# Patient Record
Sex: Female | Born: 1935 | Race: White | Hispanic: No | Smoking: Current every day smoker
Health system: Southern US, Community
[De-identification: ages and names within clinical notes are randomized; demographics above are authoritative.]

## PROBLEM LIST (undated history)

## (undated) DIAGNOSIS — F32A Depression, unspecified: Secondary | ICD-10-CM

## (undated) DIAGNOSIS — K219 Gastro-esophageal reflux disease without esophagitis: Secondary | ICD-10-CM

## (undated) DIAGNOSIS — Z91148 Patient's other noncompliance with medication regimen for other reason: Secondary | ICD-10-CM

## (undated) DIAGNOSIS — Z9114 Patient's other noncompliance with medication regimen: Secondary | ICD-10-CM

## (undated) DIAGNOSIS — I219 Acute myocardial infarction, unspecified: Secondary | ICD-10-CM

## (undated) DIAGNOSIS — Z9981 Dependence on supplemental oxygen: Secondary | ICD-10-CM

## (undated) DIAGNOSIS — R51 Headache: Secondary | ICD-10-CM

## (undated) DIAGNOSIS — J449 Chronic obstructive pulmonary disease, unspecified: Secondary | ICD-10-CM

## (undated) DIAGNOSIS — F329 Major depressive disorder, single episode, unspecified: Secondary | ICD-10-CM

## (undated) DIAGNOSIS — K922 Gastrointestinal hemorrhage, unspecified: Secondary | ICD-10-CM

## (undated) DIAGNOSIS — I639 Cerebral infarction, unspecified: Secondary | ICD-10-CM

## (undated) DIAGNOSIS — N200 Calculus of kidney: Secondary | ICD-10-CM

## (undated) DIAGNOSIS — K649 Unspecified hemorrhoids: Secondary | ICD-10-CM

## (undated) DIAGNOSIS — M159 Polyosteoarthritis, unspecified: Secondary | ICD-10-CM

## (undated) DIAGNOSIS — I5042 Chronic combined systolic (congestive) and diastolic (congestive) heart failure: Secondary | ICD-10-CM

## (undated) DIAGNOSIS — N184 Chronic kidney disease, stage 4 (severe): Secondary | ICD-10-CM

## (undated) DIAGNOSIS — Z8711 Personal history of peptic ulcer disease: Secondary | ICD-10-CM

## (undated) DIAGNOSIS — C801 Malignant (primary) neoplasm, unspecified: Secondary | ICD-10-CM

## (undated) DIAGNOSIS — D649 Anemia, unspecified: Secondary | ICD-10-CM

## (undated) DIAGNOSIS — M1990 Unspecified osteoarthritis, unspecified site: Secondary | ICD-10-CM

## (undated) DIAGNOSIS — I10 Essential (primary) hypertension: Secondary | ICD-10-CM

## (undated) DIAGNOSIS — I2510 Atherosclerotic heart disease of native coronary artery without angina pectoris: Secondary | ICD-10-CM

## (undated) HISTORY — DX: Personal history of peptic ulcer disease: Z87.11

## (undated) HISTORY — PX: CHOLECYSTECTOMY: SHX55

## (undated) HISTORY — DX: Gastrointestinal hemorrhage, unspecified: K92.2

## (undated) HISTORY — DX: Polyosteoarthritis, unspecified: M15.9

## (undated) HISTORY — PX: ABDOMINAL HYSTERECTOMY: SHX81

## (undated) HISTORY — PX: APPENDECTOMY: SHX54

## (undated) HISTORY — DX: Calculus of kidney: N20.0

## (undated) HISTORY — PX: TONSILLECTOMY: SUR1361

## (undated) HISTORY — PX: COLON SURGERY: SHX602

## (undated) HISTORY — PX: EYE SURGERY: SHX253

---

## 1987-06-18 HISTORY — PX: MELANOMA EXCISION: SHX5266

## 2006-10-10 ENCOUNTER — Emergency Department (HOSPITAL_COMMUNITY): Admission: EM | Admit: 2006-10-10 | Discharge: 2006-10-11 | Payer: Self-pay | Admitting: Emergency Medicine

## 2006-10-13 ENCOUNTER — Emergency Department (HOSPITAL_COMMUNITY): Admission: EM | Admit: 2006-10-13 | Discharge: 2006-10-13 | Payer: Self-pay | Admitting: Family Medicine

## 2006-10-20 ENCOUNTER — Ambulatory Visit: Payer: Self-pay | Admitting: Internal Medicine

## 2006-10-21 ENCOUNTER — Encounter (HOSPITAL_BASED_OUTPATIENT_CLINIC_OR_DEPARTMENT_OTHER): Admission: RE | Admit: 2006-10-21 | Discharge: 2006-11-18 | Payer: Self-pay | Admitting: Surgery

## 2006-10-23 ENCOUNTER — Ambulatory Visit (HOSPITAL_COMMUNITY): Admission: RE | Admit: 2006-10-23 | Discharge: 2006-10-23 | Payer: Self-pay | Admitting: Surgery

## 2007-01-03 ENCOUNTER — Inpatient Hospital Stay (HOSPITAL_COMMUNITY): Admission: EM | Admit: 2007-01-03 | Discharge: 2007-01-08 | Payer: Self-pay | Admitting: Emergency Medicine

## 2007-01-05 ENCOUNTER — Encounter (INDEPENDENT_AMBULATORY_CARE_PROVIDER_SITE_OTHER): Payer: Self-pay | Admitting: Internal Medicine

## 2007-01-05 ENCOUNTER — Ambulatory Visit: Payer: Self-pay | Admitting: *Deleted

## 2007-01-21 ENCOUNTER — Ambulatory Visit: Payer: Self-pay | Admitting: Cardiology

## 2008-01-07 IMAGING — CT CT HEAD W/O CM
1 of 2 series · 15 of 30 positions shown, 19 images · IV contrast (agent unspecified)
Comparison: No prior studies are available for comparison.

CLINICAL DATA: Knot on the right side of the head. Very tender.
 HEAD CT WITHOUT CONTRAST:
TECHNIQUE: Contiguous axial images were obtained from the base of the skull through the vertex according to standard protocol without contrast.

[Series 3: recon 2: brain · axial · 0.47mm/px · z∈[+177,+331]mm · 15 of 64 slices shown, 19 images]
[im 4/64  brain]
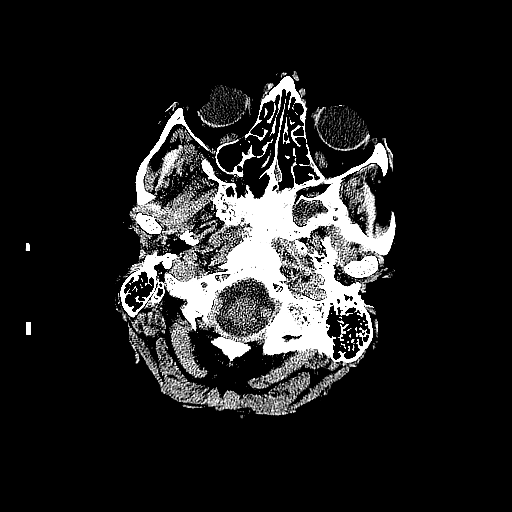
[im 4/64  bone]
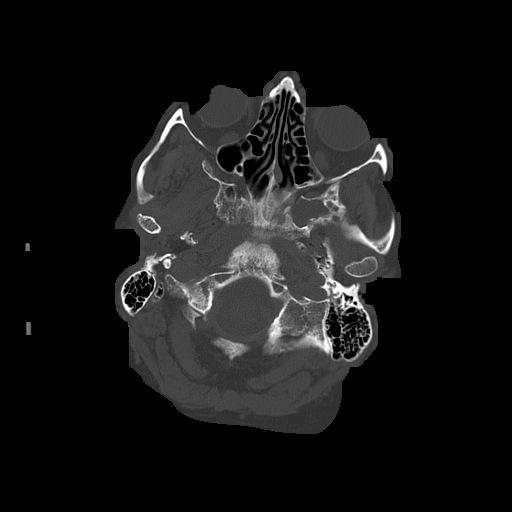
[im 7/64  brain]
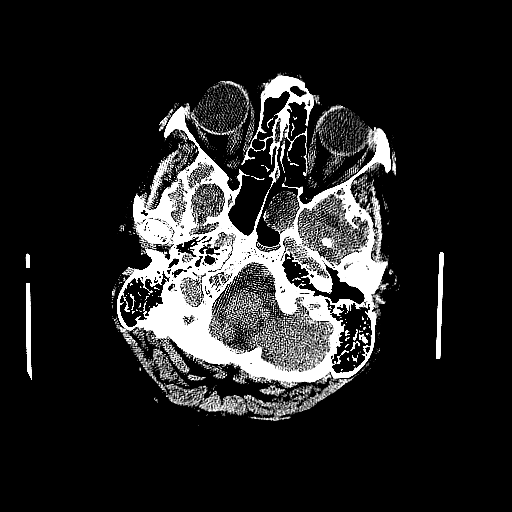
[im 14/64  brain]
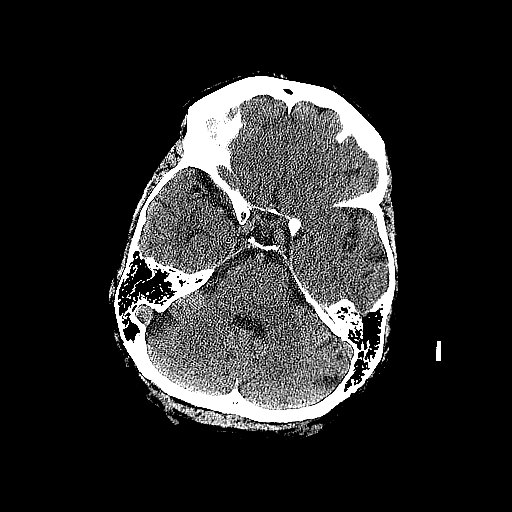
[im 17/64  brain]
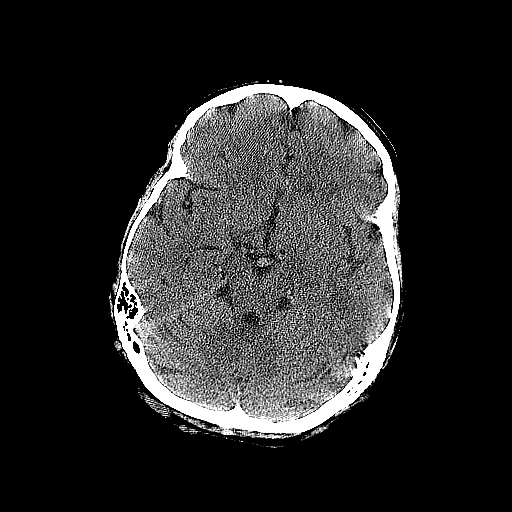
[im 20/64  brain]
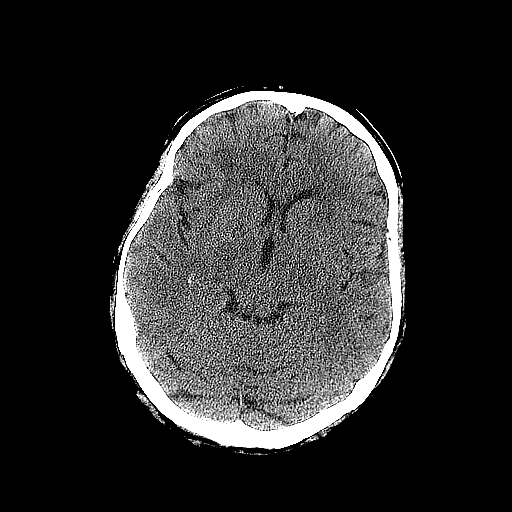
[im 20/64  bone]
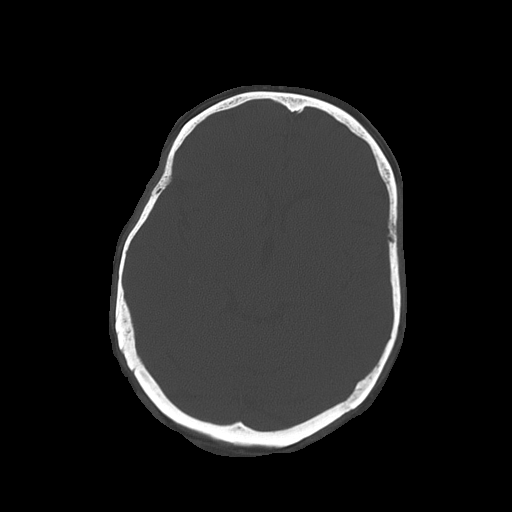
[im 24/64  brain]
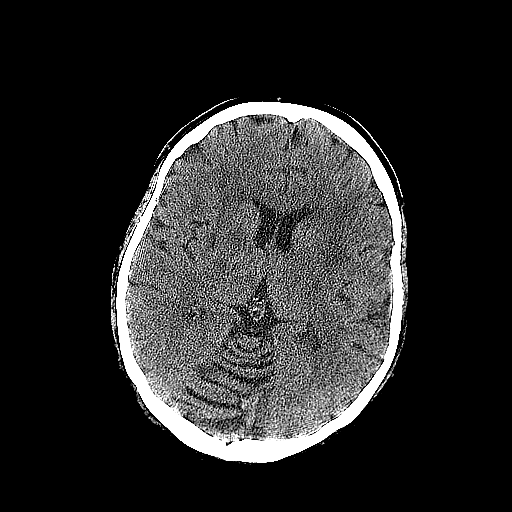
[im 27/64  brain]
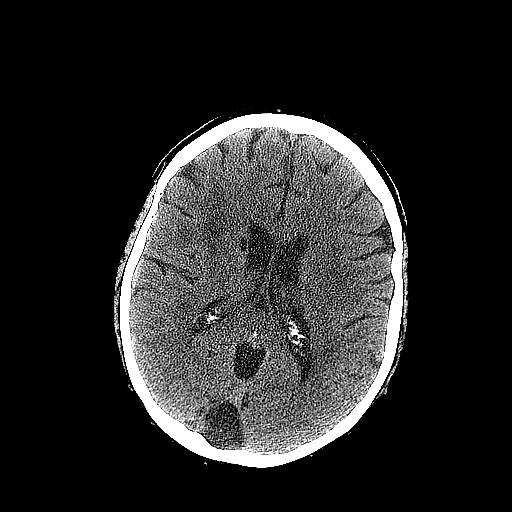
[im 34/64  brain]
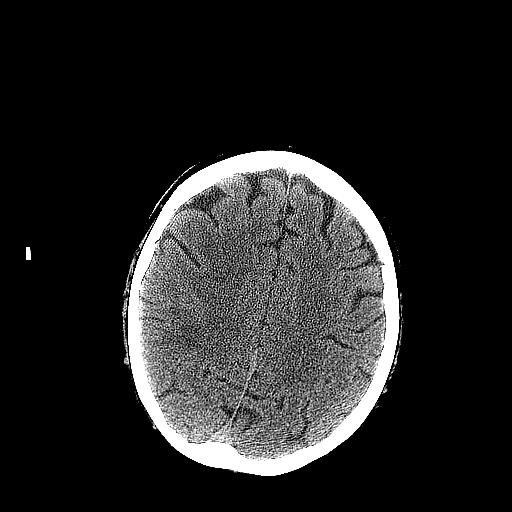
[im 37/64  brain]
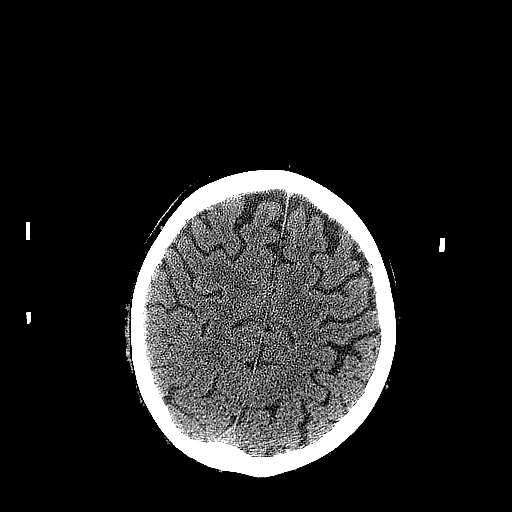
[im 37/64  bone]
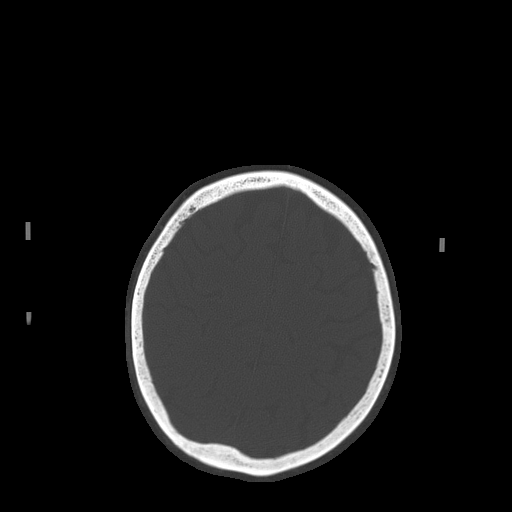
[im 40/64  brain]
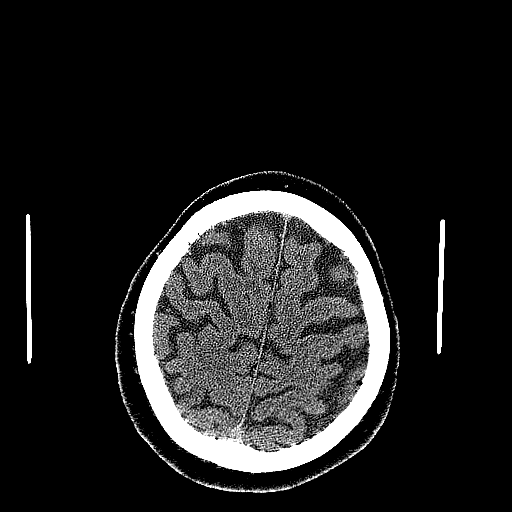
[im 44/64  brain]
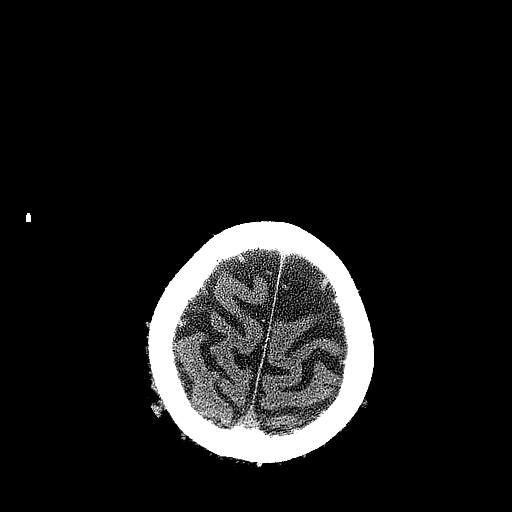
[im 47/64  brain]
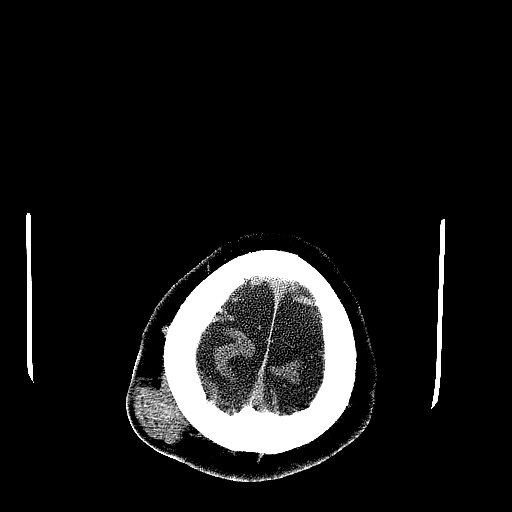
[im 54/64  brain]
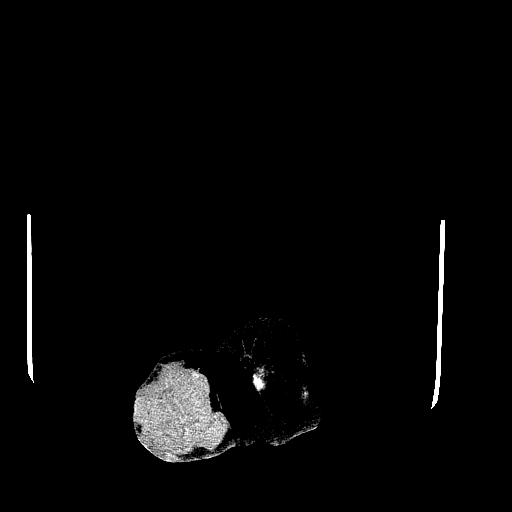
[im 54/64  bone]
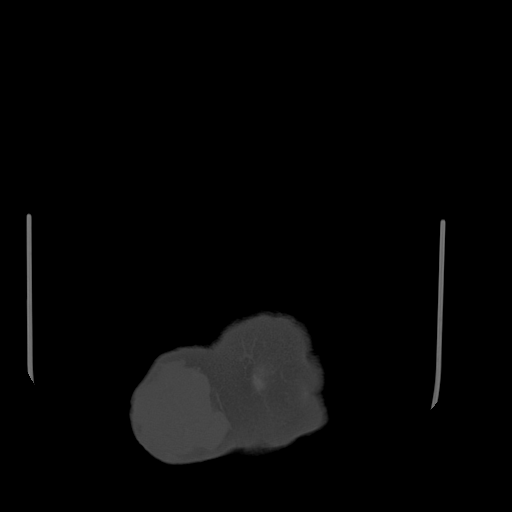
[im 57/64  brain]
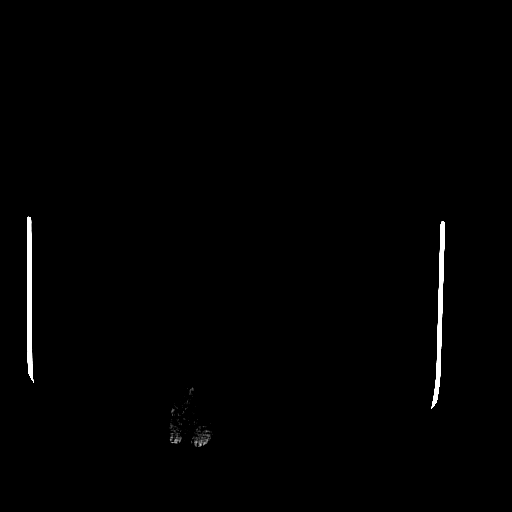
[im 60/64  brain]
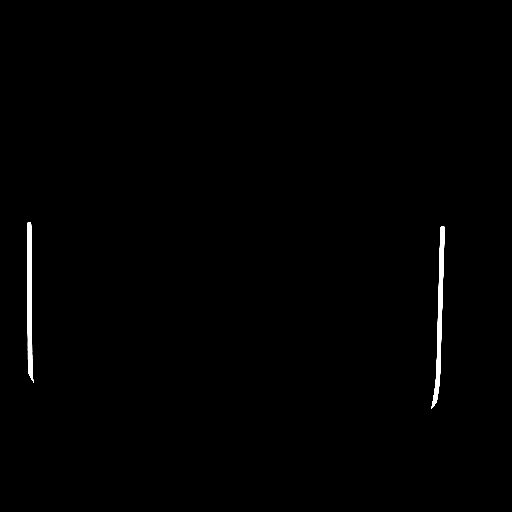

[15 of 30 positions shown; findings below may reference images not displayed]

FINDINGS: There is a 5 cm soft tissue density mass in the scalp over the right posterior parietal region. This appears to be fairly homogeneous. The density is that of soft tissue density and not fluid. There is no sign of internal necrosis. This has a broad surface abutting the skull, but I do not see any abnormality of the skull itself. There are some lymph nodes in the subcutaneous fat of the occipital regions, more on the right than the left. 
 The brain stem and cerebellum are normal. There are old strokes in the body of the caudate on the right and in the right frontal white matter.  There are some minor chronic small vessel changes elsewhere in the hemispheric white matter. There is an old lacune in the right thalamus. No sign of recent intracranial insult. The sinuses are clear.
IMPRESSION: 1.  5 cm soft tissue mass in the subcutaneous tissues of the right posterior parietal region. This is consistent with a neoplastic mass. There is a broad surface along the calvarium, but no sign of actual calvarial involvement. 
 2.  Prominent occipital nodes, more on the right than the left. 
 3.  Chronic small vessel insults affecting the brain. No identifiably acute bony lesion.

## 2010-06-28 ENCOUNTER — Ambulatory Visit: Admit: 2010-06-28 | Payer: Self-pay | Admitting: Internal Medicine

## 2010-07-15 ENCOUNTER — Inpatient Hospital Stay (HOSPITAL_COMMUNITY)
Admission: EM | Admit: 2010-07-15 | Discharge: 2010-07-19 | DRG: 065 | Disposition: A | Discharge status: Home / Self Care | Payer: MEDICARE | Attending: Family Medicine | Admitting: Family Medicine

## 2010-07-15 DIAGNOSIS — E669 Obesity, unspecified: Secondary | ICD-10-CM | POA: Diagnosis present

## 2010-07-15 DIAGNOSIS — I2510 Atherosclerotic heart disease of native coronary artery without angina pectoris: Secondary | ICD-10-CM | POA: Diagnosis present

## 2010-07-15 DIAGNOSIS — Z9861 Coronary angioplasty status: Secondary | ICD-10-CM

## 2010-07-15 DIAGNOSIS — D126 Benign neoplasm of colon, unspecified: Secondary | ICD-10-CM | POA: Diagnosis present

## 2010-07-15 DIAGNOSIS — F17200 Nicotine dependence, unspecified, uncomplicated: Secondary | ICD-10-CM | POA: Diagnosis present

## 2010-07-15 DIAGNOSIS — K219 Gastro-esophageal reflux disease without esophagitis: Secondary | ICD-10-CM | POA: Diagnosis present

## 2010-07-15 DIAGNOSIS — Z8673 Personal history of transient ischemic attack (TIA), and cerebral infarction without residual deficits: Secondary | ICD-10-CM

## 2010-07-15 DIAGNOSIS — I619 Nontraumatic intracerebral hemorrhage, unspecified: Principal | ICD-10-CM | POA: Diagnosis present

## 2010-07-15 DIAGNOSIS — Z85828 Personal history of other malignant neoplasm of skin: Secondary | ICD-10-CM

## 2010-07-15 DIAGNOSIS — M069 Rheumatoid arthritis, unspecified: Secondary | ICD-10-CM | POA: Diagnosis present

## 2010-07-15 DIAGNOSIS — N179 Acute kidney failure, unspecified: Secondary | ICD-10-CM | POA: Diagnosis present

## 2010-07-15 DIAGNOSIS — K922 Gastrointestinal hemorrhage, unspecified: Secondary | ICD-10-CM | POA: Diagnosis present

## 2010-07-15 DIAGNOSIS — D62 Acute posthemorrhagic anemia: Secondary | ICD-10-CM | POA: Diagnosis present

## 2010-07-15 DIAGNOSIS — I10 Essential (primary) hypertension: Secondary | ICD-10-CM | POA: Diagnosis present

## 2010-07-15 DIAGNOSIS — Z7982 Long term (current) use of aspirin: Secondary | ICD-10-CM

## 2010-07-15 DIAGNOSIS — I498 Other specified cardiac arrhythmias: Secondary | ICD-10-CM | POA: Diagnosis present

## 2010-07-15 DIAGNOSIS — I672 Cerebral atherosclerosis: Secondary | ICD-10-CM | POA: Diagnosis present

## 2010-07-15 DIAGNOSIS — K5730 Diverticulosis of large intestine without perforation or abscess without bleeding: Secondary | ICD-10-CM | POA: Diagnosis present

## 2010-07-15 DIAGNOSIS — Z9849 Cataract extraction status, unspecified eye: Secondary | ICD-10-CM

## 2010-07-15 LAB — CBC
HCT: 38 % (ref 36.0–46.0)
HCT: 33.9 % — ABNORMAL LOW (ref 36.0–46.0)
Hemoglobin: 11.6 g/dL — ABNORMAL LOW (ref 12.0–15.0)
MCHC: 30.5 g/dL (ref 30.0–36.0)
MCHC: 31 g/dL (ref 30.0–36.0)
MCV: 84.3 fL (ref 78.0–100.0)
Platelets: 226 10*3/uL (ref 150–400)
RBC: 4.5 MIL/uL (ref 3.87–5.11)
RDW: 15.2 % (ref 11.5–15.5)
WBC: 9.6 10*3/uL (ref 4.0–10.5)
WBC: 8 10*3/uL (ref 4.0–10.5)

## 2010-07-15 LAB — DIFFERENTIAL
Basophils Absolute: 0 10*3/uL (ref 0.0–0.1)
Basophils Absolute: 0 10*3/uL (ref 0.0–0.1)
Basophils Relative: 0 % (ref 0–1)
Eosinophils Absolute: 0.1 10*3/uL (ref 0.0–0.7)
Eosinophils Relative: 2 % (ref 0–5)
Lymphocytes Relative: 11 % — ABNORMAL LOW (ref 12–46)
Lymphocytes Relative: 17 % (ref 12–46)
Lymphs Abs: 1.3 10*3/uL (ref 0.7–4.0)
Monocytes Absolute: 0.6 10*3/uL (ref 0.1–1.0)
Monocytes Absolute: 0.4 10*3/uL (ref 0.1–1.0)
Neutro Abs: 7.8 10*3/uL — ABNORMAL HIGH (ref 1.7–7.7)
Neutrophils Relative %: 81 % — ABNORMAL HIGH (ref 43–77)

## 2010-07-15 LAB — BASIC METABOLIC PANEL
CO2: 24 mEq/L (ref 19–32)
Chloride: 102 mEq/L (ref 96–112)
Potassium: 3.9 mEq/L (ref 3.5–5.1)
Sodium: 136 mEq/L (ref 135–145)

## 2010-07-15 LAB — PROTIME-INR
INR: 0.98 (ref 0.00–1.49)
Prothrombin Time: 13.2 seconds (ref 11.6–15.2)

## 2010-07-15 LAB — ABO/RH: ABO/RH(D): A POS

## 2010-07-16 DIAGNOSIS — K922 Gastrointestinal hemorrhage, unspecified: Secondary | ICD-10-CM

## 2010-07-16 DIAGNOSIS — I6789 Other cerebrovascular disease: Secondary | ICD-10-CM

## 2010-07-16 LAB — HEPATIC FUNCTION PANEL
ALT: 50 U/L — ABNORMAL HIGH (ref 0–35)
Bilirubin, Direct: 0.2 mg/dL (ref 0.0–0.3)
Indirect Bilirubin: 0.3 mg/dL (ref 0.3–0.9)
Total Protein: 5.1 g/dL — ABNORMAL LOW (ref 6.0–8.3)

## 2010-07-16 LAB — DIFFERENTIAL
Eosinophils Absolute: 0.3 10*3/uL (ref 0.0–0.7)
Eosinophils Absolute: 0.3 10*3/uL (ref 0.0–0.7)
Eosinophils Relative: 4 % (ref 0–5)
Lymphocytes Relative: 23 % (ref 12–46)
Lymphocytes Relative: 20 % (ref 12–46)
Lymphs Abs: 1.7 10*3/uL (ref 0.7–4.0)
Lymphs Abs: 1.6 10*3/uL (ref 0.7–4.0)
Monocytes Absolute: 0.6 10*3/uL (ref 0.1–1.0)
Monocytes Relative: 7 % (ref 3–12)
Neutro Abs: 5.5 10*3/uL (ref 1.7–7.7)
Neutrophils Relative %: 69 % (ref 43–77)

## 2010-07-16 LAB — CBC
HCT: 30.5 % — ABNORMAL LOW (ref 36.0–46.0)
HCT: 30.3 % — ABNORMAL LOW (ref 36.0–46.0)
Hemoglobin: 9.4 g/dL — ABNORMAL LOW (ref 12.0–15.0)
MCH: 25.6 pg — ABNORMAL LOW (ref 26.0–34.0)
MCH: 26.2 pg (ref 26.0–34.0)
MCHC: 30.5 g/dL (ref 30.0–36.0)
MCV: 84 fL (ref 78.0–100.0)
MCV: 84.4 fL (ref 78.0–100.0)
Platelets: 236 10*3/uL (ref 150–400)
RBC: 3.59 MIL/uL — ABNORMAL LOW (ref 3.87–5.11)
RDW: 15.4 % (ref 11.5–15.5)

## 2010-07-16 LAB — IRON: Iron: 35 ug/dL — ABNORMAL LOW (ref 42–135)

## 2010-07-16 LAB — BASIC METABOLIC PANEL
BUN: 32 mg/dL — ABNORMAL HIGH (ref 6–23)
Calcium: 7.8 mg/dL — ABNORMAL LOW (ref 8.4–10.5)
Creatinine, Ser: 2.55 mg/dL — ABNORMAL HIGH (ref 0.4–1.2)
GFR calc non Af Amer: 18 mL/min — ABNORMAL LOW (ref >60)

## 2010-07-17 DIAGNOSIS — K922 Gastrointestinal hemorrhage, unspecified: Secondary | ICD-10-CM

## 2010-07-17 DIAGNOSIS — I6350 Cerebral infarction due to unspecified occlusion or stenosis of unspecified cerebral artery: Secondary | ICD-10-CM

## 2010-07-17 DIAGNOSIS — D62 Acute posthemorrhagic anemia: Secondary | ICD-10-CM

## 2010-07-17 LAB — PREPARE RBC (CROSSMATCH)

## 2010-07-17 LAB — URINALYSIS, ROUTINE W REFLEX MICROSCOPIC
Bilirubin Urine: NEGATIVE
Ketones, ur: NEGATIVE mg/dL
Nitrite: NEGATIVE
Specific Gravity, Urine: 1.016 (ref 1.005–1.030)
pH: 5 (ref 5.0–8.0)

## 2010-07-17 LAB — CBC
HCT: 27.9 % — ABNORMAL LOW (ref 36.0–46.0)
MCH: 26.1 pg (ref 26.0–34.0)
MCH: 26.1 pg (ref 26.0–34.0)
MCHC: 31.6 g/dL (ref 30.0–36.0)
MCV: 83.8 fL (ref 78.0–100.0)
Platelets: 209 10*3/uL (ref 150–400)
Platelets: 241 10*3/uL (ref 150–400)
RDW: 15.3 % (ref 11.5–15.5)
RDW: 15.3 % (ref 11.5–15.5)

## 2010-07-17 LAB — DIFFERENTIAL
Basophils Absolute: 0 10*3/uL (ref 0.0–0.1)
Basophils Relative: 0 % (ref 0–1)
Eosinophils Absolute: 0.4 10*3/uL (ref 0.0–0.7)
Eosinophils Absolute: 0.3 10*3/uL (ref 0.0–0.7)
Eosinophils Relative: 5 % (ref 0–5)
Eosinophils Relative: 4 % (ref 0–5)
Lymphocytes Relative: 22 % (ref 12–46)
Lymphs Abs: 1.6 10*3/uL (ref 0.7–4.0)
Monocytes Absolute: 0.6 10*3/uL (ref 0.1–1.0)
Monocytes Absolute: 0.6 10*3/uL (ref 0.1–1.0)
Monocytes Relative: 8 % (ref 3–12)
Monocytes Relative: 7 % (ref 3–12)
Neutro Abs: 5.4 10*3/uL (ref 1.7–7.7)

## 2010-07-17 LAB — URINE MICROSCOPIC-ADD ON

## 2010-07-17 LAB — BASIC METABOLIC PANEL
BUN: 30 mg/dL — ABNORMAL HIGH (ref 6–23)
Creatinine, Ser: 2.59 mg/dL — ABNORMAL HIGH (ref 0.4–1.2)
GFR calc non Af Amer: 18 mL/min — ABNORMAL LOW (ref >60)
Glucose, Bld: 99 mg/dL (ref 70–99)

## 2010-07-17 LAB — SODIUM, URINE, RANDOM: Sodium, Ur: 34 mEq/L

## 2010-07-17 LAB — CREATININE, URINE, RANDOM: Creatinine, Urine: 93.9 mg/dL

## 2010-07-17 LAB — TSH: TSH: 0.998 u[IU]/mL (ref 0.350–4.500)

## 2010-07-18 ENCOUNTER — Encounter: Payer: Self-pay | Admitting: Gastroenterology

## 2010-07-18 DIAGNOSIS — D126 Benign neoplasm of colon, unspecified: Secondary | ICD-10-CM

## 2010-07-18 DIAGNOSIS — I6789 Other cerebrovascular disease: Secondary | ICD-10-CM

## 2010-07-18 DIAGNOSIS — K625 Hemorrhage of anus and rectum: Secondary | ICD-10-CM

## 2010-07-18 DIAGNOSIS — K922 Gastrointestinal hemorrhage, unspecified: Secondary | ICD-10-CM

## 2010-07-18 DIAGNOSIS — K5731 Diverticulosis of large intestine without perforation or abscess with bleeding: Secondary | ICD-10-CM

## 2010-07-18 HISTORY — PX: COLONOSCOPY: SHX174

## 2010-07-18 LAB — DIFFERENTIAL
Basophils Absolute: 0 10*3/uL (ref 0.0–0.1)
Basophils Absolute: 0 10*3/uL (ref 0.0–0.1)
Basophils Relative: 0 % (ref 0–1)
Basophils Relative: 0 % (ref 0–1)
Eosinophils Absolute: 0.4 10*3/uL (ref 0.0–0.7)
Eosinophils Absolute: 0.4 10*3/uL (ref 0.0–0.7)
Lymphocytes Relative: 19 % (ref 12–46)
Monocytes Relative: 9 % (ref 3–12)
Monocytes Relative: 6 % (ref 3–12)
Neutro Abs: 4.3 10*3/uL (ref 1.7–7.7)
Neutrophils Relative %: 62 % (ref 43–77)
Neutrophils Relative %: 70 % (ref 43–77)

## 2010-07-18 LAB — CBC
Hemoglobin: 10 g/dL — ABNORMAL LOW (ref 12.0–15.0)
Hemoglobin: 11.2 g/dL — ABNORMAL LOW (ref 12.0–15.0)
MCH: 27 pg (ref 26.0–34.0)
Platelets: 214 10*3/uL (ref 150–400)
Platelets: 246 10*3/uL (ref 150–400)
RBC: 3.71 MIL/uL — ABNORMAL LOW (ref 3.87–5.11)
RBC: 4.21 MIL/uL (ref 3.87–5.11)
WBC: 6.9 10*3/uL (ref 4.0–10.5)
WBC: 7.6 10*3/uL (ref 4.0–10.5)

## 2010-07-18 LAB — TYPE AND SCREEN
ABO/RH(D): A POS
Antibody Screen: NEGATIVE
Unit division: 0

## 2010-07-18 LAB — BASIC METABOLIC PANEL
CO2: 25 mEq/L (ref 19–32)
Chloride: 108 mEq/L (ref 96–112)
Creatinine, Ser: 2.34 mg/dL — ABNORMAL HIGH (ref 0.4–1.2)
GFR calc Af Amer: 25 mL/min — ABNORMAL LOW (ref >60)
Sodium: 142 mEq/L (ref 135–145)

## 2010-07-19 ENCOUNTER — Other Ambulatory Visit: Payer: Self-pay | Admitting: Gastroenterology

## 2010-07-19 DIAGNOSIS — K5731 Diverticulosis of large intestine without perforation or abscess with bleeding: Secondary | ICD-10-CM

## 2010-07-19 LAB — DIFFERENTIAL
Lymphocytes Relative: 19 % (ref 12–46)
Lymphs Abs: 1.5 10*3/uL (ref 0.7–4.0)
Monocytes Relative: 7 % (ref 3–12)
Neutrophils Relative %: 69 % (ref 43–77)

## 2010-07-19 LAB — CBC
HCT: 32.6 % — ABNORMAL LOW (ref 36.0–46.0)
Hemoglobin: 10.2 g/dL — ABNORMAL LOW (ref 12.0–15.0)
MCV: 83.6 fL (ref 78.0–100.0)
RBC: 3.9 MIL/uL (ref 3.87–5.11)
WBC: 7.6 10*3/uL (ref 4.0–10.5)

## 2010-07-21 NOTE — Consult Note (Signed)
NAMEJAVONDA, SUH NO.:  [id number]  MEDICAL RECORD NO.:  [id number]          PATIENT TYPE:  INP  LOCATION:  3019                         FACILITY:  [facility]  PHYSICIAN:  Thana Farr, MD    DATE OF BIRTH:  Aug 26, 1934  DATE OF CONSULTATION:  07/15/2010 DATE OF DISCHARGE:                                CONSULTATION   CONSULTATION CALLED BY:  Zadie Rhine, MD  HISTORY OF PRESENT ILLNESS:  Mr. Chambless is a 76 year old female presenting with an acute GI bleed.  She has a history of multiple strokes in the past.  For the past 2-3 days, she has had slurred speech. Slurred speech did not clear and apparently presented for evaluation. The patient has a history of stroke in 2008 at which time she presented with slurred speech as well.  She was found to have a left centrum semiovale infarct at that time.  The patient had strokes previous to this event as well.  Head CT on this evaluation shows an acute left basal ganglion hemorrhage as well as a left frontal infarct that is new compared to the previous imaging of 2008.  PAST MEDICAL HISTORY: 1. Coronary artery disease, status post stent placement. 2. Hypertension. 3. Diabetes. 4. GI cancer. 5. Skin cancer. 6. Diabetes. 7. Hypertension. 8. Rheumatoid arthritis.  SOCIAL HISTORY:  The patient smokes.  She smokes a pack a day.  She has no history of alcohol or illicit drug abuse.  MEDICATIONS:  Amlodipine, Plavix, aspirin, benazepril, clonidine, and nitroglycerin.  PHYSICAL EXAMINATION:  Blood pressure 173/83, heart rate 89, respiratory rate 80, and temperature 97.87.  On mental status testing, the patient is alert.  She can follow commands without difficulty.  Speech is slurred with some word-finding difficulties.  On cranial nerve testing, II, disk flat bilaterally.  The patient has a restricted left visual field.  In III, IV, and VI, extraocular movements are intact.  Pupils are reactive bilaterally.   V and VII, decreased right nasolabial fold. VIII, grossly intact.  IX and X, decreased gag.  XI, bilateral shoulder shrug.  XII, midline tongue extension.  On motor exam, the patient is 5/ in5 the bilateral upper extremities and the left lower extremity.  The patient is 4+/5 strength on the right lower extremity.  Sensory exam is intact bilaterally.  Deep tendon reflexes are 1+ in the upper extremities and absent in lower extremities.  Plantars are mute bilaterally.  On cerebellar testing, finger-to-nose and heel-to-shin intact.  LABORATORY TESTS:  Sodium 136, potassium 3.9, chloride 102, CO2 of 24, BUN 28, creatinine 2.3, glucose 136, and calcium 8.2.  Hemoglobin and hematocrit 11.6 and 38.0, white blood cell count 9.6, and platelet count 267.  PT and INR 13.2 and 0.98 respectively.  ASSESSMENT:  Ms. Proby is a 76 year old female with left basal ganglion hemorrhage and a new stroke in the left frontal lobe.  The patient's infarcts from review of her old records have been left hemispheric as well and the patient has a history of a left M2 occlusion.  She continues to smoke.  She reports she has not been taking her Plavix.  She also has  a GI bleed as well.  She will not be able to go on any anticoagulation at this time.  We will need to review the status of her vessels as well.  Follow up on imaging is necessary.  PLAN: 1. The patient will have follow up CT on July 16, 2010.  The     hemorrhage is not large enough or causing enough of an issue to be     considered a surgical candidate at this time. 2. Echo and carotids.          ______________________________ Thana Farr, MD     LR/MEDQ  D:  07/15/2010  T:  07/16/2010  Job:  [id number]  Electronically Signed by Thana Farr MD on 07/21/2010 09:03:42 AM

## 2010-07-24 ENCOUNTER — Encounter: Payer: Self-pay | Admitting: Internal Medicine

## 2010-07-24 ENCOUNTER — Encounter: Payer: Self-pay | Admitting: Gastroenterology

## 2010-07-24 ENCOUNTER — Ambulatory Visit (INDEPENDENT_AMBULATORY_CARE_PROVIDER_SITE_OTHER): Payer: MEDICARE | Admitting: Internal Medicine

## 2010-07-24 DIAGNOSIS — I6350 Cerebral infarction due to unspecified occlusion or stenosis of unspecified cerebral artery: Secondary | ICD-10-CM | POA: Insufficient documentation

## 2010-07-24 DIAGNOSIS — N200 Calculus of kidney: Secondary | ICD-10-CM | POA: Insufficient documentation

## 2010-07-24 DIAGNOSIS — I2510 Atherosclerotic heart disease of native coronary artery without angina pectoris: Secondary | ICD-10-CM | POA: Insufficient documentation

## 2010-07-24 DIAGNOSIS — M159 Polyosteoarthritis, unspecified: Secondary | ICD-10-CM | POA: Insufficient documentation

## 2010-07-24 DIAGNOSIS — Z8711 Personal history of peptic ulcer disease: Secondary | ICD-10-CM | POA: Insufficient documentation

## 2010-07-24 DIAGNOSIS — I10 Essential (primary) hypertension: Secondary | ICD-10-CM

## 2010-07-24 DIAGNOSIS — K922 Gastrointestinal hemorrhage, unspecified: Secondary | ICD-10-CM | POA: Insufficient documentation

## 2010-07-25 NOTE — Procedures (Signed)
Summary: Colonoscopy  Patient: [name] Note: All result statuses are Final unless otherwise noted.  Tests: (1) Colonoscopy (COL)   COL Colonoscopy           DONE     [facility]     [address]     [place], [place]  [address]           COLONOSCOPY PROCEDURE REPORT           PATIENT:  [name], [name]  MR#:  [id number]     BIRTHDATE:  August 19, 1934, 75 yrs. old  GENDER:  female     ENDOSCOPIST:  Barbette Hair. Arlyce Dice, MD     REF. BY:     PROCEDURE DATE:  07/18/2010     PROCEDURE:  Colonoscopy with snare polypectomy, Colon with cold     biopsy polypectomy     ASA CLASS:  Class III     INDICATIONS:  rectal bleeding     MEDICATIONS:   Fentanyl 50 mcg, Versed 4 mg IV           DESCRIPTION OF PROCEDURE:   After the risks benefits and     alternatives of the procedure were thoroughly explained, informed     consent was obtained.  No rectal exam performed. The [id number]     ([id number]) endoscope was introduced through the anus and advanced     to the cecum, which was identified by both the appendix and     ileocecal valve, without limitations.  The quality of the prep was     excellent, using MoviPrep.  The instrument was then slowly     withdrawn as the colon was fully examined.     <<PROCEDUREIMAGES>>           FINDINGS:  Scattered diverticula were found. Diffuse     diverticulosis from sigmoid to proximal transverse colon. No blood     (see image002, image010, and image011).  Two polyps were found in     the descending colon (see image008). 3 and 4mm sessile polyps     removed with cold polypectomy snare, submitted to pathology  This     was otherwise a normal examination of the colon (see image003,     image005, and image013).  A sessile polyp was found in the sigmoid     colon. It was 2 mm in size. It was found 10 cm from the point of     entry. The polyp was removed using cold biopsy forceps (see     image012).   Retroflexed views in the rectum revealed  no     abnormalities.    The scope was then withdrawn from the patient     and the procedure completed.           COMPLICATIONS:  None     ENDOSCOPIC IMPRESSION:     1) Diverticula, scattered     2) Two polyps in the descending colon     3) 2 mm sessile polyp in the sigmoid colon     4) Otherwise normal examination           Acute lower GI bleed secondary to diverticula     RECOMMENDATIONS:     1) Await biopsy results for f/u recommendations     2) No further GI workup at this time     REPEAT EXAM:  No           ______________________________     Molly Maduro  Rosalio Macadamia, MD           CC:           n.     eSIGNED:   Barbette Hair. Zamauri Nez at 07/18/2010 04:54 PM           Johnsie Cancel, [id number]  Note: An exclamation mark (!) indicates a result that was not dispersed into the flowsheet. Document Creation Date: 07/18/2010 4:54 PM _______________________________________________________________________  (1) Order result status: Final Collection or observation date-time: 07/18/2010 16:42 Requested date-time:  Receipt date-time:  Reported date-time:  Referring Physician:   Ordering Physician: Melvia Heaps [phone]) Specimen Source:  Source: Launa Grill Order Number: [id number] Lab site:

## 2010-07-27 NOTE — H&P (Signed)
[name], [name]            ACCOUNT NO.:  [id number]  MEDICAL RECORD NO.:  [id number]          PATIENT TYPE:  INP  LOCATION:  1832                         FACILITY:  [facility]  PHYSICIAN:  Nestor Ramp, MD        DATE OF BIRTH:  10-Oct-1934  DATE OF ADMISSION:  07/15/2010 DATE OF DISCHARGE:                             HISTORY & PHYSICAL   PRIMARY CARE PHYSICIAN:  Unassigned.  CHIEF COMPLAINT:  Stroke, lower GI bleed, and acute renal failure.  HISTORY OF PRESENT ILLNESS:  This is a 76 year old female with past medical history of CVA, hypertension as well CAD status post stents presented with dysarthria and bright red blood per rectum as well as noted acute renal failure at creatinine of 2.83 (level 5 caveat secondary to baseline dysarthria).  The patient states she noticed worsening of her dysarthria over the last 3 days ago.  When accessed to why the patient did not come in, the patient states "because I just did not."  The patient is unsure whether she had weakness associated with this episode.  The patient does report intermittent headache.  The patient denies any vision changes.  The patient states she had been taking aspirin, Plavix daily since first CVA in 2008.  The patient has not been taken her daily BP meds since 2008 per patient.  When asked, the patient states "because I just did not want to."  The patient denies any current PCP that she goes to.  The patient denies any chest pain or dyspnea.  The patient also reports a history of bright red blood per rectum x1 day.  The patient was noted having an episode of urgent bowel movement today.  The patient then went to the bathroom.  She noticed "a lot of blood in the toilet."  The patient denies any recent strenuous activity, any history of maroon stools or bright red blood per rectum.  The patient denies any abdominal pain, nausea, vomiting, or dysuria.  The patient denies any ibuprofen or Goody powder use  recently.  ALLERGIES:  NKDA.  PAST MEDICAL HISTORY: 1. History of CVA, left central semiovale. 2. Hypertension. 3. GERD. 4. Sinus bradycardia. 5. Status post surgery for a neoplasm of the colon. 6. CAD status post stents.  MEDICATIONS: 1. Aspirin 325. 2. Plavix 75 daily. 3. Norvasc. 4. Benazepril. 5. Clonidine.  PAST SURGICAL HISTORY: 1. Cataract surgery in 2006. 2. Cholecystectomy in 1986. 3. Total abdominal hysterectomy. 4. Subtotal gastrectomy secondary to cancer status post chemotherapy.  SOCIAL HISTORY:  The patient currently lives with her son, [name], his phone number [phone].  The patient is currently retired. The patient reports smoking 1/2-pack per day of cigarettes.  No alcohol or other drug use per the patient.  FAMILY HISTORY:  Noncontributory.  REVIEW OF SYSTEMS:  Negative except as noted above in HPI.  PHYSICAL EXAMINATION:  VITAL SIGNS:  Temperature 97.8, heart rate 80-89, respirations 18-20, blood pressure 170s/80-90s, saturating 96% on room air. GENERAL:  The patient is up in bed in minimal distress. HEENT:  Normocephalic, atraumatic.  Extraocular movements intact. Pupils equally round and reactive to light.  No scleral  icterus. NECK:  Large neck girth.  No tenderness to palpation. CARDIOVASCULAR:  Regular rate and rhythm.  No rubs, gallops, or murmurs auscultated. LUNGS:  Clear to auscultation bilaterally.  No wheezes, rales, or rhonchi. ABDOMEN:  Obese, positive tenderness on palpation in lower abdomen diffusely. RECTAL:  No external hemorrhoids.  Positive maroon-colored stools on anoscopy.  No lesions.  The patient with noted bloody stools around rectum. EXTREMITIES:  2+ peripheral pulses.  Trace edema. NEURO:  The patient is alert and oriented x3 with a baseline dysarthria. Cranial nerves II-XII grossly intact. MUSCULOSKELETAL:  3-4/5 strength diffusely.  LABORATORY STUDIES: 1. CBC:  White count 9.6, hemoglobin 11.6, and  platelet count 267. 2. BMET:  Sodium 136, potassium 3.9, chloride 102, CO2 24, BUN 28,     creatinine 2.38, glucose 136, and calcium 8.2. 3. INR is 0.98. 4. Head CT showing acute punctate hemorrhage in left basal ganglia     with question of hemorrhagic origin, left frontal nonhemorrhagic     acute infarct, stable atrophy, small-vessel ischemic changes, old     infarcts, chronic left sphenoid sinusitis, and right maxillary     sinus disease,  ASSESSMENT AND PLAN:  This is a 76 year old female with past medical history of cerebrovascular accident, colon cancer status post colectomy as well as coronary artery disease status post hence presenting with an acute left-sided stroke, lower gastrointestinal bleed as well as renal failure. 1. Stroke.  The patient is overall a very poor historian with     unsuccessful attempts at contacting primary contact being her son.     Therefore, history obtained from prior medical records.  The     patient with noted hemorrhagic and nonhemorrhagic stroke on head     CT.  Neurology consult in the emergency department.  We will     proceed with modified stroke panel, stroke protocol including     MRI/MRA of the head as well as 2-D echo.  We will hold on     antiplatelet medication in the setting of ongoing gastrointestinal     bleed.  We will hold  on blood pressure correction with parameters     for no greater than 25% increase in blood pressure over the next 24     hours given stroke. 2. Gastrointestinal bleed.  Differential diagnosis is somewhat broad,     however, does include diverticular disease versus acute colon     cancer recurrence.  There were no active distal rectal lesions     noted on anoscopy.  The patient with documented history of colon     cancer status post colectomy.  We will check serial CBCs as well as     a retic count.  The patient was typed and screened in the emergency     department.  We will consult Gastroenterology for likely      colonoscopy as well as give the patient high-dose PPI.  We will     avoid antiplatelet agents. 3. Renal failure.  The patient with noted admission creatinine at     2.38, unsure if this is an acute or chronic process.  The patient     does report her urine output is at baseline.  BUN and creatinine     ratio is not indicative of dehydration as well as the patient noted     to be overall euvolemic on exam.  However, we will hydrate the     patient with normal saline 125 an hour as well  as check a UA and     check a FENA and we will closely watch fluid status overall     overnight. 4. Hypertension.  The patient reports being off blood pressure     medications since 2008.  The patient denies having a primary care     provider to follow up with.  We will follow up blood pressures     closely with goal in keeping blood pressures within 24 hours of     admission briefly as to avoid cerebral hypoperfusion. 5. Coronary artery disease.  The patient had reported a history of     coronary artery disease status post stent placement, no chest pain     or dyspnea on exam.  We will obtain EKG as well as echocardiogram     for evaluation of cardiac status.  We will hold on antiplatelet     treatment given gastrointestinal bleed.  If hypertensive treatment     is necessary, we will likely start a very low-dose beta blocker     (possibly Coreg). 6. Fluids, electrolytes, nutrition, and gastrointestinal.  N.p.o.     pending swallow evaluation.  The patient is noted to have a type 3     dysphagia diet in the past.  This will be continued if the patient     passes swallow evaluation with the patient being an overall carb     modified low sodium diet and we will also formally consult Speech     Therapy for formal evaluation given the patient's history of     dysphagia.  Normal saline at 125 an hour. 7. Prophylaxis.  Sequential compression devices and Protonix. 8. Disposition, pending further  evaluation.     Doree Albee, MD   ______________________________ Nestor Ramp, MD    SN/MEDQ  D:  07/15/2010  T:  07/15/2010  Job:  [id number]  Electronically Signed by Doree Albee  on 07/16/2010 05:33:21 AM Electronically Signed by Denny Levy MD on 07/27/2010 10:09:27 AM

## 2010-08-02 NOTE — Letter (Signed)
Summary: Patient Notice- Polyp Results  [facility]  [address] [place], [place] [address]   Phone: [phone]  Fax: [phone]        July 24, 2010 MRN: [id number]    [facility] [address] [place], [place]  [address]    Dear Ms. Suit,  I am pleased to inform you that the colon polyp(s) removed during your recent colonoscopy was (were) found to be benign (no cancer detected) upon pathologic examination.  I recommend you have a repeat colonoscopy examination in _5 years to look for recurrent polyps, as having colon polyps increases your risk for having recurrent polyps or even colon cancer in the future.  Should you develop new or worsening symptoms of abdominal pain, bowel habit changes or bleeding from the rectum or bowels, please schedule an evaluation with either your primary care physician or with me.  Additional information/recommendations:  __ No further action with gastroenterology is needed at this time. Please      follow-up with your primary care physician for your other healthcare      needs.  __ Please call [phone] to schedule a return visit to review your      situation.  __ Please keep your follow-up visit as already scheduled.  _x_ Continue treatment plan as outlined the day of your exam.  Please call us if you are having persistent problems or have questions about your condition that have not been fully answered at this time.  Sincerely,  Louis Meckel MD  This letter has been electronically signed by your physician.  Appended Document: Patient Notice- Polyp Results Letter is mailed to the patient's home address

## 2010-08-02 NOTE — Assessment & Plan Note (Signed)
Summary: NEW PT/SECURE HORIZONS/#/LB   Vital Signs:  Patient profile:   76 year old female Height:      65 inches Weight:      218 pounds BMI:     36.41 O2 Sat:      98 % on Room air Temp:     97.4 degrees F oral Pulse rate:   60 / minute BP sitting:   152 / 90  (left arm) Cuff size:   large  Vitals Entered By: Bill Salinas CMA (July 24, 2010 1:39 PM)  O2 Flow:  Room air CC: new pt here to est care with primary md/ ab   Primary Care Provider:  Illene Regulus  CC:  new pt here to est care with primary md/ ab.  History of Present Illness: [name] is a 76 y/o single woman who presents as a new patient to primary care. She was hospitalized Jan 29 - Jul 19, 2010 at [facility] for a CVA - hosptial records reveiwed. She had an acute small punctate hemorrahage in the left basal ganglia, left frontal region in a setting of small vessel ischemic disease and old lacunar infarct. She did have a stroke in '08. She has a long h/o hypertension but she stopped all her medications in '09. Since her hospitalization she is back on medication.  Her speech is dysathric and she is a difficult historian. Much information is from the hopsital records and from old cardiology notes.   Today she is feeling well and her main complaints is that she has a gait problem, she is dysarthric and she has hypertrophic toe nails which she cannot cut. She declines any offer of home health PT.   Preventive Screening-Counseling & Management  Alcohol-Tobacco     Alcohol drinks/day: 0     Smoking Status: current     Packs/Day: 1.0  Caffeine-Diet-Exercise     Caffeine use/day: 6 cups per day  Hep-HIV-STD-Contraception     Dental Visit-last 6 months no     Sun Exposure-Excessive: no  Safety-Violence-Falls     Seat Belt Use: yes     Helmet Use: n/a     Firearms in the Home: no firearms in the home     Smoke Detectors: yes     Violence in the Home: no risk noted     Sexual Abuse: no     Fall Risk: pt is a  fall risk      Blood Transfusions:  yes and after 2001.    Current Medications (verified): 1)  Amlodipine Besylate 5 Mg Tabs (Amlodipine Besylate) .Marland Kitchen.. 1 Tablet Once A Day 2)  Carvedilol 6.25 Mg Tabs (Carvedilol) .Marland Kitchen.. 1 Tablet Two Times A Day 3)  Nitrostat 0.4 Mg Subl (Nitroglycerin) .... As Needed  Allergies (verified): No Known Drug Allergies  Past History:  Past Medical History: CEREBROVASCULAR ACCIDENT (ICD-434.91) NEPHROLITHIASIS (ICD-592.0) HYPERTENSION, BENIGN ESSENTIAL, LABILE (ICD-401.1) CAD (ICD-414.00) PEPTIC ULCER DISEASE, HX OF (ICD-V12.71) GASTROINTESTINAL HEMORRHAGE (ICD-578.9) OSTEOARTHRITIS, GENERALIZED (ICD-715.00)  Past Surgical History: Hysterectomy - for cervical cancer '72 Cholecystectomy-rupture(?) Cololectomy ascending colon '86 - found at time of GB surgery Melanoma excision distal right LE  '89 Scalp cancer - operated at [facility]  Family History: father - deceased @ 77:  mother- deceased @ 66: DM, HTN,  died of surgical complications Neg - colon cancer, CAD/MI son - h/o breast cancer, fatal Sister, Mother, sister - DM Postive for arthriits, HTN CVA, heart disease  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 Smoking Status:  current Packs/Day:  1.0 Caffeine use/day:  6 cups per day Dental Care w/in 6 mos.:  no Sun Exposure-Excessive:  no Seat Belt Use:  yes Fall Risk:  pt is a fall risk Blood Transfusions:  yes, after 2001  Review of Systems       The patient complains of dyspnea on exertion.  The patient denies anorexia, fever, weight loss, weight gain, decreased hearing, chest pain, peripheral edema, prolonged cough, headaches, abdominal pain, severe indigestion/heartburn, muscle weakness, difficulty walking, depression, and enlarged lymph nodes.     Physical Exam  General:  obese white female in no distress but with dysarthria, slight droop to right face and mouth Head:  normocephalic and atraumatic.  large scar right scalp Eyes:  vision grossly intact, pupils equal, and pupils round.  C&S clear Neck:  supple.   Lungs:  normal respiratory effort and normal breath sounds.   Heart:  normal rate and regular rhythm.   Abdomen:  Obese, soft, non-tender, and normal bowel sounds.   Msk:  no joint tenderness, no joint swelling, and no joint deformities.   Pulses:  2+ radial Extremities:  2+ LE edema Neurologic:  alert & oriented X3.  Facial droop mild on right, slurred speech, thickened tongue. Cognition seems grossly nl. MAE with nl motor strength. Ataxiic gait. Skin:  turgor normal, color normal, and no rashes.  Old scar at distal right LE. Cervical Nodes:  no anterior cervical adenopathy and no posterior cervical adenopathy.   Psych:  awake and alert oriented but poor insight into her medical problems and condition. she is declining offers of Home Health.    Impression & Recommendations:  Problem # 1:  CEREBROVASCULAR ACCIDENT (ICD-434.91) Patient with recent CVA leaving her with dysarthria and ataxia. She declines any PT/OT. She does continue to smoke. She is taking aspirin and she is taking her BP meds.  Plan - continue present meds           encouraged to stop smoking  Problem # 2:  HYPERTENSION, BENIGN ESSENTIAL, LABILE (ICD-401.1)  Her updated medication list for this problem includes:    Amlodipine Besylate 5 Mg Tabs (Amlodipine besylate) .Marland Kitchen... 1 tablet once a day    Carvedilol 6.25 Mg Tabs (Carvedilol) .Marland Kitchen... 1 tablet two times a day  BP today: 152/90  Poor control on present medication  Plan - early ROV and will adjust medications if BP remains elevated.   Problem # 3:  GASTROINTESTINAL HEMORRHAGE (ICD-578.9) Has not had any bleeding since in hospital. She had a lower GI bleed.  Plan- at next visit will send to lab  for CBC  Problem # 4:  OSTEOARTHRITIS, GENERALIZED (ICD-715.00) She has minor pain but is doing well without any NSAIDs or other pain medication.  Complete Medication List: 1)  Amlodipine Besylate 5 Mg Tabs (Amlodipine besylate) .Marland Kitchen.. 1 tablet once a day 2)  Carvedilol 6.25 Mg Tabs (Carvedilol) .Marland Kitchen.. 1 tablet two times a day 3)  Nitrostat 0.4 Mg Subl (Nitroglycerin) .... As needed   Orders Added: 1)  New Patient Level IV [16109]   Immunization History:  Pneumovax Immunization History:    Pneumovax:  historical (07/16/2010)  Influenza Immunization History:    Influenza:  declined (07/24/2010)   Immunization History:  Pneumovax Immunization History:    Pneumovax:  Historical (07/16/2010)  Influenza  Immunization History:    Influenza:  declined (07/24/2010)

## 2010-08-07 ENCOUNTER — Telehealth: Payer: Self-pay | Admitting: Internal Medicine

## 2010-08-07 ENCOUNTER — Ambulatory Visit (INDEPENDENT_AMBULATORY_CARE_PROVIDER_SITE_OTHER): Payer: MEDICARE | Admitting: Internal Medicine

## 2010-08-07 ENCOUNTER — Encounter: Payer: Self-pay | Admitting: Internal Medicine

## 2010-08-07 ENCOUNTER — Other Ambulatory Visit: Payer: Self-pay | Admitting: Internal Medicine

## 2010-08-07 ENCOUNTER — Encounter (INDEPENDENT_AMBULATORY_CARE_PROVIDER_SITE_OTHER): Payer: Self-pay | Admitting: *Deleted

## 2010-08-07 ENCOUNTER — Other Ambulatory Visit: Payer: MEDICARE

## 2010-08-07 DIAGNOSIS — I10 Essential (primary) hypertension: Secondary | ICD-10-CM

## 2010-08-07 DIAGNOSIS — K922 Gastrointestinal hemorrhage, unspecified: Secondary | ICD-10-CM

## 2010-08-07 DIAGNOSIS — I6350 Cerebral infarction due to unspecified occlusion or stenosis of unspecified cerebral artery: Secondary | ICD-10-CM

## 2010-08-07 LAB — CBC WITH DIFFERENTIAL/PLATELET
Basophils Absolute: 0 10*3/uL (ref 0.0–0.1)
Basophils Relative: 0.3 % (ref 0.0–3.0)
Eosinophils Absolute: 0.4 10*3/uL (ref 0.0–0.7)
Eosinophils Relative: 5.2 % — ABNORMAL HIGH (ref 0.0–5.0)
HCT: 39.2 % (ref 36.0–46.0)
Hemoglobin: 13 g/dL (ref 12.0–15.0)
Lymphocytes Relative: 15.8 % (ref 12.0–46.0)
Lymphs Abs: 1.2 10*3/uL (ref 0.7–4.0)
MCHC: 33.2 g/dL (ref 30.0–36.0)
MCV: 84.3 fl (ref 78.0–100.0)
Monocytes Absolute: 0.5 10*3/uL (ref 0.1–1.0)
Monocytes Relative: 6.2 % (ref 3.0–12.0)
Neutro Abs: 5.6 10*3/uL (ref 1.4–7.7)
Neutrophils Relative %: 72.5 % (ref 43.0–77.0)
Platelets: 225 10*3/uL (ref 150.0–400.0)
RBC: 4.64 Mil/uL (ref 3.87–5.11)
RDW: 16.1 % — ABNORMAL HIGH (ref 11.5–14.6)
WBC: 7.8 10*3/uL (ref 4.5–10.5)

## 2010-08-07 LAB — BASIC METABOLIC PANEL
BUN: 40 mg/dL — ABNORMAL HIGH (ref 6–23)
CO2: 28 mEq/L (ref 19–32)
Calcium: 8.4 mg/dL (ref 8.4–10.5)
Chloride: 107 mEq/L (ref 96–112)
Creatinine, Ser: 2.2 mg/dL — ABNORMAL HIGH (ref 0.4–1.2)
GFR: 23.09 mL/min — ABNORMAL LOW (ref >60.00)
Glucose, Bld: 92 mg/dL (ref 70–99)
Potassium: 4.2 mEq/L (ref 3.5–5.1)
Sodium: 143 mEq/L (ref 135–145)

## 2010-08-12 ENCOUNTER — Encounter: Payer: Self-pay | Admitting: Internal Medicine

## 2010-08-14 NOTE — Progress Notes (Signed)
Summary: refill  Phone Note Call from Patient Call back at Home Phone [phone] P PH     Caller: Patient Reason for Call: Refill Medication Summary of Call: Amlodipine Besylate 5 Mg Tabs and Carvedilol 6.25 Mg Tabs, pleaes call into [facility] on [address] Initial call taken by: Migdalia Dk,  August 07, 2010 2:22 PM    Prescriptions: CARVEDILOL 6.25 MG TABS (CARVEDILOL) 1 tablet two times a day  #60 x 12   Entered by:   Ami Bullins CMA   Authorized by:   Jacques Navy MD   Signed by:   Bill Salinas CMA on 08/07/2010   Method used:   Electronically to        [facility] (retail)       [address]       [place], [place]  [address]       Ph: [phone]       Fax: [phone]   RxID:   [id number] AMLODIPINE BESYLATE 5 MG TABS (AMLODIPINE BESYLATE) 1 tablet once a day  #30 x 12   Entered by:   Ami Bullins CMA   Authorized by:   Jacques Navy MD   Signed by:   Bill Salinas CMA on 08/07/2010   Method used:   Electronically to        [facility] (retail)       [address]       [place], [place]  [address]       Ph: [phone]       Fax: [phone]   RxID:   [id number]

## 2010-08-14 NOTE — Discharge Summary (Signed)
NAMEKAMILYA, [name]            ACCOUNT NO.:  [id number]  MEDICAL RECORD NO.:  [id number]           PATIENT TYPE:  I  LOCATION:  3019                         FACILITY:  [facility]  PHYSICIAN:  Nestor Ramp, MD        DATE OF BIRTH:  1934/10/12  DATE OF ADMISSION:  07/15/2010 DATE OF DISCHARGE:  07/19/2010                              DISCHARGE SUMMARY   DISCHARGE DIAGNOSES: 1. Hemorrhagic stroke. 2. Lower gastrointestinal bleed, resolved. 3. Hypertension. 4. Gastroesophageal reflux disease. 5. Sinus bradycardia. 6. The patient reports history of surgery for neoplasm of the colon,     although there is no evidence on physical exam and there were no     records supporting her claim. 7. Coronary artery disease, status post stent. 8. Acute renal failure (questionable acute on chronic).  DISCHARGE MEDICATIONS:  New medications: 1. Amlodipine 5 mg p.o. daily. 2. Carvedilol 6.25 mg 1 tablet p.o. twice daily with meals.CONSULTS:  Neurology.  PERTINENT LABORATORY DATA AT DISCHARGE:  Hemoglobin 10.2, hematocrit 32.6, platelets 246, this was nonreactive, creatinine 2.34.  TSH 0.998, iron 35.  PERTINENT STUDIES AT DISCHARGE: 1. CT head on July 15, 2010, showed acute small punctate hemorrhage     in the left basal ganglia possibly hemorrhagic in origin. 2. Acute infarct with a nonhemorrhagic within the left frontal region. 3. Stable atrophy, small vessel ischemic, and old lacunar infarct. 4. Chronic left sphenoid sinusitis and right maxillary sinus disease. 5. Repeat CT head on July 16, 2010 compared to July 15, 2010,     showed no significant change small hemorrhage in the left basal     ganglia is unchanged.  Acute infarct in the left frontal temporal     lobe is unchanged. 6. Colonoscopy on July 18, 2010, negative for acute bleed. Biopsied     polyps and diverticula. 7. Echo on July 16, 2010, ejection fraction of 40-55% left     ventricular hypokinesis, regional wall  motion abnormalities have     been excluded. 8. Carotid Dopplers on July 16, 2010, was negative.  BRIEF HOSPITAL COURSE:  The patient is a 75 year old female with a known history of hypertension that has gone many years without treatment or followup who presents with focal neurological deficits including dysarthria and the lower extremity weakness found to have hemorrhagic stroke on CT, also has hypertension, acute renal failure, possible acute on chronic, and a lower GI bleed. 1. Hemorrhagic stroke.  The patient was admitted and stroke workup was     obtained.  She was found to have hemorrhagic stroke as mentioned     above.  Repeat CT scan was stable.  Neurology consult was obtained.     The patient's symptoms improved during her hospital course     including her dysarthria. By date of discharge, the dysarthria has     mostly resolved as well as lower extremity weakness.  The     patient was treated with antihypertensives to maintain systolic blood     pressures within the range of 160-180. 2. Hypertension.  The patient has a known history of hypertension     untreated.  Her blood pressures were well controlled on Coreg and     Norvasc until July 18, 2009, where she began to have systolic     blood pressures within 200s.  She required p.r.n. hydralazine     p.o. and then IV in the evening.  On day of discharge, her blood     pressures had returned to normal with the systolic range of 170.     The patient was discharged with amlodipine 5 and Coreg 6.25 b.i.d. 3. GI bleed.  The patient initially gave a history of GI, neoplasm,     status post surgery, although there is no surgical scar on her     abdomen and no evidence of surgery on her colonoscopy.  During her hospital course the patient     was transfused 2 units of PRBCs on July 17, 2010, for low     hemoglobin of 7.4.  Post transfusion of hemoglobin was 8, then     9.3, then 10, 11.2.  By day of discharge, the patient's      hemoglobin was 10.2.  There is no evidence of bleeding on her     colonoscopy, although she did complain of some small blood in her     stool.  The patient was maintained on Protonix during her hospital     course.  She was discharged on July 18, 2010.  She was monitored     24 hours after IV Protonix was discontinued and there was no repeat     bleeding noted, and her hemoglobin was stable.  DISCHARGE PHYSICAL EXAM:  By day of discharge, the patient in no acute distress.  Her blood pressures were systolic in the 160s-170s, hemoglobin was stable at 10.2.  On physical exam, her dysarthria have resolved, she has 5/5 strength in her upper and lower extremities.  DISCHARGE INSTRUCTIONS:  The patient was discharged with instructions to advance activity slowly, to follow up with her new primary MD for which she already had an appointment on July 24, 2010.  She will follow up with her son's doctor.  Called his son to verify appointment and he verified that she did have an appointment on July 24, 2010.  The patient is also to follow up with Dr. Pearlean Brownie at [facility] for a repeat CT scan in 6 weeks to monitor hemorrhage.  The patient provided phone number to schedule followup appointment, phone number is [phone].  FOLLOWUP ISSUES AND RECOMMENDATIONS:  Recommend monitoring patient's blood pressure and compliance with antihypertensive medications.  Recommend adding ACE in the next 2-3 weeks.  DISCHARGE CONDITION:  The patient was discharged to home in stable medical condition.    ______________________________ Dessa Phi, MD   ______________________________ Nestor Ramp, MD    JF/MEDQ  D:  07/22/2010  T:  07/23/2010  Job:  [id number]  Electronically Signed by Dessa Phi MD on 08/05/2010 10:03:25 AM Electronically Signed by Denny Levy MD on 08/14/2010 04:13:06 PM

## 2010-08-14 NOTE — Assessment & Plan Note (Signed)
Summary: 2 wk fu/lb   Vital Signs:  Patient profile:   76 year old female Height:      65 inches Weight:      213 pounds BMI:     35.57 O2 Sat:      93 % on Room air Temp:     97.4 degrees F oral Pulse rate:   63 / minute BP sitting:   138 / 70  (left arm) Cuff size:   large  Vitals Entered By: Bill Salinas CMA (August 07, 2010 1:19 PM)  O2 Flow:  Room air CC: pt here with c/o coughing/ ab   Primary Care Provider:  Illene Regulus  CC:  pt here with c/o coughing/ ab.  History of Present Illness: [name] presents for follow-up after being seen as a new patient Feb 7th. In the interval she has cut down on her smoking. She has continued to take her medications as instructed. She does have continue dysarthria and ataxia. Her blood pressure today is much better.   Current Medications (verified): 1)  Amlodipine Besylate 5 Mg Tabs (Amlodipine Besylate) .Marland Kitchen.. 1 Tablet Once A Day 2)  Carvedilol 6.25 Mg Tabs (Carvedilol) .Marland Kitchen.. 1 Tablet Two Times A Day 3)  Nitrostat 0.4 Mg Subl (Nitroglycerin) .... As Needed  Allergies (verified): No Known Drug Allergies PMH-FH-SH reviewed-no changes except otherwise noted  Review of Systems       The patient complains of difficulty walking.  The patient denies anorexia, fever, weight loss, weight gain, decreased hearing, chest pain, dyspnea on exertion, prolonged cough, headaches, abdominal pain, and muscle weakness.    Physical Exam  General:  obese white female in no distress who is dysarthric Head:  normocephalic and atraumatic.   Eyes:  C&S clear Neck:  supple and full ROM.   Lungs:  normal respiratory effort and normal breath sounds.   Heart:  normal rate, regular rhythm, and no murmur.   Msk:  no joint tenderness, no joint swelling, and no joint warmth.   Pulses:  2+ radial pulse Neurologic:  alert & oriented X3.  Dysarthric, gait ataxia. Skin:  turgor normal and color normal.   Psych:  Oriented X3, normally interactive, and good  eye contact.     Impression & Recommendations:  Problem # 1:  CEREBROVASCULAR ACCIDENT (ICD-434.91) stable with long-term deficits  Problem # 2:  HYPERTENSION, BENIGN ESSENTIAL, LABILE (ICD-401.1)  Her updated medication list for this problem includes:    Amlodipine Besylate 5 Mg Tabs (Amlodipine besylate) .Marland Kitchen... 1 tablet once a day    Carvedilol 6.25 Mg Tabs (Carvedilol) .Marland Kitchen... 1 tablet two times a day  Orders: TLB-BMP (Basic Metabolic Panel-BMET) (80048-METABOL)  BP today: 138/70 Prior BP: 152/90 (07/24/2010)  BP is acceptable today.   Plan - continue present meds           Bmet  Problem # 3:  GASTROINTESTINAL HEMORRHAGE (ICD-578.9) No report of any signs of continue or recurrent bleed.  Plan - to lab for CBC           she is to call GI for follow-up appointment  Orders: TLB-CBC Platelet - w/Differential (85025-CBCD)  Complete Medication List: 1)  Amlodipine Besylate 5 Mg Tabs (Amlodipine besylate) .Marland Kitchen.. 1 tablet once a day 2)  Carvedilol 6.25 Mg Tabs (Carvedilol) .Marland Kitchen.. 1 tablet two times a day 3)  Nitrostat 0.4 Mg Subl (Nitroglycerin) .... As needed   Orders Added: 1)  TLB-CBC Platelet - w/Differential [85025-CBCD] 2)  TLB-BMP (Basic Metabolic Panel-BMET) [80048-METABOL] 3)  Est. Patient Level II [04540]

## 2010-08-23 NOTE — Letter (Signed)
[facility] [address] [place], [place]  [address] Phone: [phone]      August 13, 2010   [facility] [address] [place], [place] [address]  RE:  LAB RESULTS  Dear  [name],  The following is an interpretation of your most recent lab tests.  Please take note of any instructions provided or changes to medications that have resulted from your lab work.  ELECTROLYTES:  Good - no changes needed  KIDNEY FUNCTION TESTS:  Good - no changes needed  DIABETIC STUDIES:  Good - no changes needed Blood Glucose: 92    CBC:  Good - no changes needed  Lab results reveal stable blood counts and chemistries including normal renal fucntion.   Sincerely Yours,    Jacques Navy MD  Patient: [name] Note: All result statuses are Final unless otherwise noted.  Tests: (1) CBC Platelet w/Diff (CBCD)   White Cell Count          7.8 K/uL                    4.5-10.5   Red Cell Count            4.64 Mil/uL                 3.87-5.11   Hemoglobin                13.0 g/dL                   65.7-84.6   Hematocrit                39.2 %                      36.0-46.0   MCV                       84.3 fl                     78.0-100.0   MCHC                      33.2 g/dL                   96.2-95.2   RDW                  [H]  16.1 %                      11.5-14.6   Platelet Count            225.0 K/uL                  150.0-400.0   Neutrophil %              72.5 %                      43.0-77.0   Lymphocyte %              15.8 %                      12.0-46.0   Monocyte %                6.2 %  3.0-12.0   Eosinophils%         [H]  5.2 %                       0.0-5.0   Basophils %               0.3 %                       0.0-3.0   Neutrophill Absolute      5.6 K/uL                    1.4-7.7   Lymphocyte Absolute       1.2 K/uL                    0.7-4.0   Monocyte Absolute         0.5 K/uL                    0.1-1.0  Eosinophils, Absolute                             0.4 K/uL                    0.0-0.7   Basophils Absolute        0.0 K/uL                    0.0-0.1  Tests: (2) BMP (METABOL)   Sodium                    143 mEq/L                   135-145   Potassium                 4.2 mEq/L                   3.5-5.1   Chloride                  107 mEq/L                   96-112   Carbon Dioxide            28 mEq/L                    19-32   Glucose                   92 mg/dL                    16-10   BUN                  [H]  40 mg/dL                    9-60   Creatinine           [H]  2.2 mg/dL                   4.5-4.0   Calcium                   8.4 mg/dL                   9.8-11.9   GFR                  [  L]  23.09 mL/min                >60.00

## 2010-10-30 NOTE — H&P (Signed)
NAMEJACLYNN, LAUMANN            ACCOUNT NO.:  [id number]   MEDICAL RECORD NO.:  [id number]          PATIENT TYPE:  INP   LOCATION:  6713                         FACILITY:  [facility]   PHYSICIAN:  Della Goo, M.D. DATE OF BIRTH:  [birth year]   DATE OF ADMISSION:  01/02/2007  DATE OF DISCHARGE:                              HISTORY & PHYSICAL   PRIMARY CARE PHYSICIAN:  This is an unassigned patient.   CHIEF COMPLAINT:  Slurred speech.   HISTORY OF PRESENT ILLNESS:  This is a 76 year old female who presented  to the emergency department secondary to complaints of onset of slurring  of speech and facial drooping that occurred in the afternoon at  approximately 3:00 p.m..  The patient reports having headache and  dizziness when the symptoms occurred.  She denies having any chest pain  or shortness of breath or syncope associated with the event.  The  symptoms did not resolve, and the patient presented to the emergency  department later in the evening.   PAST MEDICAL HISTORY:  1. Coronary artery disease, status post PTCA with stent placement.  2. Type 2 diabetes mellitus.  3. CVA in the past.  4. Rheumatoid arthritis.  5. Recent neoplastic scalp mass.   PAST SURGICAL HISTORY:  1. Cataract surgery 2006.  2. Cholecystectomy 1986.  3. Total abdominal hysterectomy.  4. Subtotaled gastrectomy secondary to cancer status post      chemotherapy.  5. Recent scalp neoplasm resection and craniectomy.   MEDICATIONS:  1. Atenolol 50 mg one p.o. daily.  2. Lotrel 10/20 one p.o. daily.  3. Aspirin 325 mg one p.o. daily.  4. Hydrocodone/APAP 5/500 one to two tablets p.o. q.6 h p.r.n. pain.   ALLERGIES:  No known drug allergies.   SOCIAL HISTORY:  Positive tobacco history, one pack per day since age  55.  No history of alcohol usage.  No illicit drug usage.   FAMILY HISTORY:  Positive for hypertension in both parents and her  siblings. Positive for diabetes mellitus in her mother and  three  sisters. Positive for coronary artery disease in her mother and her  sisters. Positive for cancer in one brother and her son.   REVIEW OF SYSTEMS:  Pertinent for mentioned above.   PHYSICAL EXAMINATION:  GENERAL:  Obese 76 year old female in no acute  distress.  VITAL SIGNS:  Temperature 98.4, blood pressure 172/79, heart rate 54,  respirations 18, O2 saturation 96% on room air.  HEENT:  Positive surgical changes, healing scalp wound around the  periphery of the calvarium. No signs of infection. Pupils equally round  and reactive to light.  Extraocular muscles are intact. Funduscopic  benign.  There is right-sided facial drooping.  No tongue deviation.  Oropharynx is clear.  NECK:  Supple, full range of motion.  No thyromegaly, adenopathy,  jugular venous distension.  CARDIOVASCULAR:  Regular rate and rhythm.  No murmurs, gallops or rubs.  LUNGS:  Clear to auscultation bilaterally.  ABDOMEN:  Positive bowel sounds, soft, nontender, nondistended.  EXTREMITIES:  Without cyanosis, clubbing or edema.  NEUROLOGIC:  The patient is alert.  Her speech is moderately  dysarthric.  There is right-sided facial drooping.  Otherwise, there are no other  cranial nerve defects. The patient does not have right-sided hemiparesis  or left-sided hemiparesis.  She is able to move all of her extremities  and has 5/5 strength throughout.   LABORATORY STUDIES:  White blood cell count 8.5, hemoglobin 11.6,  hematocrit 35.7, platelets 295, neutrophils 66%, MCV 86.6. Pro time  12.7, INR 0.9, PTT 26.  Sodium 140, potassium 4.8, chloride 109, CO2 26,  BUN 32, creatinine 1.98, glucose 120, albumin 3.4, AST 11. Cardiac  enzymes:  Myoglobin 165, CK-MB 2.6, troponin I less than 0.05.   CT scan of the head reveals generalized cerebral and cerebellar atrophy,  chronic small vessel white matter ischemic changes, right basal ganglia  thalamic lacunar infarcts but no acute intracranial abnormality.  Posterior  right scalp soft tissue swelling identified secondary to  resection of high right scalp mass.   ASSESSMENT:  A 76 year old female presenting with stroke symptoms and  being admitted with  1. Cerebrovascular accident versus transient ischemic attack.  2. Coronary artery disease.  3. Hypertension.  4. Arthritis.  5. Recent scalp surgery secondary to neoplasm   PLAN:  The patient will be admitted to telemetry area for cardiac  monitoring.  Cardiac enzymes will be cycled.  The patient will be placed  on neurologic checks.  A bedside swallowing evaluation will be ordered  along with a speech therapy evaluation.  The patient will be n.p.o.  until she is cleared by speech therapy.  The patient will be placed on  Lopressor IV for elevated blood pressure.  DVT and GI prophylaxes have  also been ordered.  An open MRI will attempt to be ordered secondary to  patient declining to have the regular MRI study performed secondary to  claustrophobia.  The patient is adamant at this time not to receive  medication to undergo a closed MRI.  This will be further discussed with  her with the primary rounding team.  A nicotine patch will be ordered,  and smoking cessation counseling will be offered.      Della Goo, M.D.  Electronically Signed     HJ/MEDQ  D:  01/03/2007  T:  01/04/2007  Job:  [id number]

## 2010-10-30 NOTE — Assessment & Plan Note (Signed)
[facility]   NAME:  [name], [name]            ACCOUNT NO.:  [id number]   MEDICAL RECORD NO.:  [id number]      DATE OF BIRTH:  May 05, 1935   PHYSICIAN:  Theresia Majors. Tanda Rockers, M.D. VISIT DATE:  10/22/2006                                   OFFICE VISIT   REASON FOR CONSULTATION:  [name] is a 76 year old lady who was  seen by [facility] and referred by Rica Koyanagi, PA, and Dr.  Truddie Hidden for evaluation of a purulent wound.   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    DDX=  Sebaceous cyst, neoplasia, remote trauma with osteo.Marland Kitchen   RECOMMENDATIONS:  We have proceeded with a plain x-ray of the skull and  a CAT scan to rule out lytic lesions, and we have requested a  consultation to neurosurgery with the aforementioned diagnosis.  The  wound was cultured in the wound clinic.  We will reevaluate the patient  in 1 week to review the results of her laboratory and to monitor her  progress and procuring the consultation with the neurosurgeon.   SUBJECTIVE:  [name] is a 76 year old lady who presented to the  emergency room at [facility] complaining of cardiorespiratory symptoms,  October 09, 2006.  During that exam, it was noted that she had a draining  sinus on her head.  She was referred to the [facility] and ultimately  to the wound center.   The patient is accompanied by her sister and gives the following wound  history:  The lesion has been present for the past 5 years.  She has not  been evaluated by a physician specifically for the wound.  On occasions  the mass is smaller after draining onto her pillow.  She denies fever,  trauma, seizures, or neurological difficulties.  She reaffirms on  several inquiries that this wound has never been evaluated by a  physician.   PAST MEDICAL HISTORY:  Remarkable for no known allergies.   CURRENT MEDICATIONS:  Include:  1. Acyclovir 800 mg every 4 hours for 10  days.  2. Amlodipine/benazepril 10 mcg daily.  3. Atenolol 50 mg b.i.d.  4. Zocor 40 mg q.h.s.  5. Nitroglycerin tabs sublingually p.r.n.   PAST SURGERIES:  Included cardiac stent placement in 2001, cataract  surgeries in 2006, cholecystectomy in 1986, a hysterectomy remotely.  She has also had a subtotal gastrectomy for cancer.  She underwent a  course of chemotherapy in 1986.   FAMILY HISTORY:  Positive for hypertension, heart attack, and stroke.  Positive for diabetes.   SOCIAL HISTORY:  She is divorced.  She has one live son who lives in the  local area.  She is retired from [employer].   REVIEW OF SYSTEMS:  She is markedly incapacitated by degenerative joint  disease.  She has been offered a knee replacement for control of her  symptoms, but she has declined.  The patient smokes 1/2 pack of  cigarettes a day and has been doing so for 50 years.  She has recently  had an episode of shingles.  She is also hypertensive.  She complains of  some loose stools on a weekly basis, that she has not been endoscoped.  She admits  to chest pain, for which she takes occasional nitroglycerin.  Her diet is good.  She reports a 10-pound weight loss since her  relocation from [place] to [place] over the past 8 months.  The  remainder of the review of systems is negative.   PHYSICAL EXAMINATION:  GENERAL:  She is alert, oriented female, somewhat  elusive with a flat affect.  VITAL SIGNS:  Her blood pressure is 144/80, respirations 18, pulse rate  62, temperature 98.  She is accompanied by her sister.  HEENT EXAM:  Remarkable for a fluctuant, purulent lesion on the  posterolateral occipital-parietal area, right head.  This area has mild  inflammation.  There is no associated cervical adenopathy.  The thyroid  is nonpalpable.  The wound was cultured, measured and photographed.  HEART:  The heart sounds are distant.  ABDOMEN:  Soft.  EXTREMITIES:  Warm, bilateral dorsalis pedis pulses are  palpable.  NEUROLOGICALLY:  The patient moves all fours, sensation is intact, and  there are no pathologic reflexes.   DISCUSSION:  [name] gives a history of this lesion having been  present for 5 years and has been without ongoing medical supervision.  On physical exam, the wound has obvious sinuses with a milky drainage,  which was cultured.  We will not proceed with any incision and drainage,  as we suspect that the history of this wound suggests a potential of  malignancy or complicated pathology.  We will proceed with the cultures  and the radiologic studies as outlined above and make the referral to  the neurosurgeon.  We will see the patient in 1 week and to make sure  that those referrals have been executed.      Harold A. Tanda Rockers, M.D.  Electronically Signed     HAN/MEDQ  D:  10/22/2006  T:  10/22/2006  Job:  [id number]

## 2010-10-30 NOTE — Assessment & Plan Note (Signed)
[facility]                            CARDIOLOGY OFFICE NOTE   NAME:[name], [name]                   MRN:          [id number]  DATE:01/21/2007                            DOB:          16-Jul-1934    This is a patient of Pricilla Riffle, M.D., [facility].  This is a 76 year old  white female patient who was recently hospitalized with an acute CVA of  the left central semiovale with dysarthria and dysphagia.  The patient  was placed on Plavix and aspirin, and apparently had trouble with her  hypertension, and all of her antihypertensives were changed.  A 2D echo  while she was in the hospital revealed an ejection fraction of 60% with  no wall motion abnormalities.  Wall thickness was mildly to moderately  increased.  Aortic valve thickness was mildly increased.  Mild MR and  left atrium was mildly dilated.   The patient is here today and is quite upset because we did not see her  in the hospital, and wants to know why all her medications were changed.  The patient continues to smoke a half a pack of cigarettes a day.  She  denies any cardiac complaints, no chest pain, palpitations, dizziness,  or pre-syncope.  She does have a history of coronary artery disease, I  believe, with a stent in the past, although that occurred when she was  living in [place], and we have no records of this.  She has only seen  Dr. Tenny Craw once in May to become established with our practice.  She has  also had long-term hypertension.   CURRENT MEDICATIONS:  1. Aspirin 325 mg daily.  2. Benazepril 20 mg daily.  3. Plavix 75 mg daily.  4. Amlodipine 10 mg daily.  5. Clonidine 0.2 mg t.i.d.  6. Food thickener.   PHYSICAL EXAM:  This is an anxious 76 year old white female in no acute  distress.  Blood pressure 140/71, pulse 57, weight 219.  NECK:  Without JVD, HJR, bruit, or thyroid enlargement.  LUNGS:  Clear anterior, posterior, and lateral.  HEART:  Regular rate and rhythm at  57 beats per minute.  Normal S1, S2.  No murmur, rub, bruit, thrill, or heave noted.  ABDOMEN:  Obese.  Normoactive bowel sounds are heard throughout.  EXTREMITIES:  Edema on the right with brawny changes secondary to prior  break.  Decreased distal pulses, but present.   IMPRESSION:  1. Recent acute cerebrovascular accident of the left centrum semiovale      with dysarthria and dysphagia.  2. Hypertension, better controlled.  3. Gastroesophageal reflux disease.  4. Sinus bradycardia.  5. Normal left ventricular function.  6. Recent scalp surgery for neoplasm being followed at [facility].  7. Smoker.   PLAN:  At this time, the patient is stable from a cardiac standpoint,  and her blood pressure is controlled on her present medications.  I will  make no changes, and she will see Dr. Tenny Craw back in the next several  months.      Jacolyn Reedy, PA-C  Electronically Signed  Thomas C. Daleen Squibb, MD, [facility]  Electronically Signed   ML/MedQ  DD: 01/21/2007  DT: 01/21/2007  Job #: [id number]

## 2010-10-30 NOTE — Discharge Summary (Signed)
[name], [name]            ACCOUNT NO.:  [id number]   MEDICAL RECORD NO.:  [id number]          PATIENT TYPE:  INP   LOCATION:  6713                         FACILITY:  [facility]   PHYSICIAN:  Isidor Holts, M.D.  DATE OF BIRTH:  [birth year]   DATE OF ADMISSION:  01/02/2007  DATE OF DISCHARGE:                               DISCHARGE SUMMARY   DISCHARGE DIAGNOSES:  1. Acute cerebrovascular accident left centrum semiovale with      dysarthria and dysphagia.  2. Hypertension.  3. Gastroesophageal reflux disease.  4. Sinus bradycardia.  5. Smoking history.  6. Status post recent scalp surgery for neoplasm.   DISCHARGE MEDICATIONS:  1. Enteric-coated Aspirin 325 mg p.o. daily.  2. Plavix 75 mg p.o. daily.  3. Norvasc 10 mg p.o. daily.  4. Benazepril 20 mg p.o. daily.  5. Clonidine 0.2 mg p.o. t.i.d.  6. D3 diet /Honey thick liquids and chin tuck.   PROCEDURES:  1. Head CT scan dated January 02, 2007.  This showed no evidence of acute      intracranial abnormality.  There was atrophy, chronic small-vessel      white matter disease, remote lacunar infarcts, interval resection      of right scalp mass.  2. Brain MRI dated January 04, 2007.  This showed acute stroke in the      left centrum semiovale.  Also, superimposed changes of advanced      chronic small-vessel disease, no evidence of metastatic disease to      the brain.  3. Brain MRA dated January 04, 2007.  This showed findings suspicious of      acute occlusion of left MCA M2 anterior sylvian division branch      congruent with conventional MRI findings of acute stroke in the      centrum semiovale above.  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  4. Modified barium swallow examination dated January 03, 2007.  This      showed moderate to severe pharyngeal dysphagia,  pharyngeal      penetration with thin nectar and honey.  Chin-tuck technique      reduced penetration.  She was recommended dysphagia 3 diet with      honey-thick liquids.  5. A 2-D echocardiogram dated January 05, 2007.  This showed overall      normal left ventricular systolic function, EF 60%, no left      ventricular regional wall motion abnormalities.  LV wall thickness      was mildly to moderately increased.  Left ventricular diastolic      function parameters were normal.  There was mild mitral valvular      regurgitation.  The left atrium was mildly dilated.   CONSULTATIONS:  None.   ADMISSION HISTORY:  As in H&P notes of January 03, 2007, dictated by Dr.  Della Goo.  However, in brief, this is a  76 year old female, with  known history of coronary artery disease, status post PTCA and stent,  cerebrovascular disease, rheumatoid arthritis, recent neoplastic scalp  mass status post resection, previous history of cataract surgery,  cholecystectomy, total abdominal hysterectomy, and subtotal gastrectomy  secondary to cancer, who presents with slurred speech associated with  facial droop.  On initial evaluation in the emergency department, she  was found to be dysarthric with right-sided facial droop.  limb strength  appeared normal.  She was admitted for further evaluation,  investigation, and management on suspicion of acute CVA.   CLINICAL COURSE:  #1 - CEREBROVASCULAR ACCIDENT.  For details of  presentation, refer to admission history above.  In view of the  patient's focal neurologic deficits on clinical examination, she  underwent brain imaging studies including head CT scan, brain MRI/MRA.  For details of findings, refer to procedure list above.  These confirmed  an acute left centrum semiovale CVA.  The patient was managed with  antiplatelet medication.  In view of her previous history of  cerebrovascular disease and CT scan findings suggestive of remote  infarcts she was  placed on a combination of Aspirin and Plavix.  Risk  factor modification was also pursued.  Serial homocysteine levels were  normal at 11.7.  Lipid profile was excellent with a total cholesterol of  125, triglycerides 126, HDL 39, LDL 61.  She underwent physical  therapy/occupational therapy and also speech pathology evaluation which  confirmed moderate to severe pharyngeal dysphagia.  She has been  recommended D3 diet and is tolerating this well.  The patient has been  evaluated by PT/OT.  It is felt that her functionality is too good for  inpatient rehabilitation and she has been recommended home health PT/OT.   #2 - SMOKING HISTORY.  The patient has been counseled appropriately and  was managed during the course of her hospitalization with NicoDerm CQ  patch.   #3 - HYPERTENSION.  The patient at the time of presentation had an  elevated blood pressure 172/79 mmHg.  This has been addressed with a  combination of calcium channel blocker, ACE inhibitor, and Clonidine.  Of course, caution has been maintained not to overly aggressively lower  blood pressure in view of her recent CVA for fear of inducing cerebral  hypoperfusion.  Clearly, this will need to be monitored following  discharge by the patient's primary MD, and antihypertensive medications  titrated as appropriate.   #4 - SINUS BRADYCARDIA.  During the course of her hospitalization, the  patient maintained a sinus bradycardia on the average of about 44-50 per  minute.  She has been asymptomatic with this rate, is in sinus rhythm.  Thyroid function studies show her to be euthyroid with a TSH of 0.662.  Beta blockers have therefore been avoided during this hospitalization.   #5 - HISTORY OF CORONARY ARTERY DISEASE.  The patient has remained  asymptomatic from this viewpoint.   #6 - SCALP WOUND.  The patient has a healing scalp wound secondary to  recent removal of scalp neoplasm.  No evidence of infection is noted.  She has  continued to receive local care during the course of her  hospitalization.   DISPOSITION:  It is anticipated that the patient will be clinically  stable for discharge in the next couple of days, i.e. January 08, 2007, or  January 09, 2007.  She has been evaluated by physical therapy/occupational  therapy, level of functionality is deemed too high for inpatient  rehabilitation and  home health PT/OT has been recommended.  The patient  is due to undergo repeat modified barium swallow examination on January 08, 2007.  Further recommendations as regards her diet will be made based on  results.   ACTIVITY:  Per PT/OT, otherwise as tolerated.   FOLLOWUP INSTRUCTIONS:  The patient is recommended to follow up  routinely with her surgeon at [facility].  She certainly needs a primary MD in this locality for  routine followup and preventative care and should certainly be seen on  followup within 1-2 weeks of discharge. Follow up routinely with Dr  Tenny Craw, cardiologist.   Note, the patient appears to have a questionable history of diabetes  mellitus.  Throughout her hospitalization, however, she has remained  euglycemic and has not required any insulin coverage or oral  hypoglycemic medication.  This diagnosis is thought very much in doubt.   SPECIAL INSTRUCTIONS: Home health aide, PT/OT/RN and needed equipment  has been arranged. also Child psychotherapist, and out patient speech therapist  follow-up, with modified barium swallow re-examination.      Isidor Holts, M.D.  Electronically Signed     CO/MEDQ  D:  01/07/2007  T:  01/07/2007  Job:  [id number]

## 2010-10-30 NOTE — Assessment & Plan Note (Signed)
[name]                            CARDIOLOGY OFFICE NOTE   NAME:[name], [name]                   MRN:          [id number]  DATE:10/20/2006                            DOB:          1935-06-11    PATIENT IDENTIFICATION:  [name] is a woman who presents for the  first time for evaluation. Note she was recently seen in the ER for  chest pain. She is 76 years old with a history of CAD.   HISTORY OF PRESENT ILLNESS:  We have no old medical records. The patient  moved here from [place]. She had a reported stent to a vessel though  again I do not have the records.   She went to the emergency room on April 28 with chest pain that she  describes as right parasternal chest pain that was pleuritic. She had  blood work done and was sent home. Note she had also complaints of  shingles and was started on treatment with this.   The patient is not the best historian. It is not clear when she was last  seen in the clinic in [place] but she was upset with the care there.   She still has discomfort on and off that occurs one time per week for  the past 2 years, it can be right or left parasternal, usually with  activity. It goes away on its own, has not taken a nitroglycerin.   ALLERGIES:  Reported IV DYE reaction.   PAST MEDICAL HISTORY:  1. CAD.  2. Hypertension.  3. Shingles recently diagnosed now on antiviral therapy.  4. History of osteoarthritis.  5. Reflux.   CURRENT MEDICATIONS:  The list I have includes:  1. Doxicycline 100 b.i.d.  2. Acyclovir as directed.  3. Lotrel 10/20 daily.  4. Darvocet daily.  5. Atenolol 50 b.i.d.  6. Aspirin 325 daily.   FAMILY HISTORY:  Father died at age 59 of liver and kidney problems.  Mother died at age 24 of diabetic complications. One brother died of  cancer at age 8. One sone died of cancer at age 60, one son alive.   SOCIAL HISTORY:  The patient continues to smoke about a half pack per  day.  She does not drink.   REVIEW OF SYSTEMS:  All systems are reviewed. Negative to the above  problem except as noted previously.   PHYSICAL EXAMINATION:  GENERAL:  The patient is currently in no  distress.  VITAL SIGNS:  Blood pressure is 213/77, pulse is 43 and irregular,  weight 220.  LUNGS:  Clear.  NECK:  JVP is normal, no bruits.  CARDIAC:  Regular rate and rhythm, S1, S2, no S3.  ABDOMEN:  Benign.  CHEST:  Vesicles that are at different stages under the right breast  going to the back. Erythema surrounding.  EXTREMITIES:  Good distal pulses, no lower extremity edema.   A 12-lead EKG normal sinus bradycardia, 43 beats per minute. LVH. ST  changes consistent with repolarization abnormality, cannot rule out  ischemia. No old EKGs.   VQ scan done in the hospital for an elevated D-dimer  was low probability  for a pulmonary embolus.   Other blood work included creatinine of 1.7 to 1.9, hemoglobin of 13, D-  dimer 1.1, troponin negative x3. LDL of 97, HDL of 34, triglycerides  161.   IMPRESSION:  [name] is a difficult historian. She was recently  seen in emergency with right parasternal chest pain in the site of her  shingles. It was atypical, was pleuritic in nature and may indeed be  related to the shingles themselves. I am not convinced it is cardiac.   She has a longstanding history of intermittent pain in her chest. Again  I am not concerned there are signs of active ischemia. I do not have the  records.   On examination today, she has elevated blood pressure. I would go ahead  and recommend that she begin Maxzide 37.5/25 daily and will followup her  blood pressure in 4-6 weeks. Will check a BMET in one week.   In regards to her coronary artery disease, I will also add Zocor to her  regimen.   I will be in touch with her. I would in fact keep the atenolol where it  is now and followup her heart rate at the next visit.     Sarah Riffle, MD, [facility]   Electronically Signed    PVR/MedQ  DD: 10/20/2006  DT: 10/21/2006  Job #: [id number]

## 2010-11-02 NOTE — Assessment & Plan Note (Signed)
[facility]   NAME:  BETHSAIDA, SIEGENTHALER            ACCOUNT NO.:  [id number]   MEDICAL RECORD NO.:  [id number]      DATE OF BIRTH:  September 19, 1934   PHYSICIAN:  Theresia Majors. Tanda Rockers, M.D.      VISIT DATE:                                   OFFICE VISIT   Note of conversation with Dr. Rennis Harding, Neurosurgeon, [facility].   Dr. Rennis Harding has reviewed the CAT scan and skull x-rays of Ms. Bamberg and  has recommended that we proceed with a plastic surgery referral to the  [facility] Plastic Surgical service directed to Dr. Izora Gala.  Dr. Rennis Harding has indicated that if there is any extension of the  scalp mass into the calvarium and if there is any need for craniectomy  or neurosurgical input that the neurosurgery service will be happy to  accommodate Ms. Lorincz in concert with the Engineer, petroleum.   PLAN:  We will redirect Ms. Mallie Mussel Auker's consultation as per  above.      Harold A. Tanda Rockers, M.D.  Electronically Signed     HAN/MEDQ  D:  11/03/2006  T:  11/03/2006  Job:  [id number]

## 2010-11-02 NOTE — Assessment & Plan Note (Signed)
[facility]   NAME:  Sarah, Jarvis            ACCOUNT NO.:  [id number]   MEDICAL RECORD NO.:  [id number]      DATE OF BIRTH:  [birth year]   PHYSICIAN:  Theresia Majors. Tanda Rockers, M.D.      VISIT DATE:                                   OFFICE VISIT   SUBJECTIVE:  [name] is a 76 year old lady who we have evaluated  for a mass on her posterior occipital calvarium.  In the interim, she  has had a CT scan without contrast, cultures of the drainage and she  returns for followup.  She denies headache, nausea, vomiting, or  instability.   OBJECTIVE:  Blood pressure is 163/75, respirations are 18, pulse rate is  45, temperature is 98.  The HEENT exam is clear.  The wound is  essentially unchanged.  There is no increased hyperemia.  There is a  milky exudate.  There is moderate tenderness, but there is no intense  cellulitis.  Neurologically, the patient is grossly intact.  Her culture  grew a Citrobacter freundii which is sensitive to sulfur.  The CT scan  of the head without contrast confirmed the 5 cm mass in the subcutaneous  tissue in the posterior parietal region and it appears radiographically  consistent with a neoplastic mass.  There are associated occipital nodes  on the right and the left.   ASSESSMENT:  Suspicious lesion of the calvarium.   PLAN:  We have instructed the patient that she would need a neurosurgery  consultation either locally or at [facility] [place].  Toward these ends,  we will assist her in procuring that consultation.  In the meantime, we  will return her to the care of her primary care physician, Sarah Neighbours,  PA, at [facility].  The patient and her sister have been given an  opportunity to ask questions.  They mutually indicate that they  understand the recommendation and intend to proceed with the  consultation.  She is discharged.      Harold A. Tanda Rockers, M.D.  Electronically Signed     HAN/MEDQ  D:  10/29/2006  T:  10/29/2006   Job:  [id number]   cc:   Sarah Neighbours, PA

## 2011-04-01 LAB — IRON AND TIBC
Iron: 23 — ABNORMAL LOW
UIBC: 216

## 2011-04-01 LAB — COMPREHENSIVE METABOLIC PANEL
ALT: 13
AST: 11
CO2: 26
Calcium: 8.5
Chloride: 109
Creatinine, Ser: 1.98 — ABNORMAL HIGH
GFR calc non Af Amer: 25 — ABNORMAL LOW
Glucose, Bld: 120 — ABNORMAL HIGH
Total Bilirubin: 0.4

## 2011-04-01 LAB — BASIC METABOLIC PANEL
BUN: 14
CO2: 26
CO2: 27
Calcium: 8.1 — ABNORMAL LOW
Calcium: 8.5
Chloride: 107
Chloride: 113 — ABNORMAL HIGH
Creatinine, Ser: 1.34 — ABNORMAL HIGH
Creatinine, Ser: 1.65 — ABNORMAL HIGH
GFR calc Af Amer: 37 — ABNORMAL LOW
GFR calc Af Amer: 39 — ABNORMAL LOW
GFR calc Af Amer: 48 — ABNORMAL LOW
GFR calc non Af Amer: 31 — ABNORMAL LOW
GFR calc non Af Amer: 33 — ABNORMAL LOW
GFR calc non Af Amer: 40 — ABNORMAL LOW
Glucose, Bld: 114 — ABNORMAL HIGH
Glucose, Bld: 93
Potassium: 4.1
Potassium: 4.3
Potassium: 4.7
Sodium: 144
Sodium: 144

## 2011-04-01 LAB — CBC
HCT: 29.3 — ABNORMAL LOW
HCT: 29.7 — ABNORMAL LOW
Hemoglobin: 11.6 — ABNORMAL LOW
Hemoglobin: 9.8 — ABNORMAL LOW
MCHC: 32.4
MCHC: 32.8
MCV: 85.4
MCV: 86.6
Platelets: 198
RBC: 3.47 — ABNORMAL LOW
RBC: 4.12
RDW: 17.5 — ABNORMAL HIGH
WBC: 5.7
WBC: 6
WBC: 8.5

## 2011-04-01 LAB — VITAMIN B12: Vitamin B-12: 448 (ref 211–911)

## 2011-04-01 LAB — DIFFERENTIAL
Basophils Absolute: 0.1
Eosinophils Absolute: 0.3
Eosinophils Relative: 4
Lymphocytes Relative: 22
Lymphs Abs: 1.9
Neutrophils Relative %: 66

## 2011-04-01 LAB — FERRITIN: Ferritin: 68 (ref 10–291)

## 2011-04-01 LAB — PROTIME-INR
INR: 0.9
Prothrombin Time: 12.7

## 2011-04-01 LAB — CK TOTAL AND CKMB (NOT AT ARMC): CK, MB: 1.2

## 2011-04-01 LAB — RETICULOCYTES
RBC.: 3.56 — ABNORMAL LOW
Retic Count, Absolute: 46.3
Retic Ct Pct: 1.3

## 2011-04-01 LAB — CARDIAC PANEL(CRET KIN+CKTOT+MB+TROPI)
CK, MB: 1.2
Relative Index: INVALID
Relative Index: INVALID
Total CK: 29
Troponin I: 0.01
Troponin I: 0.03

## 2011-04-01 LAB — LIPID PANEL
Total CHOL/HDL Ratio: 3.2
VLDL: 25

## 2011-04-01 LAB — POCT CARDIAC MARKERS
Operator id: 189501
Troponin i, poc: <0.05

## 2011-04-01 LAB — TROPONIN I: Troponin I: 0.01

## 2011-04-01 LAB — APTT: aPTT: 26

## 2011-04-01 LAB — TSH: TSH: 0.684

## 2011-08-15 ENCOUNTER — Other Ambulatory Visit: Payer: Self-pay | Admitting: Internal Medicine

## 2012-08-25 ENCOUNTER — Encounter (HOSPITAL_COMMUNITY): Payer: Self-pay | Admitting: Emergency Medicine

## 2012-08-25 ENCOUNTER — Inpatient Hospital Stay (HOSPITAL_COMMUNITY): Payer: Medicare Other

## 2012-08-25 ENCOUNTER — Inpatient Hospital Stay (HOSPITAL_COMMUNITY)
Admission: EM | Admit: 2012-08-25 | Discharge: 2012-09-02 | DRG: 291 | Disposition: A | Discharge status: Home Health | Payer: Medicare Other | Attending: Internal Medicine | Admitting: Internal Medicine

## 2012-08-25 ENCOUNTER — Emergency Department (HOSPITAL_COMMUNITY): Payer: Medicare Other

## 2012-08-25 DIAGNOSIS — D649 Anemia, unspecified: Secondary | ICD-10-CM | POA: Diagnosis present

## 2012-08-25 DIAGNOSIS — J441 Chronic obstructive pulmonary disease with (acute) exacerbation: Secondary | ICD-10-CM

## 2012-08-25 DIAGNOSIS — F329 Major depressive disorder, single episode, unspecified: Secondary | ICD-10-CM | POA: Diagnosis present

## 2012-08-25 DIAGNOSIS — I5043 Acute on chronic combined systolic (congestive) and diastolic (congestive) heart failure: Secondary | ICD-10-CM | POA: Diagnosis present

## 2012-08-25 DIAGNOSIS — E876 Hypokalemia: Secondary | ICD-10-CM | POA: Diagnosis not present

## 2012-08-25 DIAGNOSIS — I10 Essential (primary) hypertension: Secondary | ICD-10-CM

## 2012-08-25 DIAGNOSIS — Z9861 Coronary angioplasty status: Secondary | ICD-10-CM

## 2012-08-25 DIAGNOSIS — F3289 Other specified depressive episodes: Secondary | ICD-10-CM | POA: Diagnosis present

## 2012-08-25 DIAGNOSIS — I130 Hypertensive heart and chronic kidney disease with heart failure and stage 1 through stage 4 chronic kidney disease, or unspecified chronic kidney disease: Principal | ICD-10-CM | POA: Diagnosis present

## 2012-08-25 DIAGNOSIS — I498 Other specified cardiac arrhythmias: Secondary | ICD-10-CM | POA: Diagnosis present

## 2012-08-25 DIAGNOSIS — N183 Chronic kidney disease, stage 3 unspecified: Secondary | ICD-10-CM | POA: Diagnosis present

## 2012-08-25 DIAGNOSIS — I69922 Dysarthria following unspecified cerebrovascular disease: Secondary | ICD-10-CM

## 2012-08-25 DIAGNOSIS — L02419 Cutaneous abscess of limb, unspecified: Secondary | ICD-10-CM | POA: Diagnosis not present

## 2012-08-25 DIAGNOSIS — J449 Chronic obstructive pulmonary disease, unspecified: Secondary | ICD-10-CM | POA: Diagnosis present

## 2012-08-25 DIAGNOSIS — K649 Unspecified hemorrhoids: Secondary | ICD-10-CM | POA: Diagnosis present

## 2012-08-25 DIAGNOSIS — Z79899 Other long term (current) drug therapy: Secondary | ICD-10-CM

## 2012-08-25 DIAGNOSIS — J4489 Other specified chronic obstructive pulmonary disease: Secondary | ICD-10-CM | POA: Diagnosis present

## 2012-08-25 DIAGNOSIS — E669 Obesity, unspecified: Secondary | ICD-10-CM | POA: Diagnosis present

## 2012-08-25 DIAGNOSIS — K219 Gastro-esophageal reflux disease without esophagitis: Secondary | ICD-10-CM | POA: Diagnosis present

## 2012-08-25 DIAGNOSIS — I2510 Atherosclerotic heart disease of native coronary artery without angina pectoris: Secondary | ICD-10-CM | POA: Diagnosis present

## 2012-08-25 DIAGNOSIS — I252 Old myocardial infarction: Secondary | ICD-10-CM

## 2012-08-25 DIAGNOSIS — M129 Arthropathy, unspecified: Secondary | ICD-10-CM | POA: Diagnosis present

## 2012-08-25 DIAGNOSIS — F17200 Nicotine dependence, unspecified, uncomplicated: Secondary | ICD-10-CM | POA: Diagnosis present

## 2012-08-25 DIAGNOSIS — Z9119 Patient's noncompliance with other medical treatment and regimen: Secondary | ICD-10-CM

## 2012-08-25 DIAGNOSIS — I509 Heart failure, unspecified: Secondary | ICD-10-CM | POA: Diagnosis present

## 2012-08-25 DIAGNOSIS — B851 Pediculosis due to Pediculus humanus corporis: Secondary | ICD-10-CM

## 2012-08-25 DIAGNOSIS — Z91199 Patient's noncompliance with other medical treatment and regimen due to unspecified reason: Secondary | ICD-10-CM

## 2012-08-25 DIAGNOSIS — B850 Pediculosis due to Pediculus humanus capitis: Secondary | ICD-10-CM | POA: Diagnosis not present

## 2012-08-25 DIAGNOSIS — N289 Disorder of kidney and ureter, unspecified: Secondary | ICD-10-CM | POA: Diagnosis present

## 2012-08-25 HISTORY — DX: Headache: R51

## 2012-08-25 HISTORY — DX: Acute myocardial infarction, unspecified: I21.9

## 2012-08-25 HISTORY — DX: Depression, unspecified: F32.A

## 2012-08-25 HISTORY — DX: Major depressive disorder, single episode, unspecified: F32.9

## 2012-08-25 HISTORY — DX: Essential (primary) hypertension: I10

## 2012-08-25 HISTORY — DX: Anemia, unspecified: D64.9

## 2012-08-25 HISTORY — DX: Malignant (primary) neoplasm, unspecified: C80.1

## 2012-08-25 HISTORY — DX: Cerebral infarction, unspecified: I63.9

## 2012-08-25 HISTORY — DX: Gastro-esophageal reflux disease without esophagitis: K21.9

## 2012-08-25 HISTORY — DX: Unspecified osteoarthritis, unspecified site: M19.90

## 2012-08-25 LAB — CBC WITH DIFFERENTIAL/PLATELET
Basophils Relative: 0 % (ref 0–1)
Eosinophils Absolute: 0.5 10*3/uL (ref 0.0–0.7)
HCT: 36.5 % (ref 36.0–46.0)
Hemoglobin: 11.4 g/dL — ABNORMAL LOW (ref 12.0–15.0)
Lymphs Abs: 1.2 10*3/uL (ref 0.7–4.0)
MCH: 26 pg (ref 26.0–34.0)
MCHC: 31.2 g/dL (ref 30.0–36.0)
MCV: 83.3 fL (ref 78.0–100.0)
Monocytes Absolute: 0.6 10*3/uL (ref 0.1–1.0)
Monocytes Relative: 6 % (ref 3–12)
RBC: 4.38 MIL/uL (ref 3.87–5.11)

## 2012-08-25 LAB — PRO B NATRIURETIC PEPTIDE: Pro B Natriuretic peptide (BNP): 11398 pg/mL — ABNORMAL HIGH (ref 0–450)

## 2012-08-25 LAB — CREATININE, SERUM
Creatinine, Ser: 2.7 mg/dL — ABNORMAL HIGH (ref 0.50–1.10)
GFR calc non Af Amer: 16 mL/min — ABNORMAL LOW (ref >90)

## 2012-08-25 LAB — COMPREHENSIVE METABOLIC PANEL
Albumin: 3.1 g/dL — ABNORMAL LOW (ref 3.5–5.2)
Alkaline Phosphatase: 72 U/L (ref 39–117)
BUN: 52 mg/dL — ABNORMAL HIGH (ref 6–23)
Creatinine, Ser: 2.72 mg/dL — ABNORMAL HIGH (ref 0.50–1.10)
GFR calc Af Amer: 19 mL/min — ABNORMAL LOW (ref >90)
Glucose, Bld: 146 mg/dL — ABNORMAL HIGH (ref 70–99)
Total Bilirubin: 0.2 mg/dL — ABNORMAL LOW (ref 0.3–1.2)
Total Protein: 6.8 g/dL (ref 6.0–8.3)

## 2012-08-25 LAB — GLUCOSE, CAPILLARY: Glucose-Capillary: 119 mg/dL — ABNORMAL HIGH (ref 70–99)

## 2012-08-25 LAB — CBC
MCHC: 31 g/dL (ref 30.0–36.0)
RDW: 17 % — ABNORMAL HIGH (ref 11.5–15.5)

## 2012-08-25 LAB — TSH: TSH: 0.813 u[IU]/mL (ref 0.350–4.500)

## 2012-08-25 LAB — TROPONIN I: Troponin I: <0.3 ng/mL (ref <0.30)

## 2012-08-25 MED ORDER — CARVEDILOL 6.25 MG PO TABS
6.2500 mg | ORAL_TABLET | Freq: Two times a day (BID) | ORAL | Status: DC
  Administered 2012-08-25 – 2012-09-02 (×16): 6.25 mg via ORAL
  Filled 2012-08-25 (×18): qty 1

## 2012-08-25 MED ORDER — FUROSEMIDE 10 MG/ML IJ SOLN
40.0000 mg | Freq: Two times a day (BID) | INTRAMUSCULAR | Status: DC
  Administered 2012-08-25 – 2012-08-28 (×6): 40 mg via INTRAVENOUS
  Filled 2012-08-25 (×8): qty 4

## 2012-08-25 MED ORDER — AMLODIPINE BESYLATE 5 MG PO TABS
5.0000 mg | ORAL_TABLET | Freq: Every day | ORAL | Status: DC
  Administered 2012-08-25 – 2012-08-27 (×3): 5 mg via ORAL
  Filled 2012-08-25 (×4): qty 1

## 2012-08-25 MED ORDER — ONDANSETRON HCL 4 MG PO TABS: 4.0000 mg | ORAL_TABLET | Freq: Four times a day (QID) | ORAL | Status: DC | PRN

## 2012-08-25 MED ORDER — ENOXAPARIN SODIUM 40 MG/0.4ML ~~LOC~~ SOLN
40.0000 mg | SUBCUTANEOUS | Status: DC
  Administered 2012-08-25: 40 mg via SUBCUTANEOUS
  Filled 2012-08-25 (×2): qty 0.4

## 2012-08-25 MED ORDER — TECHNETIUM TO 99M ALBUMIN AGGREGATED
3.0000 | Freq: Once | INTRAVENOUS | Status: AC | PRN
  Administered 2012-08-25: 3 via INTRAVENOUS

## 2012-08-25 MED ORDER — SODIUM CHLORIDE 0.9 % IJ SOLN
3.0000 mL | Freq: Two times a day (BID) | INTRAMUSCULAR | Status: DC
  Administered 2012-08-27 – 2012-09-01 (×5): 3 mL via INTRAVENOUS

## 2012-08-25 MED ORDER — LEVALBUTEROL HCL 0.63 MG/3ML IN NEBU: 0.6300 mg | INHALATION_SOLUTION | Freq: Four times a day (QID) | RESPIRATORY_TRACT | Status: DC | PRN

## 2012-08-25 MED ORDER — SODIUM CHLORIDE 0.9 % IV SOLN: 250.0000 mL | INTRAVENOUS | Status: DC | PRN

## 2012-08-25 MED ORDER — IPRATROPIUM BROMIDE 0.02 % IN SOLN
0.5000 mg | Freq: Once | RESPIRATORY_TRACT | Status: AC
  Administered 2012-08-25: 0.5 mg via RESPIRATORY_TRACT
  Filled 2012-08-25: qty 2.5

## 2012-08-25 MED ORDER — SODIUM CHLORIDE 0.9 % IJ SOLN
3.0000 mL | INTRAMUSCULAR | Status: DC | PRN
  Administered 2012-08-26: 3 mL via INTRAVENOUS

## 2012-08-25 MED ORDER — SODIUM CHLORIDE 0.9 % IJ SOLN
3.0000 mL | Freq: Two times a day (BID) | INTRAMUSCULAR | Status: DC
  Administered 2012-08-25 – 2012-08-31 (×11): 3 mL via INTRAVENOUS

## 2012-08-25 MED ORDER — ACETAMINOPHEN 325 MG PO TABS
650.0000 mg | ORAL_TABLET | Freq: Four times a day (QID) | ORAL | Status: DC | PRN
  Administered 2012-08-26 – 2012-08-31 (×6): 650 mg via ORAL
  Filled 2012-08-25 (×8): qty 2

## 2012-08-25 MED ORDER — ALBUTEROL (5 MG/ML) CONTINUOUS INHALATION SOLN
10.0000 mg/h | INHALATION_SOLUTION | Freq: Once | RESPIRATORY_TRACT | Status: AC
  Administered 2012-08-25: 10 mg/h via RESPIRATORY_TRACT
  Filled 2012-08-25: qty 20

## 2012-08-25 MED ORDER — FUROSEMIDE 10 MG/ML IJ SOLN
40.0000 mg | Freq: Once | INTRAMUSCULAR | Status: AC
  Administered 2012-08-25: 40 mg via INTRAVENOUS
  Filled 2012-08-25: qty 4

## 2012-08-25 MED ORDER — ONDANSETRON HCL 4 MG/2ML IJ SOLN: 4.0000 mg | Freq: Four times a day (QID) | INTRAMUSCULAR | Status: DC | PRN

## 2012-08-25 MED ORDER — LEVOFLOXACIN IN D5W 750 MG/150ML IV SOLN
750.0000 mg | INTRAVENOUS | Status: AC
  Administered 2012-08-25 – 2012-08-27 (×2): 750 mg via INTRAVENOUS
  Filled 2012-08-25 (×2): qty 150

## 2012-08-25 MED ORDER — ACETAMINOPHEN 650 MG RE SUPP: 650.0000 mg | Freq: Four times a day (QID) | RECTAL | Status: DC | PRN

## 2012-08-25 NOTE — Progress Notes (Signed)
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  She has not seen Dr. Debby Bud in about 2 years.  Will call [facility] to see and admit her. 8:12 AM Family Practice will admit pt to a telemetry bed with Dr. Lum Babe as attending.

## 2012-08-25 NOTE — H&P (Addendum)
[facility] History and Physical  [name] XBJ:[id number] DOB: 03/20/1935 DOA: 08/25/2012  Referring physician:  CP: Illene Regulus, MD   Chief Complaint: Shortness of breath HPI:  78 year old female who presents with shortness of breath, she has a history of CHF  , prior history of CVA, she describes dyspnea on exertion for the last several days, she also states that this is accompanied with significant orthopnea. She also presents with generalized weakness no chest pain per se. In ED the patient has received Solu-Medrol and Lasix and feels much better. ProBNP is significantly elevated at 11,000. Last echo was in 2008 and he is to be repeated,    Past medical history Hemorrhagic stroke.  2. Lower gastrointestinal bleed, resolved.  3. Hypertension.  4. Gastroesophageal reflux disease.  5. Sinus bradycardia.  6. The patient reports history of surgery for neoplasm of the colon,  although there is no evidence on physical exam and there were no  records supporting her claim.  7. Coronary artery disease, status post stent.  8. Acute renal failure (questionable acute on chronic).     Review of Systems: negative for the following  Constitutional: Denies fever, chills, diaphoresis, appetite change and fatigue.  HEENT: Denies photophobia, eye pain, redness, hearing loss, ear pain, congestion, sore throat, rhinorrhea, sneezing, mouth sores, trouble swallowing, neck pain, neck stiffness and tinnitus.  Respiratory: Denies SOB, DOE, cough, chest tightness, and wheezing.  Cardiovascular: Denies chest pain, palpitations and leg swelling.  Gastrointestinal: Denies nausea, vomiting, abdominal pain, diarrhea, constipation, blood in stool and abdominal distention.  Genitourinary: Denies dysuria, urgency, frequency, hematuria, flank pain and difficulty urinating.  Musculoskeletal: Denies myalgias, back pain, joint swelling, arthralgias and gait problem.  Skin: Denies pallor, rash and  wound.  Neurological: Denies dizziness, seizures, syncope, weakness, light-headedness, numbness and headaches.  Hematological: Denies adenopathy. Easy bruising, personal or family bleeding history  Psychiatric/Behavioral: Denies suicidal ideation, mood changes, confusion, nervousness, sleep disturbance and agitation       History reviewed. No pertinent past medical history.   No past surgical history on file.    Social History:  has no tobacco, alcohol, and drug history on file.    No Known Allergies  No family history on file.   Prior to Admission medications   Medication Sig Start Date End Date Taking? Authorizing Provider  amLODipine (NORVASC) 5 MG tablet Take 5 mg by mouth daily.   Yes Historical Provider, MD  carvedilol (COREG) 6.25 MG tablet Take 6.25 mg by mouth 2 (two) times daily with a meal.   Yes Historical Provider, MD     Physical Exam: Filed Vitals:   08/25/12 0630 08/25/12 0714 08/25/12 0715 08/25/12 0800  BP:  181/64 181/64 187/54  Pulse: 79 71 82 84  Temp:      TempSrc:      Resp: 19 22 21 22   SpO2: 100% 96% 100% 96%     Constitutional: Vital signs reviewed. Patient is a well-developed and well-nourished in no acute distress and cooperative with exam. Alert and oriented x3.  Head: Normocephalic and atraumatic  Ear: TM normal bilaterally  Mouth: no erythema or exudates, MMM  Eyes: PERRL, EOMI, conjunctivae normal, No scleral icterus.  Neck: Supple, Trachea midline normal ROM, No JVD, mass, thyromegaly, or carotid bruit present.  Cardiovascular: RRR, S1 normal, S2 normal, no MRG, pulses symmetric and intact bilaterally  Pulmonary/Chest: CTAB, no wheezes, rales, or rhonchi  Abdominal: Soft. Non-tender, non-distended, bowel sounds are normal, no masses, organomegaly, or guarding present.  GU: no CVA tenderness Musculoskeletal: No joint deformities, erythema, or stiffness, ROM full and no nontender Ext: no edema and no cyanosis, pulses palpable  bilaterally (DP and PT)  Hematology: no cervical, inginal, or axillary adenopathy.  Neurological: A&O x3, Strenght is normal and symmetric bilaterally, cranial nerve II-XII are grossly intact, no focal motor deficit, sensory intact to light touch bilaterally.  Skin: Warm, dry and intact. No rash, cyanosis, or clubbing.  Psychiatric: Normal mood and affect. speech and behavior is normal. Judgment and thought content normal. Cognition and memory are normal.       Labs on Admission:    Basic Metabolic Panel:  Recent Labs Lab 08/25/12 0443  NA 141  K 4.6  CL 107  CO2 25  GLUCOSE 146*  BUN 52*  CREATININE 2.72*  CALCIUM 8.4   Liver Function Tests:  Recent Labs Lab 08/25/12 0443  AST 11  ALT 12  ALKPHOS 72  BILITOT 0.2*  PROT 6.8  ALBUMIN 3.1*   No results found for this basename: LIPASE, AMYLASE,  in the last 168 hours No results found for this basename: AMMONIA,  in the last 168 hours CBC:  Recent Labs Lab 08/25/12 0443  WBC 9.8  NEUTROABS 7.6  HGB 11.4*  HCT 36.5  MCV 83.3  PLT 264   Cardiac Enzymes:  Recent Labs Lab 08/25/12 0443  TROPONINI <0.30    BNP (last 3 results)  Recent Labs  08/25/12 0443  PROBNP 11398.0*      CBG: No results found for this basename: GLUCAP,  in the last 168 hours  Radiological Exams on Admission: Dg Chest Port 1 View  08/25/2012  *RADIOLOGY REPORT*  Clinical Data: Shortness of breath  PORTABLE CHEST - 1 VIEW  Comparison: 10/11/2006  Findings: Cardiomegaly.  Central vascular congestion. Aortic prominence.  Mild interstitial prominence.  Mild left lung base opacity.  Small effusions not excluded.  No pneumothorax. Multilevel degenerative change.  IMPRESSION: Cardiac enlargement and aortic prominence /tortuosity, increased from 2008.  Interstitial prominence may reflect interstitial edema.  Left lung base opacity; atelectasis versus infiltrate.  Small effusions not excluded.   Original Report Authenticated By: Jearld Lesch, M.D.     EKG: Independently reviewed.   Assessment/Plan Active Problems:   HYPERTENSION, BENIGN ESSENTIAL, LABILE   Acute exacerbation of CHF (congestive heart failure)   Shortness of breath. The patient does have an elevated BNP, we'll repeat 2-D echo, admit to telemetry, cycle cardiac enzymes, will also obtain a VQ scan to rule out pulmonary embolism, we'll not order a d-dimer as it will be probably elevated because of renal insufficiency. Chest x-ray shows Left lung base opacity; atelectasis versus infiltrate. Small effusions not excluded. Will start empiric antibiotics as well 1.   2.  hypertension patient is on Coreg and Norvasc which will be continued, also start the patient on as needed hydralazine  3. Acute on chronic kidney disease stage III-hold off on ACE inhibitor, continue diuresing with Lasix 40 IV twice a day, and monitor creatinine closely  Dr. Casimiro Needle norrins made aware about patient's admission  Code Status:   full Family Communication: bedside Disposition Plan: admit   Time spent: 70 mins   [place] [facility] Pager [phone]  If 7PM-7AM, please contact night-coverage [email or web address] Password [email or web address] 08/25/2012, 9:33 AM

## 2012-08-25 NOTE — ED Notes (Signed)
Pt  Brought to ED by EMS  With the complaint of SOB. As per pts son pt was SOB and found sitting in the toilet.Pt has been feeling unwell last few days.

## 2012-08-25 NOTE — Progress Notes (Signed)
9:23 AM [facility]PTS called me back, advised me that [name] is Dr. Debby Bud' patient.  I spoke to Dr. Debby Bud, who said that she is his patient, though she has not seen him in some time.  He requested that [facility] admit pt.  He will see pt as a social visit this evening and pick her up as his inpatient tomorrow.  I called Dr. Susie Cassette, [facility], who will admit pt.

## 2012-08-25 NOTE — ED Notes (Signed)
As per EMS pt was given solumedrol 125mg  iv, atrovent and of albuterol.

## 2012-08-25 NOTE — ED Provider Notes (Addendum)
History     CSN: [id number]  Arrival date & time 08/25/12  0435   First MD Initiated Contact with Patient 08/25/12 0435      Chief Complaint  Patient presents with  . Shortness of Breath    (Consider location/radiation/quality/duration/timing/severity/associated sxs/prior treatment) The history is provided by the patient.  [name] is a 78 y.o. female history of CVA, ? CHF, here with SOB. She said her legs up and was swollen for the last several days. She has shown to breath on exertion for several days. She said she also get more short of breath when she lays down. She also has generalized weakness for the last couple days. This evening her son found her sitting on the toilet with shortness of breath unable to get up. Denies any fevers or cough. Given 125 mg Solu-Medrol and 5 mg albuterol in route by EMS. No hx of COPD but patient is a smoker.    History reviewed. No pertinent past medical history.  No past surgical history on file.  No family history on file.  History  Substance Use Topics  . Smoking status: Not on file  . Smokeless tobacco: Not on file  . Alcohol Use: Not on file    OB History   Grav Para Term Preterm Abortions TAB SAB Ect Mult Living                  Review of Systems  Respiratory: Positive for shortness of breath.   All other systems reviewed and are negative.    Allergies  Review of patient's allergies indicates no known allergies.  Home Medications   Current Outpatient Rx  Name  Route  Sig  Dispense  Refill  . amLODipine (NORVASC) 5 MG tablet   Oral   Take 5 mg by mouth daily.         . carvedilol (COREG) 6.25 MG tablet   Oral   Take 6.25 mg by mouth 2 (two) times daily with a meal.           BP 174/59  Temp(Src) 97.7 F (36.5 C) (Oral)  Resp 17  SpO2 100%  Physical Exam  Nursing note and vitals reviewed. Constitutional: She is oriented to person, place, and time.  Tired, slightly tachypneic.   HENT:  Head:  Normocephalic.  Mouth/Throat: Oropharynx is clear and moist.  Eyes: Conjunctivae are normal. Pupils are equal, round, and reactive to light.  Neck: Normal range of motion. Neck supple.  Cardiovascular: Normal rate, regular rhythm and normal heart sounds.   Pulmonary/Chest:  Tachypneic, talking in 4-5 word sentences. + wheezing more on R side. Minimal bibasilar crackles.   Abdominal: Soft. Bowel sounds are normal. She exhibits no distension. There is no tenderness. There is no rebound and no guarding.  Musculoskeletal:  2+ edema bilateral legs   Neurological: She is alert and oriented to person, place, and time.  Skin: Skin is warm and dry.  Psychiatric: She has a normal mood and affect. Her behavior is normal. Judgment and thought content normal.    ED Course  Procedures (including critical care time)  Labs Reviewed  CBC WITH DIFFERENTIAL - Abnormal; Notable for the following:    Hemoglobin 11.4 (*)    RDW 16.8 (*)    All other components within normal limits  COMPREHENSIVE METABOLIC PANEL - Abnormal; Notable for the following:    Glucose, Bld 146 (*)    BUN 52 (*)    Creatinine, Ser 2.72 (*)  Albumin 3.1 (*)    Total Bilirubin 0.2 (*)    GFR calc non Af Amer 16 (*)    GFR calc Af Amer 19 (*)    All other components within normal limits  PRO B NATRIURETIC PEPTIDE - Abnormal; Notable for the following:    Pro B Natriuretic peptide (BNP) 11398.0 (*)    All other components within normal limits  TROPONIN I   No results found.   No diagnosis found.   Date: 08/25/2012  Rate: 72  Rhythm: normal sinus rhythm  QRS Axis: normal  Intervals: normal  ST/T Wave abnormalities: nonspecific ST changes  Conduction Disutrbances:nonspecific intraventricular conduction delay  Narrative Interpretation: + PVCs  Old EKG Reviewed: none available    MDM  [name] is a 78 y.o. female here with SOB. Concerned for possible bronchitis vs COPD (given smoker), vs CHF. Will check  BNP, labs, CXR. Will give nebs and reassess.   5:43 AM CXR showed mild failure. BNP 11000. I think she has new onset CHF. She may also have COPD exacerbation (she is never diagnosed with COPD). I discussed with Dr. Eben Burow, who accepted the patient on tele.        Richardean Canal, MD 08/25/12 4098  Richardean Canal, MD 08/25/12 0630

## 2012-08-25 NOTE — ED Notes (Signed)
Ambulated to bathroom with assistance, became short of breath when walking.

## 2012-08-25 NOTE — ED Notes (Signed)
EKG given to Dr. Silverio Lay. Copy placed in pt chart.

## 2012-08-25 NOTE — ED Notes (Signed)
Spoke with granddaughter in law on phone--- states when power was off from Friday morning until Sunday night and pt was unable to get up to go to bathroom during that time, due to weakness.

## 2012-08-25 NOTE — ED Notes (Signed)
EKG not completed upon 10 minutes of arrival time because of equipment failure.

## 2012-08-26 DIAGNOSIS — I10 Essential (primary) hypertension: Secondary | ICD-10-CM

## 2012-08-26 DIAGNOSIS — I509 Heart failure, unspecified: Secondary | ICD-10-CM

## 2012-08-26 DIAGNOSIS — I2510 Atherosclerotic heart disease of native coronary artery without angina pectoris: Secondary | ICD-10-CM

## 2012-08-26 LAB — BASIC METABOLIC PANEL
CO2: 23 mEq/L (ref 19–32)
CO2: 26 mEq/L (ref 19–32)
Calcium: 7.6 mg/dL — ABNORMAL LOW (ref 8.4–10.5)
Chloride: 102 mEq/L (ref 96–112)
GFR calc Af Amer: 16 mL/min — ABNORMAL LOW (ref >90)
GFR calc non Af Amer: 14 mL/min — ABNORMAL LOW (ref >90)
Glucose, Bld: 94 mg/dL (ref 70–99)
Potassium: 3.9 mEq/L (ref 3.5–5.1)
Sodium: 142 mEq/L (ref 135–145)
Sodium: 137 mEq/L (ref 135–145)

## 2012-08-26 LAB — GLUCOSE, CAPILLARY
Glucose-Capillary: 98 mg/dL (ref 70–99)
Glucose-Capillary: 110 mg/dL — ABNORMAL HIGH (ref 70–99)
Glucose-Capillary: 113 mg/dL — ABNORMAL HIGH (ref 70–99)

## 2012-08-26 LAB — CBC
Hemoglobin: 10.3 g/dL — ABNORMAL LOW (ref 12.0–15.0)
MCH: 25.4 pg — ABNORMAL LOW (ref 26.0–34.0)
RBC: 4.05 MIL/uL (ref 3.87–5.11)

## 2012-08-26 LAB — TROPONIN I: Troponin I: <0.3 ng/mL (ref <0.30)

## 2012-08-26 MED ORDER — ATORVASTATIN CALCIUM 20 MG PO TABS
20.0000 mg | ORAL_TABLET | Freq: Every day | ORAL | Status: DC
  Administered 2012-08-26 – 2012-09-01 (×7): 20 mg via ORAL
  Filled 2012-08-26 (×8): qty 1

## 2012-08-26 MED ORDER — ASPIRIN 81 MG PO CHEW
81.0000 mg | CHEWABLE_TABLET | Freq: Every day | ORAL | Status: DC
  Administered 2012-08-26 – 2012-09-01 (×7): 81 mg via ORAL
  Filled 2012-08-26 (×5): qty 1

## 2012-08-26 MED ORDER — ISOSORBIDE MONONITRATE ER 30 MG PO TB24
30.0000 mg | ORAL_TABLET | Freq: Every day | ORAL | Status: DC
  Administered 2012-08-26 – 2012-08-29 (×4): 30 mg via ORAL
  Filled 2012-08-26 (×4): qty 1

## 2012-08-26 MED ORDER — ENOXAPARIN SODIUM 30 MG/0.3ML ~~LOC~~ SOLN
30.0000 mg | SUBCUTANEOUS | Status: DC
Start: 2012-08-26 — End: 2012-09-02
  Administered 2012-08-26 – 2012-09-01 (×7): 30 mg via SUBCUTANEOUS
  Filled 2012-08-26 (×9): qty 0.3

## 2012-08-26 NOTE — Progress Notes (Signed)
Subjective: Patient admitted with acute CHF. She has not seen Dr. Debby Bud for 2 + years. She reports that she has been doing OK - working in the yard. She reports that for the last 3 weeks she has had progressive SOB and episodes of hard, dull pain in the left chest. She would get relief with Nitroglycerin. On the day of admission her chest pain was much worse, unrelieved by 3 nitroglycerin prompting her to come to the ED. Tropinins have been negative x 3. Her BNP was 11,400. She reports that she has had episodes of chest pain since admission. Currently she is pain free. She does report that she has had PCI with stent x 2: 2001 and 2008.  Past Medical History  Diagnosis Date  . Anginal pain   . Myocardial infarction   . Hypertension   . CHF (congestive heart failure)   . Anemia   . Stroke   . Pneumonia   . Shortness of breath   . GERD (gastroesophageal reflux disease)   . Headache   . Cancer   . Arthritis   . Depression    Past Surgical History  Procedure Laterality Date  . Appendectomy    . Tonsillectomy    . Abdominal hysterectomy    . Cholecystectomy    . Eye surgery    . Colon surgery     History   Social History  . Marital Status: Divorced    Spouse Name: N/A    Number of Children: N/A  . Years of Education: N/A   Occupational History  . Not on file.   Social History Main Topics  . Smoking status: Current Every Day Smoker    Types: Cigarettes  . Smokeless tobacco: Never Used  . Alcohol Use: No  . Drug Use: No  . Sexually Active: Not on file   Other Topics Concern  . Not on file   Social History Narrative  . No narrative on file       Objective: Lab: Lab Results  Component Value Date   WBC 9.7 08/26/2012   HGB 10.3* 08/26/2012   HCT 32.3* 08/26/2012   MCV 79.8 08/26/2012   PLT 260 08/26/2012   BMET    Component Value Date/Time   NA 141 08/25/2012 0443   K 4.6 08/25/2012 0443   CL 107 08/25/2012 0443   CO2 25 08/25/2012 0443   GLUCOSE 146* 08/25/2012  0443   BUN 52* 08/25/2012 0443   CREATININE 2.70* 08/25/2012 1742   CALCIUM 8.4 08/25/2012 0443   GFRNONAA 16* 08/25/2012 1742   GFRAA 19* 08/25/2012 1742   Cardiac Panel (last 3 results)  Recent Labs  08/25/12 1742 08/26/12 0046 08/26/12 0630  TROPONINI <0.30 <0.30 <0.30   pBNP 11131   Imaging: 2 D echo 08/25/12: Study Conclusions  - Procedure narrative: Transthoracic echocardiography. Image quality was adequate. The study was technically difficult, as a result of poor acoustic windows and poor sound wave transmission. - Left ventricle: The cavity size was normal. There was severe concentric hypertrophy. Systolic function was mildly to moderately reduced. The estimated ejection fraction was in the range of 40% to 45%. Incoordinate septal motion. Inferolateral hypokinesis. Doppler parameters are consistent with abnormal left ventricular relaxation (grade 1 diastolic dysfunction). The E/e' ratio is ~10, suggesting borderline increased LV filling pressure. - Left atrium: Moderately dilated (39 ml/m2). - Inferior vena cava: The vessel was normal in size; the respirophasic diameter changes were in the normal range (= 50%); findings are consistent with  normal central venous pressure.   Scheduled Meds: . amLODipine  5 mg Oral Daily  . carvedilol  6.25 mg Oral BID WC  . enoxaparin (LOVENOX) injection  40 mg Subcutaneous Q24H  . furosemide  40 mg Intravenous Q12H  . levofloxacin (LEVAQUIN) IV  750 mg Intravenous Q48H  . sodium chloride  3 mL Intravenous Q12H  . sodium chloride  3 mL Intravenous Q12H   Continuous Infusions:  PRN Meds:.sodium chloride, acetaminophen, acetaminophen, levalbuterol, ondansetron (ZOFRAN) IV, ondansetron, sodium chloride   Physical Exam: Filed Vitals:   08/26/12 0514  BP: 142/51  Pulse: 68  Temp: 97.8 F (36.6 C)  Resp: 18   Wt Readings from Last 3 Encounters:  08/26/12 222 lb 4.8 oz (100.835 kg)  08/07/10 213 lb (96.616 kg)  07/24/10 218  lb (98.884 kg)   \  Intake/Output Summary (Last 24 hours) at 08/26/12 0744 Last data filed at 08/25/12 1754  Gross per 24 hour  Intake    240 ml  Output      0 ml  Net    240 ml    Gen'l - morbidly obese white woman on the side of the bed in no distress HEENT - C&S clear Cor- 2+ radial, heart sounds distant but regular Pulm - very decreased BS, no rales or wheezes appreciated. No increased WOB Abd- obese Neuro - A&O, oriented to person and place.     Assessment/Plan: 1. Cardiac - patient is a poor historian and the records are incomplete: she moved to this area around 2008. She does report h/o CAD with stent placement (vessel?) in 2001 in [place]; PCI with stent (LAD?) in [place] [facility] [place] She has a 3 week history suggestive of crescendo angina culminating in admission after failure to get relief after 3 nitro tabs. Cardiac enzymes have been negative but she has CHF and an abnormal 2-D echo.  Plan Diuresis - given her obesity and renal insufficiency will increase lasix to 80 q 8  Unstable crescendo angina - will request cardiology consult  2. HTN - mildly elevated today. ACE was stopped at admission.   Plan  Continue amlodipine, coreg and diuretic  3. Renal failure CKD III Cr July '08 1.34, Jan '12  2.38, Feb '12 2.2 and now 2.7. In face of BNP elevation and CHF will need to diurese but will closely follow renal function.   Plan F/u lab  For continued rise in Cr will need renal consult.  Will request records be obtained from [facility]. Will search centricity for old records.    Illene Regulus [place] [facility] (o) [phone]; (c) [phone] Call-grp - [facility]  Tele: [phone]  08/26/2012, 7:04 AM

## 2012-08-26 NOTE — Progress Notes (Signed)
Utilization Review Completed.   Daren Doswell, RN, BSN Nurse Case Manager  [phone]  

## 2012-08-26 NOTE — Consult Note (Signed)
Reason for Consult: Chest pain and CHF Referring Physician: Dr. [name] is an 78 y.o. female.  HPI: This 78 year old woman was admitted to the hospital on 08/25/12 because of worsening shortness of breath for the past 3 weeks as well as some episodes of sided dull chest discomfort relieved by nitroglycerin.  She has a past history of ischemic heart disease.  She states that she had PCI with stent in [place] in 2001 and she had another catheterization with possible balloon or stent in 2008.  That procedure was done at [facility] and we are trying to get those records.  The patient has a past history of congestive heart failure.  Workup of this admission shows mixed systolic and diastolic left ventricular dysfunction with ejection fraction of 40-45% and grade 1 diastolic dysfunction.  Her cardiac enzymes are negative for acute myocardial infarction.  The patient has significant renal dysfunction with creatinine of 3.03.  The patient has ongoing tobacco abuse and smokes 2 packs a week.  Past Medical History  Diagnosis Date  . Anginal pain   . Myocardial infarction   . Hypertension   . CHF (congestive heart failure)   . Anemia   . Stroke   . Pneumonia   . Shortness of breath   . GERD (gastroesophageal reflux disease)   . Headache   . Cancer   . Arthritis   . Depression     Past Surgical History  Procedure Laterality Date  . Appendectomy    . Tonsillectomy    . Abdominal hysterectomy    . Cholecystectomy    . Eye surgery    . Colon surgery      History reviewed. No pertinent family history.  Social History:  reports that she has been smoking Cigarettes.  She has been smoking about 0.00 packs per day. She has never used smokeless tobacco. She reports that she does not drink alcohol or use illicit drugs.  Allergies: No Known Allergies  Medications:  Scheduled: . amLODipine  5 mg Oral Daily  . carvedilol  6.25 mg Oral BID WC  . enoxaparin (LOVENOX)  injection  30 mg Subcutaneous Q24H  . furosemide  40 mg Intravenous Q12H  . levofloxacin (LEVAQUIN) IV  750 mg Intravenous Q48H  . sodium chloride  3 mL Intravenous Q12H  . sodium chloride  3 mL Intravenous Q12H    Results for orders placed during the hospital encounter of 08/25/12 (from the past 48 hour(s))  CBC WITH DIFFERENTIAL     Status: Abnormal   Collection Time    08/25/12  4:43 AM      Result Value Range   WBC 9.8  4.0 - 10.5 K/uL   RBC 4.38  3.87 - 5.11 MIL/uL   Hemoglobin 11.4 (*) 12.0 - 15.0 g/dL   HCT 16.1  09.6 - 04.5 %   MCV 83.3  78.0 - 100.0 fL   MCH 26.0  26.0 - 34.0 pg   MCHC 31.2  30.0 - 36.0 g/dL   RDW 40.9 (*) 81.1 - 91.4 %   Platelets 264  150 - 400 K/uL   Neutrophils Relative 77  43 - 77 %   Neutro Abs 7.6  1.7 - 7.7 K/uL   Lymphocytes Relative 12  12 - 46 %   Lymphs Abs 1.2  0.7 - 4.0 K/uL   Monocytes Relative 6  3 - 12 %   Monocytes Absolute 0.6  0.1 - 1.0 K/uL   Eosinophils Relative 5  0 - 5 %   Eosinophils Absolute 0.5  0.0 - 0.7 K/uL   Basophils Relative 0  0 - 1 %   Basophils Absolute 0.0  0.0 - 0.1 K/uL  COMPREHENSIVE METABOLIC PANEL     Status: Abnormal   Collection Time    08/25/12  4:43 AM      Result Value Range   Sodium 141  135 - 145 mEq/L   Potassium 4.6  3.5 - 5.1 mEq/L   Chloride 107  96 - 112 mEq/L   CO2 25  19 - 32 mEq/L   Glucose, Bld 146 (*) 70 - 99 mg/dL   BUN 52 (*) 6 - 23 mg/dL   Creatinine, Ser 5.78 (*) 0.50 - 1.10 mg/dL   Calcium 8.4  8.4 - 46.9 mg/dL   Total Protein 6.8  6.0 - 8.3 g/dL   Albumin 3.1 (*) 3.5 - 5.2 g/dL   AST 11  0 - 37 U/L   ALT 12  0 - 35 U/L   Alkaline Phosphatase 72  39 - 117 U/L   Total Bilirubin 0.2 (*) 0.3 - 1.2 mg/dL   GFR calc non Af Amer 16 (*) >90 mL/min   GFR calc Af Amer 19 (*) >90 mL/min   Comment:            The eGFR has been calculated     using the CKD EPI equation.     This calculation has not been     validated in all clinical     situations.     eGFR's persistently     <90  mL/min signify     possible Chronic Kidney Disease.  TROPONIN I     Status: None   Collection Time    08/25/12  4:43 AM      Result Value Range   Troponin I <0.30  <0.30 ng/mL   Comment:            Due to the release kinetics of cTnI,     a negative result within the first hours     of the onset of symptoms does not rule out     myocardial infarction with certainty.     If myocardial infarction is still suspected,     repeat the test at appropriate intervals.  PRO B NATRIURETIC PEPTIDE     Status: Abnormal   Collection Time    08/25/12  4:43 AM      Result Value Range   Pro B Natriuretic peptide (BNP) 11398.0 (*) 0 - 450 pg/mL  TSH     Status: None   Collection Time    08/25/12  9:47 AM      Result Value Range   TSH 0.813  0.350 - 4.500 uIU/mL  CBC     Status: Abnormal   Collection Time    08/25/12  5:42 PM      Result Value Range   WBC 7.8  4.0 - 10.5 K/uL   RBC 4.20  3.87 - 5.11 MIL/uL   Hemoglobin 10.8 (*) 12.0 - 15.0 g/dL   HCT 62.9 (*) 52.8 - 41.3 %   MCV 82.9  78.0 - 100.0 fL   MCH 25.7 (*) 26.0 - 34.0 pg   MCHC 31.0  30.0 - 36.0 g/dL   RDW 24.4 (*) 01.0 - 27.2 %   Platelets 309  150 - 400 K/uL  CREATININE, SERUM     Status: Abnormal   Collection Time    08/25/12  5:42 PM      Result Value Range   Creatinine, Ser 2.70 (*) 0.50 - 1.10 mg/dL   GFR calc non Af Amer 16 (*) >90 mL/min   GFR calc Af Amer 19 (*) >90 mL/min   Comment:            The eGFR has been calculated     using the CKD EPI equation.     This calculation has not been     validated in all clinical     situations.     eGFR's persistently     <90 mL/min signify     possible Chronic Kidney Disease.  TSH     Status: None   Collection Time    08/25/12  5:42 PM      Result Value Range   TSH 0.625  0.350 - 4.500 uIU/mL  TROPONIN I     Status: None   Collection Time    08/25/12  5:42 PM      Result Value Range   Troponin I <0.30  <0.30 ng/mL   Comment:            Due to the release kinetics  of cTnI,     a negative result within the first hours     of the onset of symptoms does not rule out     myocardial infarction with certainty.     If myocardial infarction is still suspected,     repeat the test at appropriate intervals.  GLUCOSE, CAPILLARY     Status: Abnormal   Collection Time    08/25/12  8:32 PM      Result Value Range   Glucose-Capillary 119 (*) 70 - 99 mg/dL   Comment 1 Documented in Chart     Comment 2 Notify RN    TROPONIN I     Status: None   Collection Time    08/26/12 12:46 AM      Result Value Range   Troponin I <0.30  <0.30 ng/mL   Comment:            Due to the release kinetics of cTnI,     a negative result within the first hours     of the onset of symptoms does not rule out     myocardial infarction with certainty.     If myocardial infarction is still suspected,     repeat the test at appropriate intervals.  GLUCOSE, CAPILLARY     Status: None   Collection Time    08/26/12  6:15 AM      Result Value Range   Glucose-Capillary 98  70 - 99 mg/dL  TROPONIN I     Status: None   Collection Time    08/26/12  6:30 AM      Result Value Range   Troponin I <0.30  <0.30 ng/mL   Comment:            Due to the release kinetics of cTnI,     a negative result within the first hours     of the onset of symptoms does not rule out     myocardial infarction with certainty.     If myocardial infarction is still suspected,     repeat the test at appropriate intervals.  PRO B NATRIURETIC PEPTIDE     Status: Abnormal   Collection Time    08/26/12  6:30 AM      Result Value Range   Pro B  Natriuretic peptide (BNP) 11131.0 (*) 0 - 450 pg/mL  BASIC METABOLIC PANEL     Status: Abnormal   Collection Time    08/26/12  6:30 AM      Result Value Range   Sodium 142  135 - 145 mEq/L   Potassium 3.9  3.5 - 5.1 mEq/L   Chloride 106  96 - 112 mEq/L   CO2 23  19 - 32 mEq/L   Glucose, Bld 94  70 - 99 mg/dL   BUN 56 (*) 6 - 23 mg/dL   Creatinine, Ser 9.62 (*) 0.50 -  1.10 mg/dL   Calcium 7.9 (*) 8.4 - 10.5 mg/dL   GFR calc non Af Amer 14 (*) >90 mL/min   GFR calc Af Amer 16 (*) >90 mL/min   Comment:            The eGFR has been calculated     using the CKD EPI equation.     This calculation has not been     validated in all clinical     situations.     eGFR's persistently     <90 mL/min signify     possible Chronic Kidney Disease.  CBC     Status: Abnormal   Collection Time    08/26/12  6:30 AM      Result Value Range   WBC 9.7  4.0 - 10.5 K/uL   RBC 4.05  3.87 - 5.11 MIL/uL   Hemoglobin 10.3 (*) 12.0 - 15.0 g/dL   HCT 95.2 (*) 84.1 - 32.4 %   MCV 79.8  78.0 - 100.0 fL   MCH 25.4 (*) 26.0 - 34.0 pg   MCHC 31.9  30.0 - 36.0 g/dL   RDW 40.1 (*) 02.7 - 25.3 %   Platelets 260  150 - 400 K/uL    Nm Pulmonary Perfusion  08/25/2012  *RADIOLOGY REPORT*  Clinical Data: Shortness of breath  NM PULMONARY PERFUSION PARTICULATE  Views:  Anterior, posterior, right lateral, left lateral, RAO, LAO, RPO, LPO.  Radiopharmaceutical: Technetium 38m macroaggregated albumin    Dose:  3.0 mCi  Route of administration:  Intravenous  Comparison:  Chest radiograph August 26, 2011  Findings:  The patient could not tolerate ventilation study.  The distribution of radiotracer uptake on the perfusion study is homogeneous and symmetric bilaterally.  There is evidence of cardiomegaly.  IMPRESSION: No appreciable perfusion defects.  Very low probability of pulmonary embolus.   Original Report Authenticated By: Bretta Bang, M.D.    Dg Chest Port 1 View  08/25/2012  *RADIOLOGY REPORT*  Clinical Data: Shortness of breath  PORTABLE CHEST - 1 VIEW  Comparison: 10/11/2006  Findings: Cardiomegaly.  Central vascular congestion. Aortic prominence.  Mild interstitial prominence.  Mild left lung base opacity.  Small effusions not excluded.  No pneumothorax. Multilevel degenerative change.  IMPRESSION: Cardiac enlargement and aortic prominence /tortuosity, increased from 2008.   Interstitial prominence may reflect interstitial edema.  Left lung base opacity; atelectasis versus infiltrate.  Small effusions not excluded.   Original Report Authenticated By: Jearld Lesch, M.D.     Review of systems: She denies any recent evidence of hematochezia or melena.  He does have a past history of GI bleeding from her colon.  She has had a chronic nonproductive cough. Blood pressure 142/51, pulse 68, temperature 97.8 F (36.6 C), temperature source Oral, resp. rate 18, height 5\' 5"  (1.651 m), weight 222 lb 4.8 oz (100.835 kg), SpO2 99.00%. The patient appears to be  in no distress.  She appears older than her stated age.  Head and neck exam reveals that the pupils are equal and reactive.  The extraocular movements are full.  There is no scleral icterus.  Mouth and pharynx are benign.  No lymphadenopathy.  No carotid bruits.  The jugular venous pressure is normal.  Thyroid is not enlarged or tender.  Chest reveals distant breath sounds.  Mild expiratory wheezing.  Heart reveals no abnormal lift or heave.  First and second heart sounds are normal.  There is no murmur gallop rub or click.  The abdomen is soft and nontender.  Bowel sounds are normoactive.  There is no hepatosplenomegaly or mass.  There are no abdominal bruits.  Extremities chronic pedal edema.  Evidence of prior discoloration and scarring of the right lower extremity.  Pedal pulses are equivocal Neurologic exam is normal strength and no lateralizing weakness.  No sensory deficits.  Integument reveals no rash  EKG shows normal sinus rhythm with occasional PVC and nonspecific ST-T wave changes.  Two-dimensional echocardiogram: Study Conclusions  - Procedure narrative: Transthoracic echocardiography. Image quality was adequate. The study was technically difficult, as a result of poor acoustic windows and poor sound wave transmission. - Left ventricle: The cavity size was normal. There was severe concentric  hypertrophy. Systolic function was mildly to moderately reduced. The estimated ejection fraction was in the range of 40% to 45%. Incoordinate septal motion. Inferolateral hypokinesis. Doppler parameters are consistent with abnormal left ventricular relaxation (grade 1 diastolic dysfunction). The E/e' ratio is ~10, suggesting borderline increased LV filling pressure. - Left atrium: Moderately dilated (39 ml/m2). - Inferior vena cava: The vessel was normal in size; the respirophasic diameter changes were in the normal range (= 50%); findings are consistent with normal central venous pressure.    Assessment/Plan: 1. ischemic heart disease history of prior coronary artery stents.  Recent chest pain with negative cardiac enzymes and EKG which is nonacute.  She is not a candidate for cardiac catheterization because of her renal insufficiency.  We will plan to maximize her medical therapy including statins, beta blockers, aspirin, and nitrates.  Tobacco cessation advised. 2. congestive heart failure secondary to mixed systolic and diastolic acute on chronic CHF ejection fraction 40-45%.  Plan continue efforts to diurese watch renal function closely 3. COPD with ongoing cigarette abuse. 4. chronic renal insufficiency 5. hypertensive cardiovascular disease 6. possible diabetes  Will follow with you.  Cassell Clement 08/26/2012, 11:11 AM

## 2012-08-27 LAB — BASIC METABOLIC PANEL
BUN: 59 mg/dL — ABNORMAL HIGH (ref 6–23)
Calcium: 7.7 mg/dL — ABNORMAL LOW (ref 8.4–10.5)
GFR calc Af Amer: 16 mL/min — ABNORMAL LOW (ref >90)
GFR calc non Af Amer: 13 mL/min — ABNORMAL LOW (ref >90)
Potassium: 3.5 mEq/L (ref 3.5–5.1)

## 2012-08-27 LAB — GLUCOSE, CAPILLARY: Glucose-Capillary: 85 mg/dL (ref 70–99)

## 2012-08-27 LAB — LIPID PANEL
LDL Cholesterol: 36 mg/dL (ref 0–99)
Total CHOL/HDL Ratio: 2.1 RATIO
VLDL: 25 mg/dL (ref 0–40)

## 2012-08-27 MED ORDER — LEVOFLOXACIN 500 MG PO TABS
500.0000 mg | ORAL_TABLET | ORAL | Status: DC
  Administered 2012-08-29 – 2012-08-31 (×2): 500 mg via ORAL
  Filled 2012-08-27 (×2): qty 1

## 2012-08-27 NOTE — Progress Notes (Signed)
Subjective: Patient reports she is feeling better. She denies chest pain. She does not feel so short of breath. Her legs are still swelling.   Objective: Lab: Lab Results  Component Value Date   WBC 9.7 08/26/2012   HGB 10.3* 08/26/2012   HCT 32.3* 08/26/2012   MCV 79.8 08/26/2012   PLT 260 08/26/2012   BMET    Component Value Date/Time   NA 141 08/27/2012 0539   K 3.5 08/27/2012 0539   CL 104 08/27/2012 0539   CO2 26 08/27/2012 0539   GLUCOSE 86 08/27/2012 0539   BUN 59* 08/27/2012 0539   CREATININE 3.14* 08/27/2012 0539   CALCIUM 7.7* 08/27/2012 0539   GFRNONAA 13* 08/27/2012 0539   GFRAA 16* 08/27/2012 0539     Imaging:  Scheduled Meds: . amLODipine  5 mg Oral Daily  . aspirin  81 mg Oral Daily  . atorvastatin  20 mg Oral q1800  . carvedilol  6.25 mg Oral BID WC  . enoxaparin (LOVENOX) injection  30 mg Subcutaneous Q24H  . furosemide  40 mg Intravenous Q12H  . isosorbide mononitrate  30 mg Oral Daily  . levofloxacin (LEVAQUIN) IV  750 mg Intravenous Q48H  . sodium chloride  3 mL Intravenous Q12H  . sodium chloride  3 mL Intravenous Q12H   Continuous Infusions:  PRN Meds:.sodium chloride, acetaminophen, acetaminophen, levalbuterol, ondansetron (ZOFRAN) IV, ondansetron, sodium chloride   Physical Exam: Filed Vitals:   08/27/12 0518  BP: 152/50  Pulse: 62  Temp: 97.7 F (36.5 C)  Resp: 18   gen'l - elderly white woman in no distress HEENT- C&S clear Cor - 2+ radial pulse, RRR, no murmur Pulm - decreased BS, no rales or wheezes Abd- obese Ext - 2+ pitting edema Derm - chronic venous stasis changes both legs     Assessment/Plan: 1. Cardiac - no chest pain. Hemodynamically stable. Has diuresed about 2.5 liters. She feels better  Plan Will continue diuresis today - renal function holding steady  Continue medical management of angina  2. HTN - stable BP off RAS drugs  3. CKD III - Cr essentially unchanged.  Plan AM Renal panel  4. Dispo - PT/OT  eval   Illene Regulus [place] [facility] (o) [phone]; (c) [phone] Call-grp - [facility]  Tele: [phone]  08/27/2012, 7:38 AM

## 2012-08-27 NOTE — Progress Notes (Signed)
Subjective:  Patient states she feels better, is breathing better.  No chest pain.  Objective:  Vital Signs in the last 24 hours: Temp:  [97.7 F (36.5 C)-98.3 F (36.8 C)] 97.7 F (36.5 C) (03/13 0518) Pulse Rate:  [56-62] 62 (03/13 0518) Resp:  [18-20] 18 (03/13 0518) BP: (143-166)/(50-75) 152/50 mmHg (03/13 0518) SpO2:  [90 %-95 %] 90 % (03/13 0518) Weight:  [220 lb 8 oz (100.018 kg)] 220 lb 8 oz (100.018 kg) (03/13 0518)  Intake/Output from previous day: 03/12 0701 - 03/13 0700 In: 960 [P.O.:960] Out: 2851 [Urine:2850; Stool:1] Intake/Output from this shift: Total I/O In: -  Out: 800 [Urine:800]  . amLODipine  5 mg Oral Daily  . aspirin  81 mg Oral Daily  . atorvastatin  20 mg Oral q1800  . carvedilol  6.25 mg Oral BID WC  . enoxaparin (LOVENOX) injection  30 mg Subcutaneous Q24H  . furosemide  40 mg Intravenous Q12H  . isosorbide mononitrate  30 mg Oral Daily  . levofloxacin (LEVAQUIN) IV  750 mg Intravenous Q48H  . sodium chloride  3 mL Intravenous Q12H  . sodium chloride  3 mL Intravenous Q12H      Physical Exam: The patient appears to be in no distress. Head and neck exam reveals that the pupils are equal and reactive. The extraocular movements are full. There is no scleral icterus. Mouth and pharynx are benign. No lymphadenopathy. No carotid bruits. The jugular venous pressure is normal. Thyroid is not enlarged or tender.  Chest reveals distant breath sounds. Mild expiratory wheezing.  Heart reveals no abnormal lift or heave. First and second heart sounds are normal. There is no murmur gallop rub or click.  The abdomen is soft and nontender. Bowel sounds are normoactive. There is no hepatosplenomegaly or mass. There are no abdominal bruits.  Extremities chronic pedal edema. Evidence of prior discoloration and scarring of the right lower extremity. Pedal pulses are equivocal  Neurologic exam is normal strength and no lateralizing weakness. No sensory deficits.    Integument reveals no rash   Lab Results:  Recent Labs  08/25/12 1742 08/26/12 0630  WBC 7.8 9.7  HGB 10.8* 10.3*  PLT 309 260    Recent Labs  08/26/12 1629 08/27/12 0539  NA 137 141  K 4.1 3.5  CL 102 104  CO2 26 26  GLUCOSE 95 86  BUN 57* 59*  CREATININE 3.03* 3.14*    Recent Labs  08/26/12 0046 08/26/12 0630  TROPONINI <0.30 <0.30   Hepatic Function Panel  Recent Labs  08/25/12 0443  PROT 6.8  ALBUMIN 3.1*  AST 11  ALT 12  ALKPHOS 72  BILITOT 0.2*    Recent Labs  08/27/12 0539  CHOL 115   No results found for this basename: PROTIME,  in the last 72 hours  Imaging: Imaging results have been reviewed  Cardiac Studies: Telemetry shows NSR. Assessment/Plan:  1. ischemic heart disease history of prior coronary artery stents. Recent chest pain with negative cardiac enzymes and EKG which is nonacute. She is not a candidate for cardiac catheterization because of her renal insufficiency. We will plan to maximize her medical therapy including statins, beta blockers, aspirin, and nitrates. Tobacco cessation advised.  2. congestive heart failure secondary to mixed systolic and diastolic acute on chronic CHF ejection fraction 40-45%. Plan continue efforts to diurese watch renal function closely.  Weight down 2 lbs since yesterday.  3. COPD with ongoing cigarette abuse.  4. chronic renal insufficiency.  Creatinine about the same today.    5. hypertensive cardiovascular disease   Plan: continue diuresis.   LOS: 2 days    Cassell Clement 08/27/2012, 8:10 AM

## 2012-08-27 NOTE — Progress Notes (Addendum)
PHARMACIST - PHYSICIAN COMMUNICATION  CONCERNING: Antibiotic IV to Oral Route Change Policy  RECOMMENDATION: This patient is receiving Levaquin by the intravenous route.  Based on criteria approved by the Pharmacy and Therapeutics Committee, the antibiotic(s) is/are being converted to the equivalent oral dose form(s).   DESCRIPTION: These criteria include:  Patient being treated for a respiratory tract infection, urinary tract infection, or cellulitis  The patient is not neutropenic and does not exhibit a GI malabsorption state  The patient is eating (either orally or via tube) and/or has been taking other orally administered medications for a least 24 hours  The patient is improving clinically and has a Tmax < 100.5  If you have questions about this conversion, please contact the Pharmacy Department  []   [phone] )  [facility] [x]   [phone] )  [facility]  []   [phone] )  [facility] []   [phone] )  [facility]    Also noted patient's CrCl ~ 20 ml/min.  Will adjust Levaquin dose to 500mg  PO Q48 hours with next dose due 3/15.  [employer], Pharm.D., BCPS Clinical Pharmacist Pager [phone] 08/27/2012 11:23 AM

## 2012-08-27 NOTE — Evaluation (Addendum)
Physical Therapy Evaluation Patient Details Name: [name] MRN: [id number] DOB: 1934/08/21 Today's Date: 08/27/2012 Time: 2841-3244 PT Time Calculation (min): 26 min  PT Assessment / Plan / Recommendation Clinical Impression  Patient is a 78 y/o female admitted with shortness of breath h/o CHF, CKD, HTN, CVA and CAD.  She presents with decreased independence with mobility due to LE weakness and swelling with decreased sensation, decreased balance, decreased LE strength and will benefit from skilled PT in the acute setting to maximzie independence and safety for d/c home with family and HHPT.    PT Assessment  Patient needs continued PT services    Follow Up Recommendations  Home health PT;Supervision - Intermittent    Does the patient have the potential to tolerate intense rehabilitation    N/A  Barriers to Discharge  None      Equipment Recommendations  Rolling walker with 5" wheels    Recommendations for Other Services   OT eval  Frequency Min 3X/week    Precautions / Restrictions Precautions Precautions: Fall   Pertinent Vitals/Pain C/o severe low right sided back and chest pain after ambulation RN aware      Mobility  Bed Mobility Bed Mobility: Not assessed Details for Bed Mobility Assistance: pt sitting on edge of bed Transfers Transfers: Sit to Stand;Stand to Sit Sit to Stand: 6: Modified independent (Device/Increase time);From bed;From chair/3-in-1;With upper extremity assist Stand to Sit: To chair/3-in-1;5: Supervision Details for Transfer Assistance: uncontrolled descent into low chair, needs cues for safety Ambulation/Gait Ambulation/Gait Assistance: 4: Min guard Ambulation Distance (Feet): 90 Feet Assistive device: Rolling walker Ambulation/Gait Assistance Details: started without walker and pt with unsteadiness and c/o feet numb.  with walker most of distance with supervision until c/o chst and right side back pain (RN informed,) then provided  minguard assist Gait Pattern: Step-through pattern;Decreased stride length;Wide base of support    Exercises     PT Diagnosis: Abnormality of gait;Generalized weakness  PT Problem List: Decreased strength;Decreased balance;Decreased mobility;Decreased knowledge of use of DME;Decreased activity tolerance PT Treatment Interventions: DME instruction;Gait training;Functional mobility training;Therapeutic activities;Therapeutic exercise;Patient/family education;Balance training   PT Goals Acute Rehab PT Goals PT Goal Formulation: With patient Time For Goal Achievement: 09/04/12 Potential to Achieve Goals: Good Pt will go Stand to Sit: with modified independence PT Goal: Stand to Sit - Progress: Goal set today Pt will Stand: with modified independence;with unilateral upper extremity support;3 - 5 min PT Goal: Stand - Progress: Goal set today Pt will Ambulate: >150 feet;with modified independence;with least restrictive assistive device PT Goal: Ambulate - Progress: Goal set today Pt will Perform Home Exercise Program: with supervision, verbal cues required/provided PT Goal: Perform Home Exercise Program - Progress: Goal set today  Visit Information  Last PT Received On: 08/27/12    Subjective Data  Subjective: I am so upset at myself.  Didn't make it to the bathroom this morning in time and made a mess. Patient Stated Goal: To go home   Prior Functioning  Home Living Lives With: Family Available Help at Discharge: Family;Available 24 hours/day Type of Home: House Home Access: Level entry Home Layout: One level Home Adaptive Equipment: None Prior Function Level of Independence: Needs assistance Needs Assistance: Meal Prep;Light Housekeeping Meal Prep: Total Driving: No Communication Communication: No difficulties    Cognition  Cognition Overall Cognitive Status: Impaired Area of Impairment: Memory;Safety/judgement Arousal/Alertness: Awake/alert Orientation Level: Appears  intact for tasks assessed Behavior During Session: [facility] for tasks performed Safety/Judgement: Decreased awareness of need for assistance;Decreased  safety judgement for tasks assessed Safety/Judgement - Other Comments: sat in chair uncontrolled, was up in room transferring to [facility] independently    Extremity/Trunk Assessment Right Lower Extremity Assessment RLE ROM/Strength/Tone: Deficits RLE ROM/Strength/Tone Deficits: AROM WFL, strength hip flexion 4-/5, knee extension 4+/5, knee flexion 4-/5; noted edema, errythema and skin changes on lower aspect of right leg with pain to touch RLE Sensation: Deficits RLE Sensation Deficits: c/o numbness in feet and lower legs Left Lower Extremity Assessment LLE ROM/Strength/Tone: Deficits LLE ROM/Strength/Tone Deficits: AROM WFL, hip flexion 4-/5, knee extension 4+/5, flextion 4-/5 LLE Sensation: Deficits LLE Sensation Deficits: c/o numbness in feet and lower legs   Balance Balance Balance Assessed: Yes Static Standing Balance Static Standing - Balance Support: No upper extremity supported;Bilateral upper extremity supported Static Standing - Level of Assistance: 5: Stand by assistance Static Standing - Comment/# of Minutes: initially without UE support and supervision with anterior bias, no loss of balance, then with walker still supervision for safety, but decreased instability noted  End of Session PT - End of Session Activity Tolerance: Patient limited by pain Patient left: in chair;with call bell/phone within reach Nurse Communication: Patient requests pain meds  GP     [facility] 08/27/2012, 10:16 AM Sheran Lawless, PT [phone] 08/27/2012

## 2012-08-27 NOTE — Clinical Documentation Improvement (Signed)
CKD DOCUMENTATION CLARIFICATION QUERY   CLINICAL DOCUMENTATION QUERIES ARE NOT PART OF THE PERMANENT MEDICAL RECORD   Please update your documentation within the medical record to reflect your response to this query.                                                                                         08/27/12   Dr. Debby Bud and/or Associates,  In a better effort to capture your patient's severity of illness, reflect appropriate length of stay and utilization of resources, a review of the patient medical record has revealed the following indicators:  "Renal failure CKD III" documented in progress note 08/26/12 by Dr. Debby Bud.  Patient admitted with Heart Failure and being diuresed with IV Lasix.  Known history of Hypertension and Coronary Artery Dz.  Serial Chemistries daily to trend renal indices.  GFR (white female) 06/2010 through the current admission per [facility] review:  20 - 13  Creatinine has increased 0.42 mg/dl this admission in 48 hours     Based on your clinical judgment, please document in the progress notes and discharge summary if a condition below provides greater specificity regarding the patient's renal function:   - Acute Kidney Injury on Stage 4 Chronic Kidney Disease   - CKD Stage III - GFR 30-59   - CKD Stage IV - GFR 15-29   - CKD Stage V - GFR < 15   - Other Condition   - Unable to Clinically Determine    In responding to this query please exercise your independent judgment.    The fact that a query is asked, does not imply that any particular answer is desired or expected.   Reviewed: additional documentation in the medical record   Thank You,  Jerral Ralph  RN BSN CCDS Certified Clinical Documentation Specialist: Cell   [phone]  Health Information Management [place]  TO RESPOND TO THE THIS QUERY, FOLLOW THE INSTRUCTIONS BELOW:  1. If needed, update documentation for the patient's encounter via the notes activity.  2. Access  this query again and click edit on the In [employer].  3. After updating, or not, click F2 to complete all highlighted (required) fields concerning your review. Select "additional documentation in the medical record" OR "no additional documentation provided".  4. Click Sign note button.  5. The deficiency will fall out of your In Basket *Please let us know if you are not able to complete this workflow by phone or e-mail (listed below).

## 2012-08-28 DIAGNOSIS — J441 Chronic obstructive pulmonary disease with (acute) exacerbation: Secondary | ICD-10-CM

## 2012-08-28 DIAGNOSIS — I129 Hypertensive chronic kidney disease with stage 1 through stage 4 chronic kidney disease, or unspecified chronic kidney disease: Secondary | ICD-10-CM

## 2012-08-28 LAB — RENAL FUNCTION PANEL
Albumin: 3.1 g/dL — ABNORMAL LOW (ref 3.5–5.2)
BUN: 57 mg/dL — ABNORMAL HIGH (ref 6–23)
BUN: 55 mg/dL — ABNORMAL HIGH (ref 6–23)
CO2: 33 mEq/L — ABNORMAL HIGH (ref 19–32)
Calcium: 8 mg/dL — ABNORMAL LOW (ref 8.4–10.5)
Chloride: 101 mEq/L (ref 96–112)
Chloride: 100 mEq/L (ref 96–112)
Creatinine, Ser: 2.93 mg/dL — ABNORMAL HIGH (ref 0.50–1.10)
Creatinine, Ser: 2.92 mg/dL — ABNORMAL HIGH (ref 0.50–1.10)
GFR calc non Af Amer: 15 mL/min — ABNORMAL LOW (ref >90)
Phosphorus: 3.9 mg/dL (ref 2.3–4.6)
Potassium: 3.3 mEq/L — ABNORMAL LOW (ref 3.5–5.1)

## 2012-08-28 LAB — CBC WITH DIFFERENTIAL/PLATELET
Basophils Absolute: 0 10*3/uL (ref 0.0–0.1)
Basophils Relative: 0 % (ref 0–1)
Hemoglobin: 11.4 g/dL — ABNORMAL LOW (ref 12.0–15.0)
MCH: 25.4 pg — ABNORMAL LOW (ref 26.0–34.0)
MCV: 81.1 fL (ref 78.0–100.0)
Monocytes Absolute: 1 10*3/uL (ref 0.1–1.0)
Neutro Abs: 6 10*3/uL (ref 1.7–7.7)
RBC: 4.49 MIL/uL (ref 3.87–5.11)
RDW: 16.1 % — ABNORMAL HIGH (ref 11.5–15.5)
WBC: 8.6 10*3/uL (ref 4.0–10.5)

## 2012-08-28 MED ORDER — SPIRONOLACTONE 25 MG PO TABS
25.0000 mg | ORAL_TABLET | Freq: Every day | ORAL | Status: DC
  Administered 2012-08-28 – 2012-08-29 (×2): 25 mg via ORAL
  Filled 2012-08-28 (×2): qty 1

## 2012-08-28 MED ORDER — POTASSIUM CHLORIDE CRYS ER 20 MEQ PO TBCR
20.0000 meq | EXTENDED_RELEASE_TABLET | Freq: Two times a day (BID) | ORAL | Status: AC
  Administered 2012-08-28 (×2): 20 meq via ORAL
  Filled 2012-08-28 (×2): qty 1

## 2012-08-28 MED ORDER — PERMETHRIN 1 % EX LOTN
TOPICAL_LOTION | Freq: Once | CUTANEOUS | Status: AC
  Administered 2012-08-28: 15:00:00 via TOPICAL
  Filled 2012-08-28: qty 59

## 2012-08-28 MED ORDER — FUROSEMIDE 40 MG PO TABS
40.0000 mg | ORAL_TABLET | Freq: Two times a day (BID) | ORAL | Status: DC
  Administered 2012-08-28 – 2012-08-29 (×4): 40 mg via ORAL
  Filled 2012-08-28 (×7): qty 1

## 2012-08-28 MED ORDER — AMLODIPINE BESYLATE 10 MG PO TABS
10.0000 mg | ORAL_TABLET | Freq: Every day | ORAL | Status: DC
  Administered 2012-08-28 – 2012-09-01 (×5): 10 mg via ORAL
  Filled 2012-08-28 (×6): qty 1

## 2012-08-28 NOTE — Progress Notes (Signed)
1250 placed a call  To Infectious dept  And spoken with  staff. With directions given to give treatment as per MD  And be off isolation  In 24 hors if treatment is effective

## 2012-08-28 NOTE — Progress Notes (Signed)
08/28/12 1145 In to speak with pt. About Home Health services.  Pt. Is interested and has chosen [facility].  Pt. States she lives with her 3 grandsons, which are ages of 61, 80, 58, and she her great grandchildren live with her with their two mothers.  SHe does not want to go to SNF.  TC to Lupita Leash, with [facility] to give referral for [facility] RN and [facility] PT.  NCM will continue to follow Tera Mater, RN, BSN NCM [phone]

## 2012-08-28 NOTE — Progress Notes (Signed)
Patient refuses SNF placement and will return home with home health. She has 2 grandsons (adults) and 3 great grand children along with their mothers living in same home.   Physical Therapy is recommending home with Home Health; she has [facility]- Medicare complete which requires authorization and justification of SNF placement.  Patient would also have a $50.00 per day copay for the first 20 days and she is unable to afford this due to home bills.  RNCM has arranged for home health care. CSW will sign off.  Lorri Frederick. [place]  [phone]

## 2012-08-28 NOTE — Progress Notes (Signed)
Dr. Larina Bras king on call for Dr. Debby Bud

## 2012-08-28 NOTE — Progress Notes (Signed)
1230 Reported by physical therapist that pt has " lice "crawling in different places on her head . Seen and evaluated the pt . Noticed some  Tiny black insects looks   like lice . Pt denied itchiness  .Placed on  contact isolations . Placed a call to Dr. Alvera Novel service . Will notify Infectious  Dept

## 2012-08-28 NOTE — Progress Notes (Signed)
Physical Therapy Treatment Patient Details Name: TYNLEE BAYLE MRN: [id number] DOB: 09-Oct-1934 Today's Date: 08/28/2012 Time: 9562-1308 PT Time Calculation (min): 25 min  PT Assessment / Plan / Recommendation Comments on Treatment Session  Pt admitted with SOB and bil LE edema able to progress with mobility today. Pt states her 3 grandchildren ages 24-28 are living with her and can provide 24hr assist as they are all having trouble finding work. Pt encouraged to use RW at all times for safety and mobility. While treating pt noted small bugs moving in her hair and other nits throughout notified RN who also viewed bugs and stated she would contact MD. Encouraged continued mobility and HEP. Will follow.     Follow Up Recommendations  Home health PT;Supervision - Intermittent     Does the patient have the potential to tolerate intense rehabilitation     Barriers to Discharge        Equipment Recommendations  Rolling walker with 5" wheels    Recommendations for Other Services    Frequency     Plan Discharge plan remains appropriate;Frequency remains appropriate    Precautions / Restrictions Precautions Precautions: Fall   Pertinent Vitals/Pain No pain HR 74 with activity    Mobility  Bed Mobility Bed Mobility: Supine to Sit Supine to Sit: 6: Modified independent (Device/Increase time);HOB flat Transfers Sit to Stand: 6: Modified independent (Device/Increase time);From bed Stand to Sit: 5: Supervision;To chair/3-in-1;With armrests Details for Transfer Assistance: cueing for hand placement and control of descent as pt not controlling descent despite cueing Ambulation/Gait Ambulation/Gait Assistance: 5: Supervision Ambulation Distance (Feet): 280 Feet Assistive device: Rolling walker Ambulation/Gait Assistance Details: cuieng for posture and position in RW as well as directional cues Gait Pattern: Step-through pattern;Decreased stride length;Wide base of support Gait  velocity: decreased    Exercises General Exercises - Lower Extremity Long Arc Quad: AROM;Both;20 reps;Seated Hip ABduction/ADduction: AROM;Both;20 reps;Seated Hip Flexion/Marching: AROM;Both;20 reps;Seated Heel Raises: AROM;Both;20 reps;Seated   PT Diagnosis:    PT Problem List:   PT Treatment Interventions:     PT Goals Acute Rehab PT Goals PT Goal: Stand to Sit - Progress: Progressing toward goal PT Goal: Ambulate - Progress: Progressing toward goal PT Goal: Perform Home Exercise Program - Progress: Progressing toward goal  Visit Information  Last PT Received On: 08/28/12 Assistance Needed: +1    Subjective Data  Subjective: I'm so tired of people asking if I have help at home   Cognition  Cognition Arousal/Alertness: Awake/alert Orientation Level: Appears intact for tasks assessed Behavior During Session: [facility] for tasks performed Safety/Judgement: Decreased safety judgement for tasks assessed    Balance     End of Session PT - End of Session Equipment Utilized During Treatment: Gait belt Activity Tolerance: Patient tolerated treatment well Patient left: in chair;with call bell/phone within reach;with nursing in room Nurse Communication: Mobility status   GP     Delorse Lek 08/28/2012, 12:29 PM Delaney Meigs, PT [phone]

## 2012-08-28 NOTE — Progress Notes (Signed)
OT Cancellation Note  Patient Details Name: [name] MRN: [id number] DOB: 06-20-1934   Cancelled Treatment:    Reason Eval/Treat Not Completed: Medical issues which prohibited therapy;Other (comment) Nsg currently treating pt for lice. Will attempt to see in am. [facility] Edwing Figley, OTR/L  [phone] 08/28/2012 08/28/2012, 3:05 PM

## 2012-08-28 NOTE — Progress Notes (Signed)
1915 Dr. Pete Glatter called back , relayed   Pt's conversion to afib . Cardiac monitor re-checked  Pt is now SR to ST 80' 120 . No orders given . Instructed to call Dr . Charlestine Massed , cardiologist  . . Reported to edna , Charity fundraiser

## 2012-08-28 NOTE — Progress Notes (Signed)
Subjective:  Patient states she feels better, is breathing better.  No chest pain. Negative I/O again yesterday.  Weight today pending.  Objective:  Vital Signs in the last 24 hours: Temp:  [97.8 F (36.6 C)-97.9 F (36.6 C)] 97.8 F (36.6 C) (03/13 2058) Pulse Rate:  [58-68] 58 (03/13 2058) Resp:  [18-19] 18 (03/13 2058) BP: (145-156)/(56-67) 150/57 mmHg (03/13 2058) SpO2:  [91 %-94 %] 91 % (03/13 2058)  Intake/Output from previous day: 03/13 0701 - 03/14 0700 In: 920 [P.O.:920] Out: 3200 [Urine:3200] Intake/Output from this shift:    . amLODipine  5 mg Oral Daily  . aspirin  81 mg Oral Daily  . atorvastatin  20 mg Oral q1800  . carvedilol  6.25 mg Oral BID WC  . enoxaparin (LOVENOX) injection  30 mg Subcutaneous Q24H  . furosemide  40 mg Intravenous Q12H  . isosorbide mononitrate  30 mg Oral Daily  . [START ON 08/29/2012] levofloxacin  500 mg Oral Q48H  . sodium chloride  3 mL Intravenous Q12H  . sodium chloride  3 mL Intravenous Q12H      Physical Exam: The patient appears to be in no distress. Head and neck exam reveals that the pupils are equal and reactive. The extraocular movements are full. There is no scleral icterus. Mouth and pharynx are benign. No lymphadenopathy. No carotid bruits. The jugular venous pressure is normal. Thyroid is not enlarged or tender.  Chest reveals distant breath sounds. Mild expiratory wheezing.  Heart reveals no abnormal lift or heave. First and second heart sounds are normal. There is no murmur gallop rub or click.  The abdomen is soft and nontender. Bowel sounds are normoactive. There is no hepatosplenomegaly or mass. There are no abdominal bruits.  Extremities chronic pedal edema. Evidence of prior discoloration and scarring of the right lower extremity. Pedal pulses are equivocal  Neurologic exam is normal strength and no lateralizing weakness. No sensory deficits.  Integument reveals no rash   Lab Results:  Recent Labs  08/25/12 1742 08/26/12 0630  WBC 7.8 9.7  HGB 10.8* 10.3*  PLT 309 260    Recent Labs  08/27/12 0539 08/28/12 0540  NA 141 142  K 3.5 3.2*  CL 104 101  CO2 26 30  GLUCOSE 86 92  BUN 59* 57*  CREATININE 3.14* 2.93*    Recent Labs  08/26/12 0046 08/26/12 0630  TROPONINI <0.30 <0.30   Hepatic Function Panel  Recent Labs  08/28/12 0540  ALBUMIN 3.1*    Recent Labs  08/27/12 0539  CHOL 115   No results found for this basename: PROTIME,  in the last 72 hours  Imaging: Imaging results have been reviewed  Cardiac Studies: Telemetry shows NSR. Assessment/Plan:  1. ischemic heart disease history of prior coronary artery stents. Recent chest pain with negative cardiac enzymes and EKG which is nonacute. She is not a candidate for cardiac catheterization because of her renal insufficiency. We will plan to maximize her medical therapy including statins, beta blockers, aspirin, and nitrates. Tobacco cessation advised.  2. congestive heart failure secondary to mixed systolic and diastolic acute on chronic CHF ejection fraction 40-45%. Plan continue efforts to diurese watch renal function closely.   3. COPD with ongoing cigarette abuse.  4. chronic renal insufficiency.  Creatinine improved this am 2.93   5. hypertensive cardiovascular disease   Plan: continue diuresis. Consider switch to oral lasix.    LOS: 3 days    Cassell Clement 08/28/2012, 7:31 AM

## 2012-08-28 NOTE — Progress Notes (Signed)
1255 calle and spoken with Dr. Debby Bud with orders

## 2012-08-28 NOTE — Progress Notes (Addendum)
Subjective: [name] is sitting up in a chair. She is a little depressed about her loss of independence. She does report that she had exertional chest pain when up with PT yesterday. Has not had any chest pain at rest.   Objective: Lab: Lab Results  Component Value Date   WBC 9.7 08/26/2012   HGB 10.3* 08/26/2012   HCT 32.3* 08/26/2012   MCV 79.8 08/26/2012   PLT 260 08/26/2012   BMET    Component Value Date/Time   NA 142 08/28/2012 0540   K 3.2* 08/28/2012 0540   CL 101 08/28/2012 0540   CO2 30 08/28/2012 0540   GLUCOSE 92 08/28/2012 0540   BUN 57* 08/28/2012 0540   CREATININE 2.93* 08/28/2012 0540   CALCIUM 8.0* 08/28/2012 0540   GFRNONAA 15* 08/28/2012 0540   GFRAA 17* 08/28/2012 0540   pBNP 5,986 ( 11398)  Imaging:  Scheduled Meds: . amLODipine  5 mg Oral Daily  . aspirin  81 mg Oral Daily  . atorvastatin  20 mg Oral q1800  . carvedilol  6.25 mg Oral BID WC  . enoxaparin (LOVENOX) injection  30 mg Subcutaneous Q24H  . furosemide  40 mg Intravenous Q12H  . isosorbide mononitrate  30 mg Oral Daily  . [START ON 08/29/2012] levofloxacin  500 mg Oral Q48H  . sodium chloride  3 mL Intravenous Q12H  . sodium chloride  3 mL Intravenous Q12H   Continuous Infusions:  PRN Meds:.sodium chloride, acetaminophen, acetaminophen, levalbuterol, ondansetron (ZOFRAN) IV, ondansetron, sodium chloride   Physical Exam: Filed Vitals:   08/28/12 0530  BP: 197/72  Pulse: 68  Temp: 97.5 F (36.4 C)  Resp: 18    Intake/Output Summary (Last 24 hours) at 08/28/12 0902 Last data filed at 08/28/12 0818  Gross per 24 hour  Intake   1160 ml  Output   2800 ml  Net  -1640 ml  adm total: -4 liters  Gen'l - obese whitge woman sitting in a chair HEENT- C&S clear Cor - 2+ radial pulse, RRR PUlm - decreased BS at the bases, no rales appreciated. Expiratory wheezes noted throughout. No increased WOB, no use of accessory muscles in use Ext - 2+ edema, erythema distal right lower extremity with  mild warmth Neuro - A&O x 3. No focal changes     Assessment/Plan: 1. Cardiac - good diuresis of 4 liters to date. She does report exertional chest pain despite being on long acting nitrate. NOt a cath candidate at this time but if her chest pain gets worse may need to revisit this issue.  Plan Change to po furosemide 40 mg bid  Add aldactone 25 mg qd.  2. HTN - BP has taken a rise. She has not been restarted pm RAS drugs due to concerns about renal function.  Plan Increase Amlodipine to 10 mg daily  Add aldactone  Consider restarting ACE/ARB if renal function remains stable.   3. CKD III - patient with acute further renal decompensation to class IV but today her creatinine is improved.   PLan Continue to diurese  Follow Renal panel.  4. ID - patient was started on Levaquin at admission - PNA vs cellulitis. NO evidence of PNA but her right distal LE does appear inflammed.  Plan  Continue PO levaquin  CBCD to monitor infection, i.e. Cellulitis as well as chronic anemia  5. PUlmonary - patient has been nebs. She has persistent wheezing on exam: cardiac vs pulmonary.  Plan Continue nebs.   6. Dispo -  PT note reviewed. However, patient lives alone and continues to be limited in activity.  Plan - ST-SNF   [facility] (o) [phone]; (c) [phone] Call-grp - Patsi Sears IM  Tele: [phone]  08/28/2012, 9:02 AM

## 2012-08-28 NOTE — Progress Notes (Signed)
Patient evaluated for community based chronic disease management services with [facility] Care Management Program as a benefit of patient's [facility] Medicare Insurance.  Spoke with patient at bedside to explain [facility] Care Management services. Patient has declined services at this time.  She indicated that she has been contacted by a Administrator prior to admission.  She would like to use her [employer] Nurse for follow up at home.  Explained to patient that [facility] offers services that would work with Susa Simmonds and not interfere with their services.  Patient maintained that she felt she could manage with services.  Patient indicated that she does not have a scale at home, does not weigh daily,  and can not always get to her appointments because of the lack of gas money.  She did not reach out to Dr Ranell Patrick for assistance with her increasing BLE edema because she felt that she did not have the money for office visits.  Left contact information and [facility] literature with patient.  Notified [facility] Liaison and Lovette Cliche LCSW of assessment. [employer] Liaison indicated that she is not active with a [facility] health management program or home health care.  Notified Lovette Cliche LCSW of [employer] status.  Of note, [facility] Care Management services does not replace or interfere with any services that are arranged by inpatient case management or social work.  For additional questions or referrals please contact Anibal Henderson BSN RN [facility] Liaison at [phone].

## 2012-08-28 NOTE — Progress Notes (Signed)
1830 cardiac monitor review called . In by CMT  Pt is Afib  120's Ekg done  Placed a call to Dr. Alvera Novel service

## 2012-08-29 ENCOUNTER — Encounter (HOSPITAL_COMMUNITY): Payer: Self-pay | Admitting: Internal Medicine

## 2012-08-29 DIAGNOSIS — E876 Hypokalemia: Secondary | ICD-10-CM

## 2012-08-29 DIAGNOSIS — B851 Pediculosis due to Pediculus humanus corporis: Secondary | ICD-10-CM

## 2012-08-29 DIAGNOSIS — N184 Chronic kidney disease, stage 4 (severe): Secondary | ICD-10-CM

## 2012-08-29 HISTORY — DX: Chronic kidney disease, stage 4 (severe): N18.4

## 2012-08-29 LAB — GLUCOSE, CAPILLARY: Glucose-Capillary: 123 mg/dL — ABNORMAL HIGH (ref 70–99)

## 2012-08-29 MED ORDER — ISOSORBIDE MONONITRATE ER 60 MG PO TB24
60.0000 mg | ORAL_TABLET | Freq: Every day | ORAL | Status: DC
  Filled 2012-08-29: qty 1

## 2012-08-29 MED ORDER — BUDESONIDE-FORMOTEROL FUMARATE 80-4.5 MCG/ACT IN AERO
2.0000 | INHALATION_SPRAY | Freq: Two times a day (BID) | RESPIRATORY_TRACT | Status: DC
  Administered 2012-08-29 – 2012-09-02 (×7): 2 via RESPIRATORY_TRACT
  Filled 2012-08-29: qty 6.9

## 2012-08-29 MED ORDER — PERMETHRIN 1 % EX LOTN
TOPICAL_LOTION | Freq: Once | CUTANEOUS | Status: AC
  Administered 2012-08-29: 10:00:00 via TOPICAL
  Filled 2012-08-29: qty 59

## 2012-08-29 NOTE — Progress Notes (Signed)
Subjective: Very somnolent this morning. She does report continued exertional chest pain. Also- lice problem noted. Per RN she also was found to have body lice. She reports her breathing is better.  She reports that her grandsons do look after her some, and her son, who lives nearby, does check on her. She does not want to go to SNF. [facility] note reviewed. Reassured patient that we do accept Medicare at the office.   Objective: Lab: Lab Results  Component Value Date   WBC 8.6 08/28/2012   HGB 11.4* 08/28/2012   HCT 36.4 08/28/2012   MCV 81.1 08/28/2012   PLT 243 08/28/2012   BMET    Component Value Date/Time   NA 142 08/28/2012 0955   K 3.3* 08/28/2012 0955   CL 100 08/28/2012 0955   CO2 33* 08/28/2012 0955   GLUCOSE 101* 08/28/2012 0955   BUN 55* 08/28/2012 0955   CREATININE 2.92* 08/28/2012 0955   CALCIUM 7.9* 08/28/2012 0955   GFRNONAA 15* 08/28/2012 0955   GFRAA 17* 08/28/2012 0955     Imaging:  Scheduled Meds: . amLODipine  10 mg Oral Daily  . aspirin  81 mg Oral Daily  . atorvastatin  20 mg Oral q1800  . carvedilol  6.25 mg Oral BID WC  . enoxaparin (LOVENOX) injection  30 mg Subcutaneous Q24H  . furosemide  40 mg Oral BID  . isosorbide mononitrate  30 mg Oral Daily  . levofloxacin  500 mg Oral Q48H  . sodium chloride  3 mL Intravenous Q12H  . sodium chloride  3 mL Intravenous Q12H  . spironolactone  25 mg Oral Daily   Continuous Infusions:  PRN Meds:.sodium chloride, acetaminophen, acetaminophen, levalbuterol, ondansetron (ZOFRAN) IV, ondansetron, sodium chloride   Physical Exam: Filed Vitals:   08/29/12 0738  BP: 172/86  Pulse: 74  Temp: 98 F (36.7 C)  Resp: 20    Intake/Output Summary (Last 24 hours) at 08/29/12 0934 Last data filed at 08/29/12 0800  Gross per 24 hour  Intake   1012 ml  Output   2350 ml  Net  -1338 ml  total for adm: -5.4 liters Wt 222 - 206 for net loss of 16 lbs  Gen'l - obese white female Cor - 2+ radial, RRR PUlm - no increased WOB,  diffuse mild expiratory wheezing, Better breath sounds in general, no wheezings Ext - persistent edema. Derm - distal erythema right LE without calore. Small excoriations on distal legs (in hindsight may be pediculosis changes) Neuro - somnolent     Assessment/Plan: 1. Cardiac - diuresis of 5.4 liters. Breathing is better. She is now on oral meds. Remain concerned about exertional c/p.  Plan Continue present mediations.  Will order adenosine myoview to better risk stratify her if cardiology agrees  Will consider Ranexa if angina persists with exertion and she is cleared for acute obstructive disease.  2. HTN-  BP remains elevated despite increase in amlodipine and addition of aldactone.  PLan If renal function is stable will restart ACE-I  3. CKD III - with exacerbation.  Plan Renal panel  4. ID -  Day # 5/7 Levaquin. Also with pediculosis, s/p shampoo treatment.  Plan bodywash treatment  5. Pulmonary - stable sats, minor wheezing   PLan Start symbicort 80/45 2 inhalations bid.  6. Dispo - notes reviewed, patient's wishes considered - will plan for [facility] services at discharge.    Illene Regulus Taylor IM (o) [phone]; (c) [phone] Call-grp - Patsi Sears IM  Tele: [phone]  08/29/2012,  9:31 AM

## 2012-08-29 NOTE — Progress Notes (Signed)
Patient Name: [name]      SUBJECTIVE:  admitted with recurrent chest pain in the setting of ischemic heart disease and prior stenting. Cardiac enzymes have been negative. Is not felt to be a candidate for catheterization secondary to renal insufficiency. His also had problems with congestive heart failure secondary to mixed systolic and diastolic dysfunction with an EF of 40-45%.\\  She continues to have chest discomfort as he walks around the room. She has chronic shortness of breath.   SHe continues to smoke  Past Medical History  Diagnosis Date  . Anginal pain   . Myocardial infarction   . Hypertension   . CHF (congestive heart failure)   . Anemia   . Stroke   . Pneumonia   . Shortness of breath   . GERD (gastroesophageal reflux disease)   . Headache   . Cancer   . Arthritis   . Depression   . Renal dysfunction- Gd 4 08/29/2012    PHYSICAL EXAM Filed Vitals:   08/28/12 1306 08/28/12 1852 08/28/12 2038 08/29/12 0738  BP: 145/60 121/82 157/64 172/86  Pulse: 71 121 68 74  Temp: 97.1 F (36.2 C)  97.8 F (36.6 C) 98 F (36.7 C)  TempSrc: Oral  Oral Oral  Resp: 20 18 18 20   Height:      Weight:    206 lb 6.4 oz (93.622 kg)  SpO2: 94% 94% 93% 95%    Well developed and nourished in no acute distress HENT normal Neck supple with JVP-flat Clear Regular rate and rhythm, no murmurs or gallops Abd-soft with active BS No Clubbing cyanosis edema Skin-warm and dry A & Oriented  Grossly normal sensory and motor function     Intake/Output Summary (Last 24 hours) at 08/29/12 0946 Last data filed at 08/29/12 0800  Gross per 24 hour  Intake   1012 ml  Output   2350 ml  Net  -1338 ml    LABS: Basic Metabolic Panel:  Recent Labs Lab 08/25/12 0443 08/25/12 1742 08/26/12 0630 08/26/12 1629 08/27/12 0539 08/28/12 0540 08/28/12 0955  NA 141  --  142 137 141 142 142  K 4.6  --  3.9 4.1 3.5 3.2* 3.3*  CL 107  --  106 102 104 101 100  CO2 25  --  23  26 26 30  33*  GLUCOSE 146*  --  94 95 86 92 101*  BUN 52*  --  56* 57* 59* 57* 55*  CREATININE 2.72* 2.70* 3.03* 3.03* 3.14* 2.93* 2.92*  CALCIUM 8.4  --  7.9* 7.6* 7.7* 8.0* 7.9*  PHOS  --   --   --   --   --  3.9 3.5   Cardiac Enzymes: No results found for this basename: CKTOTAL, CKMB, CKMBINDEX, TROPONINI,  in the last 72 hours CBC:  Recent Labs Lab 08/25/12 0443 08/25/12 1742 08/26/12 0630 08/28/12 0955  WBC 9.8 7.8 9.7 8.6  NEUTROABS 7.6  --   --  6.0  HGB 11.4* 10.8* 10.3* 11.4*  HCT 36.5 34.8* 32.3* 36.4  MCV 83.3 82.9 79.8 81.1  PLT 264 309 260 243   PROTIME: No results found for this basename: LABPROT, INR,  in the last 72 hours Liver Function Tests:  Recent Labs  08/28/12 0540 08/28/12 0955  ALBUMIN 3.1* 3.1*   No results found for this basename: LIPASE, AMYLASE,  in the last 72 hours BNP: BNP (last 3 results)  Recent Labs  08/25/12 0443 08/26/12 0630 08/28/12 0540  PROBNP 11398.0* 11131.0* 5958.0*   D-Dimer: No results found for this basename: DDIMER,  in the last 72 hours Hemoglobin A1C: No results found for this basename: HGBA1C,  in the last 72 hours Fasting Lipid Panel:  Recent Labs  08/27/12 0539  CHOL 115  HDL 54  LDLCALC 36  TRIG 126  CHOLHDL 2.1       ASSESSMENT AND PLAN: Active Problems:   HYPERTENSION, BENIGN ESSENTIAL, LABILE   CAD   Acute exacerbation of CHF (congestive heart failure)   Renal dysfunction- Gd 4   Hypokalemia    I am going to hold aldactone as CHF guidelines note that even in [place] pts Cr CL<30 were excluded With ongoing chest discomfort, we'll increase her nitroglycerin. Enzymes are negative so will hold on Plavix. I discussed this with Dr. Luberta Mutter undertake Ronnald Collum Myoview in the morning    Signed, Sherryl Manges MD  08/29/2012

## 2012-08-30 ENCOUNTER — Inpatient Hospital Stay (HOSPITAL_COMMUNITY): Payer: Medicare Other

## 2012-08-30 DIAGNOSIS — R079 Chest pain, unspecified: Secondary | ICD-10-CM

## 2012-08-30 LAB — RENAL FUNCTION PANEL
BUN: 55 mg/dL — ABNORMAL HIGH (ref 6–23)
Glucose, Bld: 96 mg/dL (ref 70–99)
Phosphorus: 3.1 mg/dL (ref 2.3–4.6)
Potassium: 3.9 mEq/L (ref 3.5–5.1)

## 2012-08-30 LAB — GLUCOSE, CAPILLARY: Glucose-Capillary: 104 mg/dL — ABNORMAL HIGH (ref 70–99)

## 2012-08-30 MED ORDER — REGADENOSON 0.4 MG/5ML IV SOLN
0.4000 mg | Freq: Once | INTRAVENOUS | Status: AC
  Administered 2012-08-30: 0.4 mg via INTRAVENOUS
  Filled 2012-08-30: qty 5

## 2012-08-30 MED ORDER — TECHNETIUM TC 99M SESTAMIBI - CARDIOLITE
10.0000 | Freq: Once | INTRAVENOUS | Status: AC | PRN
  Administered 2012-08-30: 10 via INTRAVENOUS

## 2012-08-30 MED ORDER — FUROSEMIDE 10 MG/ML IJ SOLN
40.0000 mg | Freq: Two times a day (BID) | INTRAMUSCULAR | Status: AC
  Administered 2012-08-30 (×2): 40 mg via INTRAVENOUS
  Filled 2012-08-30 (×2): qty 4

## 2012-08-30 MED ORDER — TECHNETIUM TC 99M SESTAMIBI - CARDIOLITE
30.0000 | Freq: Once | INTRAVENOUS | Status: AC | PRN
  Administered 2012-08-30: 30 via INTRAVENOUS

## 2012-08-30 MED ORDER — ISOSORBIDE MONONITRATE ER 60 MG PO TB24
90.0000 mg | ORAL_TABLET | Freq: Every day | ORAL | Status: DC
  Administered 2012-08-30 – 2012-09-01 (×3): 90 mg via ORAL
  Filled 2012-08-30 (×4): qty 1

## 2012-08-30 NOTE — Progress Notes (Signed)
Patient Name: [name]      SUBJECTIVE:  admitted with recurrent chest pain in the setting of ischemic heart disease and prior stenting. Cardiac enzymes have been negative. Is not felt to be a candidate for catheterization secondary to renal insufficiency. She has also had problems with congestive heart failure secondary to mixed systolic and diastolic dysfunction with an EF of 40-45%.  She has has no more chest discomfort as she walks around the room. She has chronic shortness of breath. Edema is still a little problematic      Past Medical History  Diagnosis Date  . Anginal pain   . Myocardial infarction   . Hypertension   . CHF (congestive heart failure)   . Anemia   . Stroke   . Pneumonia   . Shortness of breath   . GERD (gastroesophageal reflux disease)   . Headache   . Cancer   . Arthritis   . Depression   . Renal dysfunction- Gd 4 08/29/2012    PHYSICAL EXAM Filed Vitals:   08/29/12 1550 08/29/12 2045 08/29/12 2148 08/30/12 0645  BP: 173/69  163/50 162/57  Pulse: 70 72 63 71  Temp: 97.7 F (36.5 C)  97.8 F (36.6 C) 97.9 F (36.6 C)  TempSrc: Oral  Oral Oral  Resp: 20 18 20 18   Height:      Weight:    214 lb 9.6 oz (97.342 kg)  SpO2: 95%  95% 96%    Well developed and nourished in no acute distress HENT normal Neck supple with JVP-flat Clear Regular rate and rhythm, no murmurs or gallops Abd-soft with active BS No Clubbing cyanosis edema Skin-warm and dry A & Oriented  Grossly normal sensory and motor function     Intake/Output Summary (Last 24 hours) at 08/30/12 0844 Last data filed at 08/30/12 1610  Gross per 24 hour  Intake   1772 ml  Output   1400 ml  Net    372 ml    LABS: Basic Metabolic Panel:  Recent Labs Lab 08/25/12 0443 08/25/12 1742 08/26/12 0630 08/26/12 1629 08/27/12 0539  08/28/12 0540 08/28/12 0955 08/30/12 0720  NA 141  --  142 137 141  --  142 142 139  K 4.6  --  3.9 4.1 3.5  --  3.2* 3.3* 3.9  CL 107   --  106 102 104  --  101 100 100  CO2 25  --  23 26 26   --  30 33* 28  GLUCOSE 146*  --  94 95 86  --  92 101* 96  BUN 52*  --  56* 57* 59*  --  57* 55* 55*  CREATININE 2.72* 2.70* 3.03* 3.03* 3.14*  --  2.93* 2.92* 2.61*  CALCIUM 8.4  --  7.9* 7.6* 7.7*  --  8.0* 7.9* 8.0*  PHOS  --   --   --   --   --   < > 3.9 3.5 3.1  < > = values in this interval not displayed. Cardiac Enzymes: No results found for this basename: CKTOTAL, CKMB, CKMBINDEX, TROPONINI,  in the last 72 hours CBC:  Recent Labs Lab 08/25/12 0443 08/25/12 1742 08/26/12 0630 08/28/12 0955  WBC 9.8 7.8 9.7 8.6  NEUTROABS 7.6  --   --  6.0  HGB 11.4* 10.8* 10.3* 11.4*  HCT 36.5 34.8* 32.3* 36.4  MCV 83.3 82.9 79.8 81.1  PLT 264 309 260 243   PROTIME: No results found for this basename:  LABPROT, INR,  in the last 72 hours Liver Function Tests:  Recent Labs  08/28/12 0955 08/30/12 0720  ALBUMIN 3.1* 2.8*   No results found for this basename: LIPASE, AMYLASE,  in the last 72 hours BNP: BNP (last 3 results)  Recent Labs  08/25/12 0443 08/26/12 0630 08/28/12 0540  PROBNP 11398.0* 11131.0* 5958.0*   D-Dimer: No results found for this basename: DDIMER,  in the last 72 hours Hemoglobin A1C: No results found for this basename: HGBA1C,  in the last 72 hours Fasting Lipid Panel: No results found for this basename: CHOL, HDL, LDLCALC, TRIG, CHOLHDL, LDLDIRECT,  in the last 72 hours     ASSESSMENT AND PLAN: Active Problems:   HYPERTENSION, BENIGN ESSENTIAL, LABILE   CAD   Acute exacerbation of CHF (congestive heart failure)   Renal dysfunction- Gd 4   Hypokalemia     Improved with less CP  myoview pending Continue diuresis>> will give IV x 2 doses Await myoview result Increase imdur with BP to spare     Signed, Sherryl Manges MD  08/30/2012

## 2012-08-30 NOTE — Progress Notes (Addendum)
Patient had 19 beats of VTACH.  Dr. Corbin Ade made aware. Patient asymptomatic. RN will continue to monitor. Louretta Parma, RN

## 2012-08-30 NOTE — Progress Notes (Signed)
Subjective: Appreciate Dr. Odessa Fleming help. IV lasix doses noted. Myoview in  process, results pending, patient not back to room  Objective: Lab: Lab Results  Component Value Date   WBC 8.6 08/28/2012   HGB 11.4* 08/28/2012   HCT 36.4 08/28/2012   MCV 81.1 08/28/2012   PLT 243 08/28/2012   BMET    Component Value Date/Time   NA 139 08/30/2012 0720   K 3.9 08/30/2012 0720   CL 100 08/30/2012 0720   CO2 28 08/30/2012 0720   GLUCOSE 96 08/30/2012 0720   BUN 55* 08/30/2012 0720   CREATININE 2.61* 08/30/2012 0720   CALCIUM 8.0* 08/30/2012 0720   GFRNONAA 17* 08/30/2012 0720   GFRAA 20* 08/30/2012 0720     Imaging:  Scheduled Meds: . amLODipine  10 mg Oral Daily  . aspirin  81 mg Oral Daily  . atorvastatin  20 mg Oral q1800  . budesonide-formoterol  2 puff Inhalation BID  . carvedilol  6.25 mg Oral BID WC  . enoxaparin (LOVENOX) injection  30 mg Subcutaneous Q24H  . furosemide  40 mg Intravenous BID  . isosorbide mononitrate  90 mg Oral Daily  . levofloxacin  500 mg Oral Q48H  . sodium chloride  3 mL Intravenous Q12H  . sodium chloride  3 mL Intravenous Q12H   Continuous Infusions:  PRN Meds:.sodium chloride, acetaminophen, acetaminophen, levalbuterol, ondansetron (ZOFRAN) IV, ondansetron, sodium chloride   Physical Exam: Filed Vitals:   08/30/12 1026  BP: 164/55  Pulse: 67  Temp:   Resp:   no exam      Assessment/Plan: .1. Cardiac - continues to diurese  2 -3 ) stable on chart review.   Illene Regulus Fleetwood IM (o) [phone]; (c) [phone] Call-grp - Patsi Sears IM  Tele: [phone]  08/30/2012, 11:15 AM

## 2012-08-31 LAB — GLUCOSE, CAPILLARY: Glucose-Capillary: 104 mg/dL — ABNORMAL HIGH (ref 70–99)

## 2012-08-31 LAB — BASIC METABOLIC PANEL
CO2: 31 mEq/L (ref 19–32)
Calcium: 7.9 mg/dL — ABNORMAL LOW (ref 8.4–10.5)
GFR calc Af Amer: 18 mL/min — ABNORMAL LOW (ref >90)
GFR calc non Af Amer: 16 mL/min — ABNORMAL LOW (ref >90)
Sodium: 141 mEq/L (ref 135–145)

## 2012-08-31 LAB — MAGNESIUM: Magnesium: 2.2 mg/dL (ref 1.5–2.5)

## 2012-08-31 MED ORDER — FUROSEMIDE 40 MG PO TABS
40.0000 mg | ORAL_TABLET | Freq: Two times a day (BID) | ORAL | Status: DC
  Administered 2012-08-31 – 2012-09-02 (×4): 40 mg via ORAL
  Filled 2012-08-31 (×7): qty 1

## 2012-08-31 MED ORDER — PERMETHRIN 1 % EX LOTN
TOPICAL_LOTION | Freq: Once | CUTANEOUS | Status: AC
  Administered 2012-08-31: 17:00:00 via TOPICAL
  Filled 2012-08-31: qty 59

## 2012-08-31 NOTE — Plan of Care (Signed)
Problem: Problem: Diabetes Management Progression Goal: INCREASE DIABETES KNOWLEDGE Outcome: Completed/Met Date Met:  08/31/12 Pt and family given handouts for Diabetes Meal Planning guide and 1800 cal ADA diet and discussed portion sizes. Pt says once in awhile she drinks sweet tea. Talked to family about alternatives to sugar such as Splenda .

## 2012-08-31 NOTE — Progress Notes (Signed)
Noted pt still has live Lice crawling on her head. Call to Dr Debby Bud office and received another order for medication [place] solution 1% . Washed pt's hair as directed and toweled dried. Applied solution and left on scalp for a good 10 min. Rinsed pt's hair well over in  sink. Noted many ( hundreds) of lice some extremely small on pt's scalp. Pt has long hair hard to get nit comb through. Larger comb used first than smaller nit comb. Spent over 1 hr with pt for total treatment.

## 2012-08-31 NOTE — Plan of Care (Signed)
Problem: Phase I Progression Outcomes Goal: EF % per last Echo/documented,Core Reminder form on chart Outcome: Completed/Met Date Met:  08/31/12 Echo done 08/25/2012 showed EF 40-45% Louretta Parma, RN

## 2012-08-31 NOTE — Care Management Note (Signed)
    Page 1 of 2   08/31/2012     1:26:57 PM   CARE MANAGEMENT NOTE 08/31/2012  Patient:  [name], [name]   Account Number:  [id number]  Date Initiated:  08/31/2012  Documentation initiated by:  GRAVES-BIGELOW,Baljit Liebert  Subjective/Objective Assessment:   Pt admitted with chf exacerbation. Pt lives with two grandsons and then the wife and grandchildren.     Action/Plan:   Pt wants to go home and CM made referral to [facility] for [facility], PT, OT and SW. CM will make referral for services. SOC to begin within 24-48 hours post d/c.   Anticipated DC Date:  09/01/2012   Anticipated DC Plan:  HOME W HOME HEALTH SERVICES      DC Planning Services  CM consult      [facility] Choice  HOME HEALTH   Choice offered to / List presented to:  C-1 Patient        HH arranged  HH-1 RN  HH-10 DISEASE MANAGEMENT  HH-2 PT  HH-3 OT  HH-6 SOCIAL WORKER      HH agency  [facility]   Status of service:  Completed, signed off Medicare Important Message given?   (If response is "NO", the following Medicare IM given date fields will be blank) Date Medicare IM given:   Date Additional Medicare IM given:    Discharge Disposition:  HOME W HOME HEALTH SERVICES  Per UR Regulation:  Reviewed for med. necessity/level of care/duration of stay  If discussed at Long Length of Stay Meetings, dates discussed:    Comments:  08-31-12 1325 Berneice Heinrich, RN,BSN [phone] CM will continue to f/u for any additional needs.

## 2012-08-31 NOTE — Progress Notes (Signed)
Subjective: No new complaints. Reports she had some c/p during myoview study. None since yestwerday.  Objective: Lab: Lab Results  Component Value Date   WBC 8.6 08/28/2012   HGB 11.4* 08/28/2012   HCT 36.4 08/28/2012   MCV 81.1 08/28/2012   PLT 243 08/28/2012   BMET    Component Value Date/Time   NA 139 08/30/2012 0720   K 3.9 08/30/2012 0720   CL 100 08/30/2012 0720   CO2 28 08/30/2012 0720   GLUCOSE 96 08/30/2012 0720   BUN 55* 08/30/2012 0720   CREATININE 2.61* 08/30/2012 0720   CALCIUM 8.0* 08/30/2012 0720   GFRNONAA 17* 08/30/2012 0720   GFRAA 20* 08/30/2012 0720     Imaging: Myoview: MPRESSION:  1. No evidence of pharmacologic induced myocardial ischemia.  2. Large myocardial scar involving the inferior and apical walls.  3. Global hypokinesis with greatest involvement of the inferior  and septal walls.  4. Calculated left hip ejection fraction is 35%.  Scheduled Meds: . amLODipine  10 mg Oral Daily  . aspirin  81 mg Oral Daily  . atorvastatin  20 mg Oral q1800  . budesonide-formoterol  2 puff Inhalation BID  . carvedilol  6.25 mg Oral BID WC  . enoxaparin (LOVENOX) injection  30 mg Subcutaneous Q24H  . isosorbide mononitrate  90 mg Oral Daily  . levofloxacin  500 mg Oral Q48H  . sodium chloride  3 mL Intravenous Q12H  . sodium chloride  3 mL Intravenous Q12H   Continuous Infusions:  PRN Meds:.sodium chloride, acetaminophen, acetaminophen, levalbuterol, ondansetron (ZOFRAN) IV, ondansetron, sodium chloride   Physical Exam: Filed Vitals:   08/31/12 0537  BP: 153/77  Pulse: 80  Temp: 97.6 F (36.4 C)  Resp: 18    Intake/Output Summary (Last 24 hours) at 08/31/12 0626 Last data filed at 08/31/12 0600  Gross per 24 hour  Intake   1090 ml  Output   2575 ml  Net  -1485 ml   Adm total: 5, 372 ml  obese elderly white woman in no distress Cor - 2+ radial, RRR Pulm - good breathsounds Abd - soft Neuro - stable: A&O x 3      Assessment/Plan: 1. Cardiac  - improved. Minimal additional diuresis. Myview without evidence of active ischemia, EF 35%, large scar with hypokinesis.  Plan Continue po Lasix.  Continue other cardiac meds.  2. HTN - better but remains elevated. Hesitate to restart ACE until Creatinine comes down closer to baseline of 2.2  3. CKD III-   Plan Renal profile this AM  4. ID - day 7/7 levaquin. Has had treatment for pediculosis  5. Pulmonary - improved, no wheezing appreciated this AM  Plan Continue symbicort.   Dispo - home in AM with [facility] RN, PT   [employer] IM (o) [phone]; (c) [phone] Call-grp - Patsi Sears IM  Tele: [phone]  08/31/2012, 6:19 AM

## 2012-08-31 NOTE — Evaluation (Signed)
Occupational Therapy Evaluation and Discharge Patient Details Name: [name] MRN: [id number] DOB: Oct 23, 1934 Today's Date: 08/31/2012 Time: 4098-1191 OT Time Calculation (min): 26 min  OT Assessment / Plan / Recommendation Clinical Impression  This 78 yo female admitted with CHF exacerbation presents to acute OT with problems below. Will benefit from [facility], acute OT will be D/C'd.    OT Assessment  All further OT needs can be met in the next venue of care    Follow Up Recommendations  Home health OT    Barriers to Discharge None    Equipment Recommendations  None recommended by OT          Precautions / Restrictions Precautions Precautions: Fall Restrictions Weight Bearing Restrictions: No   Pertinent Vitals/Pain Swollen Bil LEs (R>L)    ADL  Eating/Feeding: Simulated;Independent Where Assessed - Eating/Feeding: Chair Grooming: Performed;Supervision/safety;Wash/dry hands Where Assessed - Grooming: Unsupported standing Upper Body Bathing: Simulated;Set up;Supervision/safety Where Assessed - Upper Body Bathing: Unsupported sit to stand Lower Body Bathing: Simulated;Minimal assistance Where Assessed - Lower Body Bathing: Unsupported sit to stand Upper Body Dressing: Simulated;Supervision/safety;Set up Where Assessed - Upper Body Dressing: Unsupported sit to stand Lower Body Dressing: Minimal assistance Where Assessed - Lower Body Dressing: Unsupported sit to stand Toilet Transfer: Performed;Min guard Toilet Transfer Method: Sit to Barista: Regular height toilet;Grab bars Toileting - Clothing Manipulation and Hygiene: Performed;Supervision/safety Where Assessed - Engineer, mining and Hygiene: Standing Equipment Used: Gait belt;Rolling walker Transfers/Ambulation Related to ADLs: S with sit to stand and stand to sit, min guard a with ambulation with and without RW    OT Diagnosis: Generalized weakness  OT Problem List:  Impaired balance (sitting and/or standing);Decreased knowledge of use of DME or AE      Visit Information  Last OT Received On: 08/31/12 Assistance Needed: +1    Subjective Data  Subjective: I am independent, it may take me some time to get it done, but I do it myself. Patient Stated Goal: Be able to go home and not have to have my children help me, they have families of their own   Prior Functioning     Home Living Lives With: Family Available Help at Discharge: Available PRN/intermittently;Family Type of Home: House Home Access: Level entry Home Layout: One level Bathroom Shower/Tub: Forensic psychologist: Shower chair without back (but says she does not use it) Prior Function Level of Independence: Independent (per her report today) Driving: No Communication Communication: No difficulties Dominant Hand: Right            Cognition  Cognition Overall Cognitive Status: Appears within functional limits for tasks assessed/performed Arousal/Alertness: Awake/alert Orientation Level: Appears intact for tasks assessed Behavior During Session: [facility] for tasks performed    Extremity/Trunk Assessment Right Upper Extremity Assessment RUE ROM/Strength/Tone: Within functional levels Left Upper Extremity Assessment LUE ROM/Strength/Tone: Within functional levels     Mobility Bed Mobility Details for Bed Mobility Assistance: Pt up in recliner upon arrival Transfers Transfers: Sit to Stand;Stand to Sit Sit to Stand: 5: Supervision;With upper extremity assist;With armrests;From chair/3-in-1 Stand to Sit: 5: Supervision;With upper extremity assist;With armrests;To chair/3-in-1 Details for Transfer Assistance: No cueing needing for safe hand placement           End of Session OT - End of Session Equipment Utilized During Treatment: Gait belt Activity Tolerance: Patient tolerated treatment well Patient left: in  chair;with call bell/phone within reach       Reina Fuse  Carley Hammed [phone] 08/31/2012, 9:52 AM

## 2012-08-31 NOTE — Progress Notes (Signed)
Physical Therapy Treatment Patient Details Name: [name] MRN: [id number] DOB: 1935/02/12 Today's Date: 08/31/2012 Time: 4098-1191 PT Time Calculation (min): 17 min  PT Assessment / Plan / Recommendation Comments on Treatment Session  Patient attempted ambulation without assistive device - decreased balance and reaching for objects.  Encouraged patient to use RW at all times during ambulation for safety.  Patient also noted to have 3/4 dyspnea during gait, limiting progress today.    Follow Up Recommendations  Home health PT;Supervision - Intermittent     Does the patient have the potential to tolerate intense rehabilitation     Barriers to Discharge        Equipment Recommendations  Rolling walker with 5" wheels    Recommendations for Other Services    Frequency Min 3X/week   Plan Discharge plan remains appropriate;Frequency remains appropriate    Precautions / Restrictions Precautions Precautions: Fall Restrictions Weight Bearing Restrictions: No   Pertinent Vitals/Pain Dyspnea during gait 3/4    Mobility  Bed Mobility Bed Mobility: Not assessed (Patient sitting at EOB as PT entered room) Transfers Transfers: Sit to Stand;Stand to Sit Sit to Stand: 5: Supervision;With upper extremity assist;From bed Stand to Sit: 5: Supervision;With upper extremity assist;To bed Details for Transfer Assistance: Cuing to reach back for bed before sitting.  Patient with uncontrolled descent to bed. Ambulation/Gait Ambulation/Gait Assistance: 4: Min assist Ambulation Distance (Feet): 124 Feet Assistive device: 1 person hand held assist Ambulation/Gait Assistance Details: Patient reports she has been up in room without RW.  Ambulated today without RW.  Patient required hand-hold assist due to decreased balance.  Attempted to reach for objects in hallway - instructed patient that this was not safe.  Balance decreased without assistive device.   Gait Pattern: Step-through  pattern;Decreased stride length;Shuffle;Trunk flexed;Wide base of support Gait velocity: Very slow General Gait Details: Encouraged patient (son present during discussion) to use RW at all times during gait.  Instructed patient that reaching for objects for balance is not safe.      PT Goals Acute Rehab PT Goals PT Goal: Stand to Sit - Progress: Progressing toward goal PT Goal: Ambulate - Progress: Progressing toward goal  Visit Information  Last PT Received On: 08/31/12 Assistance Needed: +1    Subjective Data  Subjective: "I'm out of breath a little" (during gait)   Cognition  Cognition Overall Cognitive Status: Appears within functional limits for tasks assessed/performed Area of Impairment: Safety/judgement Arousal/Alertness: Awake/alert Orientation Level: Appears intact for tasks assessed Behavior During Session: [facility] for tasks performed Safety/Judgement: Decreased safety judgement for tasks assessed Safety/Judgement - Other Comments: Wants to ambulate without assistive device - reaching for objects during gait.    Balance     End of Session PT - End of Session Equipment Utilized During Treatment: Gait belt Activity Tolerance: Patient limited by fatigue (Dyspnea 3/4 during gait.) Patient left: in bed;with call bell/phone within reach;with family/visitor present (sitting at EOB) Nurse Communication: Mobility status   GP     Vena Austria 08/31/2012, 2:39 PM Durenda Hurt. Renaldo Fiddler, [facility] Acute Rehab Services Pager [phone]

## 2012-08-31 NOTE — Progress Notes (Signed)
Subjective:  The patient states that she feels better today.  Her Myoview stress test yesterday did not show any evidence of reversible ischemia.  Her ejection fraction is approximately 35%  Objective:  Vital Signs in the last 24 hours: Temp:  [97.6 F (36.4 C)-98 F (36.7 C)] 97.6 F (36.4 C) (03/17 0537) Pulse Rate:  [58-80] 80 (03/17 0537) Resp:  [18-19] 18 (03/17 0537) BP: (124-169)/(41-77) 153/77 mmHg (03/17 0537) SpO2:  [92 %-96 %] 96 % (03/17 0537) Weight:  [214 lb 3.2 oz (97.16 kg)] 214 lb 3.2 oz (97.16 kg) (03/17 0537)  Intake/Output from previous day: 03/16 0701 - 03/17 0700 In: 1060 [P.O.:1060] Out: 1375 [Urine:1375] Intake/Output from this shift: Total I/O In: 240 [P.O.:240] Out: -   . amLODipine  10 mg Oral Daily  . aspirin  81 mg Oral Daily  . atorvastatin  20 mg Oral q1800  . budesonide-formoterol  2 puff Inhalation BID  . carvedilol  6.25 mg Oral BID WC  . enoxaparin (LOVENOX) injection  30 mg Subcutaneous Q24H  . isosorbide mononitrate  90 mg Oral Daily  . levofloxacin  500 mg Oral Q48H  . sodium chloride  3 mL Intravenous Q12H  . sodium chloride  3 mL Intravenous Q12H      Physical Exam: The patient appears to be in no distress. Head and neck exam reveals that the pupils are equal and reactive. The extraocular movements are full. There is no scleral icterus. Mouth and pharynx are benign. No lymphadenopathy. No carotid bruits. The jugular venous pressure is normal. Thyroid is not enlarged or tender.  Chest reveals distant breath sounds. Mild expiratory wheezing.  Heart reveals no abnormal lift or heave. First and second heart sounds are normal. There is no murmur gallop rub or click.  The abdomen is soft and nontender. Bowel sounds are normoactive. There is no hepatosplenomegaly or mass. There are no abdominal bruits.  Extremities chronic pedal edema. Evidence of prior discoloration and scarring of the right lower extremity. Pedal pulses are equivocal    Neurologic exam is normal strength and no lateralizing weakness. No sensory deficits.  Integument reveals no rash   Lab Results:  Recent Labs  08/28/12 0955  WBC 8.6  HGB 11.4*  PLT 243    Recent Labs  08/30/12 0720 08/31/12 0740  NA 139 141  K 3.9 3.9  CL 100 102  CO2 28 31  GLUCOSE 96 89  BUN 55* 55*  CREATININE 2.61* 2.78*   No results found for this basename: TROPONINI, CK, MB,  in the last 72 hours Hepatic Function Panel  Recent Labs  08/30/12 0720  ALBUMIN 2.8*   No results found for this basename: CHOL,  in the last 72 hours No results found for this basename: PROTIME,  in the last 72 hours  Imaging: Imaging results have been reviewed  Cardiac Studies: Telemetry shows NSR. Assessment/Plan:  1. ischemic heart disease history of prior coronary artery stents. Recent chest pain with negative cardiac enzymes and EKG which is nonacute. She is not a candidate for cardiac catheterization because of her renal insufficiency. We will plan to maximize her medical therapy including statins, beta blockers, aspirin, and nitrates. Tobacco cessation advised.  2. congestive heart failure secondary to mixed systolic and diastolic acute on chronic CHF ejection fraction 35% by Myoview. Plan continue efforts to diurese watch renal function closely.   3. COPD with ongoing cigarette abuse.  4. chronic renal insufficiency.  Creatinine improved this am 2.78.  Received  IV Lasix yesterday.  Will switch back to oral Lasix today.  5. hypertensive cardiovascular disease   Plan: continue diuresis.  Anticipate home tomorrow.    LOS: 6 days    [name] 08/31/2012, 9:44 AM

## 2012-09-01 LAB — CREATININE, SERUM
Creatinine, Ser: 2.79 mg/dL — ABNORMAL HIGH (ref 0.50–1.10)
GFR calc non Af Amer: 16 mL/min — ABNORMAL LOW (ref >90)

## 2012-09-01 MED ORDER — RANOLAZINE ER 500 MG PO TB12
500.0000 mg | ORAL_TABLET | Freq: Two times a day (BID) | ORAL | Status: DC
  Administered 2012-09-01 (×2): 500 mg via ORAL
  Filled 2012-09-01 (×4): qty 1

## 2012-09-01 MED ORDER — ENALAPRIL MALEATE 5 MG PO TABS
5.0000 mg | ORAL_TABLET | Freq: Every day | ORAL | Status: DC
  Administered 2012-09-01: 5 mg via ORAL
  Filled 2012-09-01 (×2): qty 1

## 2012-09-01 MED ORDER — HYDROCORTISONE ACETATE 25 MG RE SUPP
25.0000 mg | Freq: Two times a day (BID) | RECTAL | Status: DC
  Administered 2012-09-01 (×2): 25 mg via RECTAL
  Filled 2012-09-01 (×4): qty 1

## 2012-09-01 MED ORDER — DOXYCYCLINE HYCLATE 100 MG PO TABS
100.0000 mg | ORAL_TABLET | Freq: Two times a day (BID) | ORAL | Status: DC
  Administered 2012-09-01 (×2): 100 mg via ORAL
  Filled 2012-09-01 (×5): qty 1

## 2012-09-01 NOTE — Plan of Care (Signed)
Problem: Phase I Progression Outcomes Goal: Dyspnea controlled at rest (HF) Outcome: Completed/Met Date Met:  09/01/12 Pt has not had any c/o SOB while at rest Goal: Initial discharge plan identified Outcome: Completed/Met Date Met:  09/01/12 Initial plan of dc is to go home with services

## 2012-09-01 NOTE — Progress Notes (Addendum)
Subjective: Problem of head lice noted - patient has had second treatment. Patient reports chest pain with ambulation yesterday requiring her to stop and rest. No chest pain at rest. She is also short of breath with exertion. She is very concerned about going home while still having exertional pain.  Patient does report some BRB per rectum- small amount. No rectal pain.  Objective: Lab: Lab Results  Component Value Date   WBC 8.6 08/28/2012   HGB 11.4* 08/28/2012   HCT 36.4 08/28/2012   MCV 81.1 08/28/2012   PLT 243 08/28/2012   BMET    Component Value Date/Time   NA 141 08/31/2012 0740   K 3.9 08/31/2012 0740   CL 102 08/31/2012 0740   CO2 31 08/31/2012 0740   GLUCOSE 89 08/31/2012 0740   BUN 55* 08/31/2012 0740   CREATININE 2.79* 09/01/2012 0557   CALCIUM 7.9* 08/31/2012 0740   GFRNONAA 16* 09/01/2012 0557   GFRAA 18* 09/01/2012 0557     Imaging:  Scheduled Meds: . amLODipine  10 mg Oral Daily  . aspirin  81 mg Oral Daily  . atorvastatin  20 mg Oral q1800  . budesonide-formoterol  2 puff Inhalation BID  . carvedilol  6.25 mg Oral BID WC  . enoxaparin (LOVENOX) injection  30 mg Subcutaneous Q24H  . furosemide  40 mg Oral BID  . isosorbide mononitrate  90 mg Oral Daily  . levofloxacin  500 mg Oral Q48H  . sodium chloride  3 mL Intravenous Q12H  . sodium chloride  3 mL Intravenous Q12H   Continuous Infusions:  PRN Meds:.sodium chloride, acetaminophen, acetaminophen, levalbuterol, ondansetron (ZOFRAN) IV, ondansetron, sodium chloride   Physical Exam: Filed Vitals:   09/01/12 0628  BP: 172/65  Pulse: 71  Temp: 97.8 F (36.6 C)  Resp: 20    Intake/Output Summary (Last 24 hours) at 09/01/12 0820 Last data filed at 09/01/12 1610  Gross per 24 hour  Intake   1080 ml  Output    900 ml  Net    180 ml  total for adm: - 5.2 liters Gen'l- obese white woman sitting on the side of the bed HEENT- C&S clear Cor- 2+ radial pulse, RRR Pulm - normal respirations, no rales or  wheezes Abd- obese Derm - did not examine scalp. Distal right LE with increased erythema, warmth and tenderness Neuro - at her baseline.       Assessment/Plan: 1. Cardiac - no plans for further risk stratification. With no ischemia on myoview no indication for high risk cath. Exertional chest pain is still a problem. Already on fairly high dose of Imdur.  Plan Will ask cardiology of Ranexa is a reasonable option for her.  2. HTN - renal function is stable but high.  Plan  Resume ACE-I  3. CKD III - stable creatinine but elevated. Will restart ACE-I and follow closely  Plan  as outpatient will need renal consult  4. ID - has completed levaquin but leg still looks infected.  Plan Doxycyline 100 mg bid  5. Pulmonary    improved with decreased wheezing  Plan Continue symbicort  6. GI - suspect patient with minor hemorrhoidal bleeding.  Plan  Anusol HC 2.5% suppository bid.   Dispo - d/c delayed due to persistent infection in right leg and exertional anginal    Illene Regulus [facility] (o) [phone]; (c) [phone] Call-grp - Patsi Sears IM  Tele: [phone]  09/01/2012, 8:20 AM

## 2012-09-01 NOTE — Progress Notes (Signed)
Physical Therapy Treatment Patient Details Name: [name] MRN: [id number] DOB: 02-03-1935 Today's Date: 09/01/2012 Time: 4098-1191 PT Time Calculation (min): 29 min  PT Assessment / Plan / Recommendation Comments on Treatment Session  Patient denies chest pain with ambulation today.  Does continue with right LE pain which she rates as severe, but doesn't seem to slow her down with walking as much as some shortness of breath.  Did encourage her to rely on help at home for heavier taskes including vacuuming, etc.  Will need HHPT at d/c.    Follow Up Recommendations  Home health PT;Supervision - Intermittent           Equipment Recommendations  Rolling walker with 5" wheels       Frequency Min 3X/week   Plan Discharge plan remains appropriate    Precautions / Restrictions Precautions Precautions: Fall Precaution Comments: contact isolation (lice)   Pertinent Vitals/Pain Right lower leg pain (severe per pt)    Mobility  Bed Mobility Supine to Sit: 6: Modified independent (Device/Increase time) Transfers Sit to Stand: 5: Supervision;From bed;With upper extremity assist Stand to Sit: 5: Supervision;To bed;With upper extremity assist Details for Transfer Assistance: cues for safety stand to sit due to uncontrolled descent Ambulation/Gait Ambulation/Gait Assistance: 5: Supervision Ambulation Distance (Feet): 180 Feet Assistive device: Rolling walker Ambulation/Gait Assistance Details: safe on straight path, but needs closer supervision in smaller spaces and with turns for safety Gait Pattern: Step-through pattern;Decreased stride length;Shuffle;Trunk flexed;Wide base of support    Exercises General Exercises - Lower Extremity Long Arc Quad: AROM;5 reps;Both;Seated Hip Flexion/Marching: AROM;Both;10 reps;Seated Other Exercises Other Exercises: sit<>stand x 4 reps cues for safety and slow descent     PT Goals Acute Rehab PT Goals Pt will go Sit to Stand: with  modified independence PT Goal: Sit to Stand - Progress: Goal set today Pt will go Stand to Sit: with modified independence PT Goal: Stand to Sit - Progress: Progressing toward goal Pt will Ambulate: >150 feet;with modified independence;with least restrictive assistive device PT Goal: Ambulate - Progress: Progressing toward goal Pt will Perform Home Exercise Program: with supervision, verbal cues required/provided PT Goal: Perform Home Exercise Program - Progress: Progressing toward goal  Visit Information  Last PT Received On: 09/01/12    Subjective Data  Subjective: No chest pain, just little short of breath.   Cognition  Cognition Overall Cognitive Status: Appears within functional limits for tasks assessed/performed Arousal/Alertness: Awake/alert Orientation Level: Appears intact for tasks assessed Behavior During Session: [facility] for tasks performed       End of Session PT - End of Session Equipment Utilized During Treatment: Gait belt Activity Tolerance: Patient tolerated treatment well Patient left: in bed;with call bell/phone within reach   GP     [facility] 09/01/2012, 4:55 PM [place], [place] [phone] 09/01/2012

## 2012-09-01 NOTE — Progress Notes (Signed)
Subjective:  The patient is still not doing well on exertion.  She tires easily and has dyspnea and chest tightness.  Her Myoview stress test did not show any reversible ischemia but she has poor LV function with ejection fraction 35%.  Objective:  Vital Signs in the last 24 hours: Temp:  [97.3 F (36.3 C)-97.8 F (36.6 C)] 97.8 F (36.6 C) (03/18 0628) Pulse Rate:  [61-71] 71 (03/18 0628) Resp:  [20] 20 (03/18 0628) BP: (134-172)/(51-76) 172/65 mmHg (03/18 0628) SpO2:  [94 %-97 %] 96 % (03/18 0809) Weight:  [215 lb 6.2 oz (97.7 kg)] 215 lb 6.2 oz (97.7 kg) (03/18 0628)  Intake/Output from previous day: 03/17 0701 - 03/18 0700 In: 1080 [P.O.:1080] Out: 900 [Urine:900] Intake/Output from this shift: Total I/O In: 240 [P.O.:240] Out: -   . amLODipine  10 mg Oral Daily  . aspirin  81 mg Oral Daily  . atorvastatin  20 mg Oral q1800  . budesonide-formoterol  2 puff Inhalation BID  . carvedilol  6.25 mg Oral BID WC  . doxycycline  100 mg Oral Q12H  . enalapril  5 mg Oral Daily  . enoxaparin (LOVENOX) injection  30 mg Subcutaneous Q24H  . furosemide  40 mg Oral BID  . hydrocortisone  25 mg Rectal BID  . isosorbide mononitrate  90 mg Oral Daily  . ranolazine  500 mg Oral BID  . sodium chloride  3 mL Intravenous Q12H  . sodium chloride  3 mL Intravenous Q12H      Physical Exam: The patient appears to be in no distress. Head and neck exam reveals that the pupils are equal and reactive. The extraocular movements are full. There is no scleral icterus. Mouth and pharynx are benign. No lymphadenopathy. No carotid bruits. The jugular venous pressure is normal. Thyroid is not enlarged or tender.  Chest reveals distant breath sounds. Mild expiratory wheezing.  Heart reveals no abnormal lift or heave. First and second heart sounds are normal. There is no murmur gallop rub or click.  The abdomen is soft and nontender. Bowel sounds are normoactive. There is no hepatosplenomegaly or  mass. There are no abdominal bruits.  Extremities chronic pedal edema. Evidence of prior discoloration and scarring of the right lower extremity. Pedal pulses are equivocal  Neurologic exam is normal strength and no lateralizing weakness. No sensory deficits.  Integument reveals no rash   Lab Results: No results found for this basename: WBC, HGB, PLT,  in the last 72 hours  Recent Labs  08/30/12 0720 08/31/12 0740 09/01/12 0557  NA 139 141  --   K 3.9 3.9  --   CL 100 102  --   CO2 28 31  --   GLUCOSE 96 89  --   BUN 55* 55*  --   CREATININE 2.61* 2.78* 2.79*   No results found for this basename: TROPONINI, CK, MB,  in the last 72 hours Hepatic Function Panel  Recent Labs  08/30/12 0720  ALBUMIN 2.8*   No results found for this basename: CHOL,  in the last 72 hours No results found for this basename: PROTIME,  in the last 72 hours  Imaging: Imaging results have been reviewed  Cardiac Studies: Telemetry shows NSR. Assessment/Plan:  1. ischemic heart disease history of prior coronary artery stents. Recent chest pain with negative cardiac enzymes and EKG which is nonacute. She is not a candidate for cardiac catheterization because of her renal insufficiency. We will plan to maximize her medical  therapy including statins, beta blockers, aspirin, and nitrates. Tobacco cessation advised.  Agree with trial of Ranexa. 2. congestive heart failure secondary to mixed systolic and diastolic acute on chronic CHF ejection fraction 35% by Myoview. Plan continue efforts to diurese watch renal function closely.   3. COPD with ongoing cigarette abuse.  4. chronic renal insufficiency.  Creatinine 2.79 today, unchanged from yesterday. 5. hypertensive cardiovascular disease   Plan: continue diuresis.  Trial of Ranexa for exertional chest tightness and dyspnea.    LOS: 7 days    Cassell Clement 09/01/2012, 8:48 AM

## 2012-09-02 ENCOUNTER — Other Ambulatory Visit: Payer: Self-pay | Admitting: Internal Medicine

## 2012-09-02 ENCOUNTER — Telehealth: Payer: Self-pay | Admitting: Internal Medicine

## 2012-09-02 LAB — GLUCOSE, CAPILLARY

## 2012-09-02 MED ORDER — AMLODIPINE BESYLATE 5 MG PO TABS: 10.0000 mg | ORAL_TABLET | Freq: Every day | ORAL | Status: DC

## 2012-09-02 MED ORDER — ASPIRIN 81 MG PO CHEW: 81.0000 mg | CHEWABLE_TABLET | Freq: Every day | ORAL | Status: DC

## 2012-09-02 MED ORDER — FUROSEMIDE 40 MG PO TABS: 40.0000 mg | ORAL_TABLET | Freq: Two times a day (BID) | ORAL | Status: DC

## 2012-09-02 MED ORDER — RANOLAZINE ER 500 MG PO TB12: 500.0000 mg | ORAL_TABLET | Freq: Two times a day (BID) | ORAL | Status: DC

## 2012-09-02 MED ORDER — SULFAMETHOXAZOLE-TMP DS 800-160 MG PO TABS: 1.0000 | ORAL_TABLET | Freq: Two times a day (BID) | ORAL | Status: DC

## 2012-09-02 MED ORDER — BUDESONIDE-FORMOTEROL FUMARATE 80-4.5 MCG/ACT IN AERO: 2.0000 | INHALATION_SPRAY | Freq: Two times a day (BID) | RESPIRATORY_TRACT | Status: DC

## 2012-09-02 MED ORDER — ATORVASTATIN CALCIUM 20 MG PO TABS: 20.0000 mg | ORAL_TABLET | Freq: Every day | ORAL | Status: DC

## 2012-09-02 MED ORDER — ENALAPRIL MALEATE 5 MG PO TABS: 5.0000 mg | ORAL_TABLET | Freq: Every day | ORAL | Status: DC

## 2012-09-02 MED ORDER — ISOSORBIDE DINITRATE 30 MG PO TABS: 30.0000 mg | ORAL_TABLET | Freq: Three times a day (TID) | ORAL | Status: DC

## 2012-09-02 NOTE — Progress Notes (Signed)
Pt being d/c to home, pt given d/c instructions, educated on where to pick up medications, pt also informed she needs to do another treatment for lice, pt verbalized understanding, pt left via wheelchair with family, pt stable upon d/c

## 2012-09-02 NOTE — Progress Notes (Signed)
Subjective:  The patient feels better this a.m.  She appears to be having a beneficial response to Ranexa.  She is anxious to go home today.  Objective:  Vital Signs in the last 24 hours: Temp:  [97.4 F (36.3 C)-97.8 F (36.6 C)] 97.4 F (36.3 C) (03/19 0618) Pulse Rate:  [57-75] 57 (03/19 0618) Resp:  [18-20] 18 (03/19 0618) BP: (121-144)/(47-76) 144/58 mmHg (03/19 0618) SpO2:  [94 %-98 %] 94 % (03/19 0618) Weight:  [217 lb 12.8 oz (98.793 kg)] 217 lb 12.8 oz (98.793 kg) (03/19 0618)  Intake/Output from previous day: 03/18 0701 - 03/19 0700 In: 720 [P.O.:720] Out: 1400 [Urine:1400] Intake/Output from this shift:    . amLODipine  10 mg Oral Daily  . aspirin  81 mg Oral Daily  . atorvastatin  20 mg Oral q1800  . budesonide-formoterol  2 puff Inhalation BID  . carvedilol  6.25 mg Oral BID WC  . doxycycline  100 mg Oral Q12H  . enalapril  5 mg Oral Daily  . enoxaparin (LOVENOX) injection  30 mg Subcutaneous Q24H  . furosemide  40 mg Oral BID  . hydrocortisone  25 mg Rectal BID  . isosorbide mononitrate  90 mg Oral Daily  . ranolazine  500 mg Oral BID  . sodium chloride  3 mL Intravenous Q12H      Physical Exam: The patient appears to be in no distress. Head and neck exam reveals that the pupils are equal and reactive. The extraocular movements are full. There is no scleral icterus. Mouth and pharynx are benign. No lymphadenopathy. No carotid bruits. The jugular venous pressure is normal. Thyroid is not enlarged or tender.  Chest reveals distant breath sounds. Mild expiratory wheezing.  Heart reveals no abnormal lift or heave. First and second heart sounds are normal. There is no murmur gallop rub or click.  The abdomen is soft and nontender. Bowel sounds are normoactive. There is no hepatosplenomegaly or mass. There are no abdominal bruits.  Extremities chronic pedal edema. Evidence of prior discoloration and scarring of the right lower extremity. Pedal pulses are  equivocal  Neurologic exam is normal strength and no lateralizing weakness. No sensory deficits.  Integument reveals no rash   Lab Results: No results found for this basename: WBC, HGB, PLT,  in the last 72 hours  Recent Labs  08/31/12 0740 09/01/12 0557  NA 141  --   K 3.9  --   CL 102  --   CO2 31  --   GLUCOSE 89  --   BUN 55*  --   CREATININE 2.78* 2.79*   No results found for this basename: TROPONINI, CK, MB,  in the last 72 hours Hepatic Function Panel No results found for this basename: PROT, ALBUMIN, AST, ALT, ALKPHOS, BILITOT, BILIDIR, IBILI,  in the last 72 hours No results found for this basename: CHOL,  in the last 72 hours No results found for this basename: PROTIME,  in the last 72 hours  Imaging: Imaging results have been reviewed  Cardiac Studies: Telemetry shows NSR. Assessment/Plan:  1. ischemic heart disease history of prior coronary artery stents. Recent chest pain with negative cardiac enzymes and EKG which is nonacute. She is not a candidate for cardiac catheterization because of her renal insufficiency. We will plan to maximize her medical therapy including statins, beta blockers, aspirin, and nitrates. Tobacco cessation advised.  Agree with trial of Ranexa. 2. congestive heart failure secondary to mixed systolic and diastolic acute on chronic  CHF ejection fraction 35% by Myoview. Plan continue efforts to diurese watch renal function closely.   3. COPD with ongoing cigarette abuse.  4. chronic renal insufficiency.   5. hypertensive cardiovascular disease   Plan: Home today.  Continue same medication.  Follow up with Dr. Debby Bud and I will be happy to see her back in my office on a when necessary basis also   LOS: 8 days    Cassell Clement 09/02/2012, 7:43 AM

## 2012-09-02 NOTE — Telephone Encounter (Signed)
Patient persistent lice in scalp per hospital RN. She has had 3 treatments with elemite 1%..  Patient called and instructed to get NIX, OTC and to use as shampoo.

## 2012-09-02 NOTE — Progress Notes (Signed)
Subjective: Comfortable without chest pain. Ready to go home. Reports that she will have a struggle paying for medications  Objective: Lab: Lab Results  Component Value Date   WBC 8.6 08/28/2012   HGB 11.4* 08/28/2012   HCT 36.4 08/28/2012   MCV 81.1 08/28/2012   PLT 243 08/28/2012   BMET    Component Value Date/Time   NA 141 08/31/2012 0740   K 3.9 08/31/2012 0740   CL 102 08/31/2012 0740   CO2 31 08/31/2012 0740   GLUCOSE 89 08/31/2012 0740   BUN 55* 08/31/2012 0740   CREATININE 2.79* 09/01/2012 0557   CALCIUM 7.9* 08/31/2012 0740   GFRNONAA 16* 09/01/2012 0557   GFRAA 18* 09/01/2012 0557     Imaging:  Scheduled Meds: . amLODipine  10 mg Oral Daily  . aspirin  81 mg Oral Daily  . atorvastatin  20 mg Oral q1800  . budesonide-formoterol  2 puff Inhalation BID  . carvedilol  6.25 mg Oral BID WC  . doxycycline  100 mg Oral Q12H  . enalapril  5 mg Oral Daily  . enoxaparin (LOVENOX) injection  30 mg Subcutaneous Q24H  . furosemide  40 mg Oral BID  . hydrocortisone  25 mg Rectal BID  . isosorbide mononitrate  90 mg Oral Daily  . ranolazine  500 mg Oral BID  . sodium chloride  3 mL Intravenous Q12H   Continuous Infusions:  PRN Meds:.acetaminophen, acetaminophen, levalbuterol, ondansetron (ZOFRAN) IV, ondansetron   Physical Exam: Filed Vitals:   09/02/12 0618  BP: 144/58  Pulse: 57  Temp: 97.4 F (36.3 C)  Resp: 18   See d/c/ summary     Assessment/Plan: For d/c home with home health. F-F encounter form done 3/17. Dictated [id number]   Illene Regulus [place] [facility] (o) [phone]; (c) [phone] Call-grp - Patsi Sears IM  Tele: [phone]  09/02/2012, 6:48 AM

## 2012-09-02 NOTE — Discharge Summary (Signed)
[name], [name]            ACCOUNT NO.:  [id number]  MEDICAL RECORD NO.:  [id number]  LOCATION:  4731                         FACILITY:  [facility]  PHYSICIAN:  Rosalyn Gess. Darnise Montag, MD  DATE OF BIRTH:  14-Aug-1934  DATE OF ADMISSION:  08/25/2012 DATE OF DISCHARGE:  09/02/2012                              DISCHARGE SUMMARY   ADMITTING DIAGNOSES: 1. Crescendo angina. 2. Shortness of breath with exertion. 3. Hypertension. 4. Chronic kidney disease stage III with acute exacerbation.  DISCHARGE DIAGNOSES: 1. Angina that is stable with probable coronary artery disease. 2. Hypertension stable at time of discharge. 3. Chronic kidney disease III with acute exacerbation. 4. Cellulitis of distal right lower extremity. 5. Pediculosis treated x3 with Elimite 1% solution. 6. Chronic obstructive pulmonary disease with an asthmatic component. 7. Mild hemorrhoidal bleeding.  CONSULTANTS:  [facility]ell Clement, M.D., [facility].  PROCEDURES: 1. Chest x-ray on the day of admission which showed cardiac     enlargement and aortic prominence.  Interstitial prominence may     reflect interstitial edema.  Left lung base opacity read as     atelectasis versus infiltrate with small effusions not excluded. 2. Pulmonary perfusion study with no appreciable diffusion defects,     very low probability for PE. 3. Myoview nuclear stress test performed March 16 with no evidence of     pharmacological induced myocardial ischemia.  Large myocardial scar     involving the inferior and apical walls.  Global hypokinesis with     greatest involvement of the inferior septal walls.  Calculated     ejection fraction of 35%.  A 2D echocardiogram performed August 25, 2012 with an EF of 40-45% decreased septal motion.  Inferolateral     hypokinesis.  Grade 1 diastolic dysfunction.  Left atrium     moderately dilated.  HISTORY OF PRESENT ILLNESS:  [name] is a 78 year old Caucasian woman who is  nonadherent with her medical regimen and has not had medical followup as directed.  The patient presented to the emergency department complaining of shortness of breath with a history of coronary artery disease.  The patient reports that for several weeks, she had noticed exertional chest pain and shortness of breath which had become progressively worse.  On the day of admission, she was unable to get relief from her chest pressure and shortness of breath with 3 nitroglycerins leading to her presentation to the emergency department.  The patient has a history of prior CVA that has left her with a mild dysarthria, but no significant motor abnormalities or limitations.    The patient was treated with Solu-Medrol and Lasix in the emergency department with improvement in respiratory function.  She was subsequently admitted.  ProBNP at admission was 11,000.  Chest x-ray was strongly suggestive of interstitial edema.  Please see the H and P for past medical history, family history, social history, as well as admission physical exam.  HOSPITAL COURSE: 1. Cardiovascular.  The patient had cardiac enzymes that were negative     x3.  She had an EKG with no acute changes.  The patient was seen in     consultation by the Cardiology  Service.  Given her chronic kidney     disease, lack of elevation of cardiac enzymes and unremarkable EKG,     she was not felt to be a candidate for cardiac catheterization.     The patient did continue to have significant chest discomfort,     which was medically managed with increasing doses of Imdur.  The     patient did come to a Myoview study, which showed no active     ischemia after adenosine challenge.  The patient's significant     chest discomfort was improved but persistent and on the day prior     to discharge, she was started on Ranexa for better control.  By     report, the patient had a good afternoon and has not had any     recurrent exertional chest  pain or discomfort.  At this time, the     patient is not a candidate for any further cardiac risk     stratification or intervention.  She is medically stable on her     current regimen with controlled chest discomfort.  She is a high-     risk patient, but at this point no further intervention is     warranted.  She is ready for discharge home with close followup. 2. Hypertension.  The patient's blood pressure has trended upwards.     Her ACE inhibitor was held at the time of admission due to renal     insufficiency.  This was restarted on the day prior to discharge.     Her blood pressure is better controlled.  She will need close     followup with repeat BMET to monitor renal function. 3. CKD III-IV.  The patient's creatinine has been in the 2.7 to 3     range.  She has had reasonable urine output and did have good     diuresis.  The patient is a candidate for nephrology consult as an     outpatient. 4. CHF.  The patient was vigorously diuresed with IV Lasix.  Aldactone     which was started by Internal Medicine was discontinued by     Cardiology as being ineffective in a patient with chronic renal     insufficiency and low ejection fraction.  At the time of discharge,     she was doing well with oral Lasix.  She had a diuresis of 5.5 L     with marked improvement in her respirations.  Plan:  The patient     will continue on medical therapy. 5. ID.  The patient was originally started on Levaquin for potential     pneumonia, however, she had no clinical signs of     pneumonia with no leukocytosis or productive sputum and maintained good     oxygen saturations. The patient did have significant erythema and heat at the distal     right lower extremity with chronic venous stasis and cellulitis.     She did not seem to get great response from Levaquin and was     switched on the day prior to discharge to doxycycline 100 mg b.i.d.     for MRSA coverage and better skin coverage.  The  patient will     continue septra as an outpatient.  She will be followed by home     health to ensure the wound continues to improve. 6. Pulmonary.  The patient has a history of COPD with an asthmatic  component.  She did have significant wheezing which did not clear     with diuresis and was thought to be of pulmonary origin.  The     patient was started on Symbicort 80/4.5, 2 puffs b.i.d. and on this     regimen, she had marked improvement in pulmonary function and     decreased wheezing.  Plan:  The patient will continue Pulmicort as     an outpatient. 7. GI.  The patient did have some hematochezia most likely secondary     to hemorrhoids.  She was started on Anusol suppositories.  Her     hemoglobin remained stable.  With the patient's problems being stable with her current medical     regimen, she is felt ready for discharge to home.  Skilled care was     offered but she declined, preferring to be at home with home     health.  She does have grandsons who live with her and her son     lives nearby.  The patient is financially stressed with limited income and     Medicare coverage only.  The importance of getting her medications     was stressed emphatically to her and she was strongly urged if     money was a problem to get her family to rally-round and help to     cover her medications given that they are life preserving and that     without them she will have a decline in health and probable return     to hospital.  The patient voiced understanding of the need to take     her medications.  DISCHARGE PHYSICAL EXAMINATION:  VITAL SIGNS:  Temperature was 97.4, blood pressure 144/58, heart rate was 57, respirations 18, oxygen saturation on room air was 94%. GENERAL APPEARANCE:  This is markedly overweight Caucasian woman, sitting in a chair at the time of discharge exam in no distress. HEENT:  Conjunctivae and sclerae were clear.  Pupils equal, round, and reactive.  The  patient is edentulous. NECK:  Supple without thyromegaly. PULMONARY:  The patient has no increased work of breathing.  The patient had no rales or wheezes on examination. CARDIOVASCULAR:  The patient had 2+ radial pulse.  She had a regular rate and rhythm without murmurs, rubs, or gallops. BREASTS:  Deferred. ABDOMEN:  Markedly obese, soft with positive bowel sounds. PELVIC AND RECTAL:  Deferred. EXTREMITIES:  No deformities are noted.  The patient does have 2 to 3+ edema in the distal lower extremities. DERM:  The patient has marked erythema in the distal right lower extremity, in a swath approximately 5 cm from top to bottom.  She has marked discoloration of bluish tinge to a indentation in her distal leg which is suggestive of old ulcer with no active ulcer at this time.  The patient has no visible pediculosis, although hair combing exam was not performed. NEURO:  The patient is awake, alert.  She is oriented to person, place, time and context.  Her speech is intelligible, although mildly dysarthric.  The patient is able to move all of her extremities to command.  She was not stood or ambulated but this has been done by staff and she is able to make transfers and ambulate with minimal assistance.  FINAL LABORATORY:  Full BMET from 17 with sodium 141, potassium 3.9, chloride 102, CO2 of 31, BUN 55, creatinine 2.78, glucose was 89. Partial renal panel from the 18th is showing a  creatinine of 2.79. Final CBC from March 14 with a white count of 8600, hemoglobin of 11.4 g, platelet count 243,000.  Normal differential.  TSH from March 11 was 0.65.  Final imaging studies as above.  DISPOSITION:  The patient is to be discharged to home.  She is to have home health nursing to monitor CHF, medication adherence, and cellulitis.  She is to have home health PT and OT to improve mobilization, balance, and strength.  FOLLOWUP:  The patient is emphatically instructed to keep the  followup appointment in 5-7 days.  The office will call her with an appointment in that interval.  The patient once again is strongly encouraged to have family help with the cost of her medications which it is imperative that she take to maintain her health.  The patient's condition at time of discharge dictation is improved, stable, but guarded given her age, her financial limitations and her complex medical history.     Rosalyn Gess Su Duma, MD     MEN/MEDQ  D:  09/02/2012  T:  09/02/2012  Job:  [id number]  cc:   Cassell Clement, M.D.

## 2012-09-07 ENCOUNTER — Telehealth: Payer: Self-pay | Admitting: Family Medicine

## 2012-09-07 NOTE — Telephone Encounter (Signed)
Transitional Care Call.  Spoke with patient.  Discharged on 09/02/12.  Patient states that she is "feeling great" and feels "like a new person".  Denies any chest pain.  States that swelling has diminished.  Pt lives alone but has help from grandson and daughter in Social worker. [facility] Nurse visit on Friday 09/04/12.  Pt states BP was 118/49 (she says she does not really remember diastolic).  She was unable to fill new RX because of financial issues.  Denies that family members can help her out financially.  Reminded pt to contact [facility] for financial assistance per d/c instructions.  When asked if she had been contacted by PT/OT, pt was unsure but she did say someone called this AM and they are coming out today between 2-3.  Patient denies any questions for Dr. Debby Bud at this time.  Follow up scheduled for 09/09/12 at 3:30.

## 2012-09-09 ENCOUNTER — Encounter: Payer: Self-pay | Admitting: Internal Medicine

## 2012-09-09 ENCOUNTER — Ambulatory Visit (INDEPENDENT_AMBULATORY_CARE_PROVIDER_SITE_OTHER): Payer: Medicare Other | Admitting: Internal Medicine

## 2012-09-09 VITALS — BP 180/84 | HR 67 | Temp 98.0°F | Resp 16 | Ht 65.0 in | Wt 211.0 lb

## 2012-09-09 DIAGNOSIS — I10 Essential (primary) hypertension: Secondary | ICD-10-CM

## 2012-09-09 DIAGNOSIS — I2510 Atherosclerotic heart disease of native coronary artery without angina pectoris: Secondary | ICD-10-CM

## 2012-09-09 DIAGNOSIS — J449 Chronic obstructive pulmonary disease, unspecified: Secondary | ICD-10-CM

## 2012-09-09 DIAGNOSIS — I509 Heart failure, unspecified: Secondary | ICD-10-CM

## 2012-09-09 MED ORDER — MALATHION 0.5 % EX LOTN: TOPICAL_LOTION | Freq: Once | CUTANEOUS | Status: DC

## 2012-09-09 NOTE — Progress Notes (Signed)
Subjective:    Patient ID: [name], female    DOB: 1935/04/21, 78 y.o.   MRN: [id number]  HPI Patient recently admitted for CHF with SOB, fluid overload and persistent angina. She has h/o CAD but was not a candidate for a cath due to renal insufficiency. Evaluation included:  Myoview nuclear stress test performed March 16 with no evidence of  pharmacological induced myocardial ischemia. Large myocardial scar  involving the inferior and apical walls. Global hypokinesis with  greatest involvement of the inferior septal walls. Calculated  ejection fraction of 35%.   A 2D echocardiogram performed August 25, 2012 with an EF of 40-45% decreased septal motion. Inferolateral  hypokinesis. Grade 1 diastolic dysfunction. Left atrium  moderately dilated.  She was discharged in stable condition. She has not been taking Norvasc, lasix ranexa, aspirin, lipitor or isosorbide dinitrate as prescribed for lack of funds. She has been taking Coreg and Eanalapril. She states she understands that she has CAD, angina, and the need for these medications but hasn't had the money.  She reports that she is feeling better: no chest pain, she denies being short of breath. She reports that her leg swelling is better. Her legs are better by her report.   While in patient she had treatment x 3 with Premetharin for pediculosis capitis. She has had two treatments with NIX at home. She still has a burden of pediculosis.  Past Medical History  Diagnosis Date  . Anginal pain   . Myocardial infarction   . Hypertension   . CHF (congestive heart failure)   . Anemia   . Stroke   . Pneumonia   . Shortness of breath   . GERD (gastroesophageal reflux disease)   . Headache   . Arthritis   . Depression   . Renal dysfunction- Gd 4 08/29/2012  . Cancer     scalp  . Nephrolithiasis   . Hx of peptic ulcer   . Osteoarthritis, generalized   . Gastrointestinal hemorrhage    Past Surgical History  Procedure  Laterality Date  . Appendectomy    . Tonsillectomy    . Abdominal hysterectomy    . Cholecystectomy    . Eye surgery    . Colon surgery    . Colonoscopy  07/18/10  . Melanoma excision  1989    distal right LE    Family History  Problem Relation Age of Onset  . Diabetes Mother   . Hypertension Mother   . Diabetes Sister   . Cancer Son     breast  . Diabetes Sister    History   Social History  . Marital Status: Divorced    Spouse Name: N/A    Number of Children: N/A  . Years of Education: N/A   Occupational History  . Not on file.   Social History Main Topics  . Smoking status: Current Every Day Smoker    Types: Cigarettes  . Smokeless tobacco: Never Used  . Alcohol Use: No  . Drug Use: No  . Sexually Active: Not on file   Other Topics Concern  . Not on file   Social History Narrative   Graduate [employer]   Married '59-30 yrs/divorced   2 sons, '61 '62; 8 grandchildren   Works as Airline pilot, Engineer, manufacturing systems   Retired-lives in her own home and her son lives with her.     Current Outpatient Prescriptions on File Prior to Visit  Medication Sig Dispense Refill  . amLODipine (NORVASC)  5 MG tablet Take 2 tablets (10 mg total) by mouth daily.  30 tablet  11  . aspirin 81 MG chewable tablet Chew 1 tablet (81 mg total) by mouth daily.      Marland Kitchen atorvastatin (LIPITOR) 20 MG tablet Take 1 tablet (20 mg total) by mouth daily at 6 PM.  30 tablet  11  . budesonide-formoterol (SYMBICORT) 80-4.5 MCG/ACT inhaler Inhale 2 puffs into the lungs 2 (two) times daily.  1 Inhaler  11  . carvedilol (COREG) 6.25 MG tablet Take 6.25 mg by mouth 2 (two) times daily with a meal.      . enalapril (VASOTEC) 5 MG tablet Take 1 tablet (5 mg total) by mouth daily.  30 tablet  11  . furosemide (LASIX) 40 MG tablet Take 1 tablet (40 mg total) by mouth 2 (two) times daily.  60 tablet  11  . isosorbide dinitrate (ISORDIL) 30 MG tablet Take 1 tablet (30 mg total) by mouth 3 (three) times  daily.  90 tablet  11  . ranolazine (RANEXA) 500 MG 12 hr tablet Take 1 tablet (500 mg total) by mouth 2 (two) times daily.  60 tablet  11  . sulfamethoxazole-trimethoprim (BACTRIM DS) 800-160 MG per tablet Take 1 tablet by mouth 2 (two) times daily.  20 tablet  0   No current facility-administered medications on file prior to visit.      Review of Systems System review is negative for any constitutional, cardiac, pulmonary, GI or neuro symptoms or complaints other than as described in the HPI.     Objective:   Physical Exam Filed Vitals:   09/09/12 1539  BP: 180/84  Pulse: 67  Temp: 98 F (36.7 C)  Resp: 16   Wt Readings from Last 3 Encounters:  09/09/12 211 lb (95.709 kg)  09/02/12 217 lb 13 oz (98.8 kg)  08/07/10 213 lb (96.616 kg)   Gen'l - Elderly overweight white woman in no distress with a mild expressive dysarthria from CVA HEENT- [place]/AT, C&S clear Neckl - supple, no JVD Cor- 2+ radial, RRR PUlm - no increased WOB, normal breath sounds Ext - 1+ edema right > left Derm - healed ulceration distal right LE. Pediculosis with moving small black lice.  Lab Results  Component Value Date   WBC 8.6 08/28/2012   HGB 11.4* 08/28/2012   HCT 36.4 08/28/2012   PLT 243 08/28/2012   GLUCOSE 89 08/31/2012   CHOL 115 08/27/2012   TRIG 126 08/27/2012   HDL 54 08/27/2012   LDLCALC 36 08/27/2012   ALT 12 08/25/2012   AST 11 08/25/2012   NA 141 08/31/2012   K 3.9 08/31/2012   CL 102 08/31/2012   CREATININE 2.79* 09/01/2012   BUN 55* 08/31/2012   CO2 31 08/31/2012   TSH 0.625 08/25/2012   INR 0.98 07/15/2010         Assessment & Plan:  Head lice - she has had 3 treatments with premethrin and has persistent lice.  Plan Treatment with malathion  Sterilize all bedding and toweling

## 2012-09-09 NOTE — Patient Instructions (Addendum)
Thank you for coming in to see me.  I am happy that you are not having chest pain or shortness of breath.  It is very important you take the medications prescribed at discharge except if you are not have chest pain you do not need to get the Ranexa. All the other medications are important to keep you from having heart failure and to reduce the risk of another heart attack.  You still have head lice. A new Rx is for malathion cream - a different drug then you have used before.  I want to see you back in the office in 1 month.

## 2012-09-09 NOTE — Assessment & Plan Note (Signed)
Has not filled Rx as prescribed at discharge but reports she is asymptomatic.

## 2012-09-13 DIAGNOSIS — J4489 Other specified chronic obstructive pulmonary disease: Secondary | ICD-10-CM | POA: Insufficient documentation

## 2012-09-13 DIAGNOSIS — J449 Chronic obstructive pulmonary disease, unspecified: Secondary | ICD-10-CM | POA: Insufficient documentation

## 2012-09-13 NOTE — Assessment & Plan Note (Signed)
At today's exam she is moving air well w/o wheezing  Plan Continue symbicort two puffs twice a day.

## 2012-09-13 NOTE — Assessment & Plan Note (Signed)
BP Readings from Last 3 Encounters:  09/09/12 180/84  09/02/12 144/58  08/07/10 138/70   Poor control but she admits to not having taken or picked up her medications due to $$ issues

## 2012-09-13 NOTE — Assessment & Plan Note (Signed)
Cardiomyopathy with EF 40-45%. She reports she has been taking her lasix but not aldactone and she is missing several cardiac meds. No signs of decompensation at today's visit.   Plan She is to acquire her medications as soon as possible.

## 2012-09-15 ENCOUNTER — Other Ambulatory Visit: Payer: Self-pay

## 2012-09-15 DIAGNOSIS — N390 Urinary tract infection, site not specified: Secondary | ICD-10-CM

## 2012-09-15 DIAGNOSIS — I2510 Atherosclerotic heart disease of native coronary artery without angina pectoris: Secondary | ICD-10-CM

## 2012-09-15 DIAGNOSIS — I10 Essential (primary) hypertension: Secondary | ICD-10-CM

## 2012-09-15 DIAGNOSIS — I509 Heart failure, unspecified: Secondary | ICD-10-CM

## 2012-09-15 MED ORDER — CARVEDILOL 6.25 MG PO TABS: 6.2500 mg | ORAL_TABLET | Freq: Two times a day (BID) | ORAL | Status: DC

## 2012-09-17 ENCOUNTER — Other Ambulatory Visit (HOSPITAL_COMMUNITY): Payer: Self-pay | Admitting: Internal Medicine

## 2012-09-23 ENCOUNTER — Telehealth: Payer: Self-pay | Admitting: Internal Medicine

## 2012-09-23 NOTE — Telephone Encounter (Signed)
Bonita Quin RN with [facility] is calling because she is the patients heart failure case manager and she would like to know the name of the patients heart doctor if she has one, she can be reached at [phone] ext [phone]

## 2012-09-24 NOTE — Telephone Encounter (Signed)
Phone call to [facility] with [facility] [phone] ext [phone]. LM letting her know from what  I can see it looks like pt's cardiologist is Dr Maisie Fus C. Wall but this is information from 2008.

## 2012-10-17 ENCOUNTER — Other Ambulatory Visit (HOSPITAL_COMMUNITY): Payer: Self-pay | Admitting: Internal Medicine

## 2012-10-19 ENCOUNTER — Other Ambulatory Visit (HOSPITAL_COMMUNITY): Payer: Self-pay | Admitting: Internal Medicine

## 2012-11-10 ENCOUNTER — Ambulatory Visit: Payer: Medicare Other | Admitting: Internal Medicine

## 2012-11-10 DIAGNOSIS — Z0289 Encounter for other administrative examinations: Secondary | ICD-10-CM

## 2013-07-01 ENCOUNTER — Telehealth: Payer: Self-pay | Admitting: *Deleted

## 2013-07-01 NOTE — Telephone Encounter (Signed)
RN from [facility] called back.  She needed demographic info and pt's last height and weight from last ov.  Relayed information to RN.

## 2013-07-01 NOTE — Telephone Encounter (Signed)
Sarah Boros, RN Care Mgr with [employer] called regarding patient & a cardiac program.  When I attempted to return her call & clarify her requests, I left message on her answering machine to call back.  Her original message request was unclear.  Last OV with PCP 09/09/12

## 2014-02-20 ENCOUNTER — Encounter (HOSPITAL_COMMUNITY): Payer: Self-pay | Admitting: Internal Medicine

## 2014-02-20 ENCOUNTER — Emergency Department (HOSPITAL_COMMUNITY): Payer: Medicare Other

## 2014-02-20 ENCOUNTER — Inpatient Hospital Stay (HOSPITAL_COMMUNITY)
Admission: EM | Admit: 2014-02-20 | Discharge: 2014-02-24 | DRG: 280 | Disposition: A | Discharge status: Home Health | Payer: Medicare Other | Attending: Internal Medicine | Admitting: Internal Medicine

## 2014-02-20 DIAGNOSIS — Z85820 Personal history of malignant melanoma of skin: Secondary | ICD-10-CM | POA: Diagnosis not present

## 2014-02-20 DIAGNOSIS — Z9861 Coronary angioplasty status: Secondary | ICD-10-CM | POA: Diagnosis not present

## 2014-02-20 DIAGNOSIS — N289 Disorder of kidney and ureter, unspecified: Secondary | ICD-10-CM

## 2014-02-20 DIAGNOSIS — N039 Chronic nephritic syndrome with unspecified morphologic changes: Secondary | ICD-10-CM

## 2014-02-20 DIAGNOSIS — N179 Acute kidney failure, unspecified: Secondary | ICD-10-CM | POA: Diagnosis present

## 2014-02-20 DIAGNOSIS — I509 Heart failure, unspecified: Secondary | ICD-10-CM | POA: Diagnosis present

## 2014-02-20 DIAGNOSIS — Z79899 Other long term (current) drug therapy: Secondary | ICD-10-CM | POA: Diagnosis not present

## 2014-02-20 DIAGNOSIS — N184 Chronic kidney disease, stage 4 (severe): Secondary | ICD-10-CM | POA: Diagnosis present

## 2014-02-20 DIAGNOSIS — E669 Obesity, unspecified: Secondary | ICD-10-CM | POA: Diagnosis present

## 2014-02-20 DIAGNOSIS — Z6832 Body mass index (BMI) 32.0-32.9, adult: Secondary | ICD-10-CM

## 2014-02-20 DIAGNOSIS — I130 Hypertensive heart and chronic kidney disease with heart failure and stage 1 through stage 4 chronic kidney disease, or unspecified chronic kidney disease: Secondary | ICD-10-CM | POA: Diagnosis present

## 2014-02-20 DIAGNOSIS — I2510 Atherosclerotic heart disease of native coronary artery without angina pectoris: Secondary | ICD-10-CM | POA: Diagnosis present

## 2014-02-20 DIAGNOSIS — Z85828 Personal history of other malignant neoplasm of skin: Secondary | ICD-10-CM | POA: Diagnosis not present

## 2014-02-20 DIAGNOSIS — J9600 Acute respiratory failure, unspecified whether with hypoxia or hypercapnia: Secondary | ICD-10-CM | POA: Diagnosis present

## 2014-02-20 DIAGNOSIS — N189 Chronic kidney disease, unspecified: Secondary | ICD-10-CM

## 2014-02-20 DIAGNOSIS — J449 Chronic obstructive pulmonary disease, unspecified: Secondary | ICD-10-CM | POA: Diagnosis present

## 2014-02-20 DIAGNOSIS — I214 Non-ST elevation (NSTEMI) myocardial infarction: Secondary | ICD-10-CM | POA: Diagnosis present

## 2014-02-20 DIAGNOSIS — Z7982 Long term (current) use of aspirin: Secondary | ICD-10-CM

## 2014-02-20 DIAGNOSIS — Z8673 Personal history of transient ischemic attack (TIA), and cerebral infarction without residual deficits: Secondary | ICD-10-CM | POA: Diagnosis not present

## 2014-02-20 DIAGNOSIS — R0602 Shortness of breath: Secondary | ICD-10-CM | POA: Diagnosis not present

## 2014-02-20 DIAGNOSIS — I200 Unstable angina: Secondary | ICD-10-CM

## 2014-02-20 DIAGNOSIS — I10 Essential (primary) hypertension: Secondary | ICD-10-CM | POA: Diagnosis present

## 2014-02-20 DIAGNOSIS — F17200 Nicotine dependence, unspecified, uncomplicated: Secondary | ICD-10-CM | POA: Diagnosis present

## 2014-02-20 DIAGNOSIS — D631 Anemia in chronic kidney disease: Secondary | ICD-10-CM | POA: Diagnosis present

## 2014-02-20 DIAGNOSIS — I252 Old myocardial infarction: Secondary | ICD-10-CM

## 2014-02-20 DIAGNOSIS — Z91199 Patient's noncompliance with other medical treatment and regimen due to unspecified reason: Secondary | ICD-10-CM | POA: Diagnosis not present

## 2014-02-20 DIAGNOSIS — I5033 Acute on chronic diastolic (congestive) heart failure: Secondary | ICD-10-CM | POA: Insufficient documentation

## 2014-02-20 DIAGNOSIS — I249 Acute ischemic heart disease, unspecified: Secondary | ICD-10-CM

## 2014-02-20 DIAGNOSIS — Z9119 Patient's noncompliance with other medical treatment and regimen: Secondary | ICD-10-CM

## 2014-02-20 DIAGNOSIS — I110 Hypertensive heart disease with heart failure: Secondary | ICD-10-CM

## 2014-02-20 DIAGNOSIS — J4489 Other specified chronic obstructive pulmonary disease: Secondary | ICD-10-CM | POA: Diagnosis present

## 2014-02-20 DIAGNOSIS — I5043 Acute on chronic combined systolic (congestive) and diastolic (congestive) heart failure: Secondary | ICD-10-CM | POA: Diagnosis present

## 2014-02-20 DIAGNOSIS — I5040 Unspecified combined systolic (congestive) and diastolic (congestive) heart failure: Secondary | ICD-10-CM

## 2014-02-20 DIAGNOSIS — I517 Cardiomegaly: Secondary | ICD-10-CM

## 2014-02-20 DIAGNOSIS — R29898 Other symptoms and signs involving the musculoskeletal system: Secondary | ICD-10-CM

## 2014-02-20 DIAGNOSIS — I160 Hypertensive urgency: Secondary | ICD-10-CM

## 2014-02-20 DIAGNOSIS — I5021 Acute systolic (congestive) heart failure: Secondary | ICD-10-CM | POA: Diagnosis present

## 2014-02-20 HISTORY — DX: Patient's other noncompliance with medication regimen for other reason: Z91.148

## 2014-02-20 HISTORY — DX: Chronic kidney disease, stage 4 (severe): N18.4

## 2014-02-20 HISTORY — DX: Atherosclerotic heart disease of native coronary artery without angina pectoris: I25.10

## 2014-02-20 HISTORY — DX: Patient's other noncompliance with medication regimen: Z91.14

## 2014-02-20 HISTORY — DX: Chronic obstructive pulmonary disease, unspecified: J44.9

## 2014-02-20 HISTORY — DX: Unspecified hemorrhoids: K64.9

## 2014-02-20 HISTORY — DX: Chronic combined systolic (congestive) and diastolic (congestive) heart failure: I50.42

## 2014-02-20 LAB — BASIC METABOLIC PANEL
ANION GAP: 15 (ref 5–15)
BUN: 46 mg/dL — AB (ref 6–23)
CALCIUM: 7.7 mg/dL — AB (ref 8.4–10.5)
CO2: 27 mEq/L (ref 19–32)
CREATININE: 3.15 mg/dL — AB (ref 0.50–1.10)
Chloride: 104 mEq/L (ref 96–112)
GFR calc non Af Amer: 13 mL/min — ABNORMAL LOW (ref >90)
GFR, EST AFRICAN AMERICAN: 15 mL/min — AB (ref >90)
Glucose, Bld: 208 mg/dL — ABNORMAL HIGH (ref 70–99)
Potassium: 4.3 mEq/L (ref 3.7–5.3)
Sodium: 146 mEq/L (ref 137–147)

## 2014-02-20 LAB — URINALYSIS, ROUTINE W REFLEX MICROSCOPIC
Bilirubin Urine: NEGATIVE
GLUCOSE, UA: 100 mg/dL — AB
Hgb urine dipstick: NEGATIVE
KETONES UR: NEGATIVE mg/dL
Nitrite: NEGATIVE
PH: 6 (ref 5.0–8.0)
Protein, ur: 100 mg/dL — AB
SPECIFIC GRAVITY, URINE: 1.021 (ref 1.005–1.030)
Urobilinogen, UA: 1 mg/dL (ref 0.0–1.0)

## 2014-02-20 LAB — HEPATIC FUNCTION PANEL
ALK PHOS: 63 U/L (ref 39–117)
ALT: 8 U/L (ref 0–35)
AST: 20 U/L (ref 0–37)
Albumin: 3.2 g/dL — ABNORMAL LOW (ref 3.5–5.2)
Bilirubin, Direct: <0.2 mg/dL (ref 0.0–0.3)
TOTAL PROTEIN: 6.6 g/dL (ref 6.0–8.3)
Total Bilirubin: 0.2 mg/dL — ABNORMAL LOW (ref 0.3–1.2)

## 2014-02-20 LAB — CBC
HCT: 37.2 % (ref 36.0–46.0)
Hemoglobin: 11.2 g/dL — ABNORMAL LOW (ref 12.0–15.0)
MCH: 25.3 pg — AB (ref 26.0–34.0)
MCHC: 30.1 g/dL (ref 30.0–36.0)
MCV: 84 fL (ref 78.0–100.0)
Platelets: 267 10*3/uL (ref 150–400)
RBC: 4.43 MIL/uL (ref 3.87–5.11)
RDW: 17 % — AB (ref 11.5–15.5)
WBC: 12 10*3/uL — ABNORMAL HIGH (ref 4.0–10.5)

## 2014-02-20 LAB — TROPONIN I
TROPONIN I: 6.24 ng/mL — AB (ref <0.30)
Troponin I: 2.62 ng/mL (ref <0.30)
Troponin I: 6.11 ng/mL (ref <0.30)

## 2014-02-20 LAB — I-STAT TROPONIN, ED
Troponin i, poc: 0.04 ng/mL (ref 0.00–0.08)
Troponin i, poc: 0.95 ng/mL (ref 0.00–0.08)

## 2014-02-20 LAB — URINE MICROSCOPIC-ADD ON

## 2014-02-20 LAB — MRSA PCR SCREENING: MRSA by PCR: NEGATIVE

## 2014-02-20 LAB — HEPARIN LEVEL (UNFRACTIONATED): Heparin Unfractionated: 0.12 IU/mL — ABNORMAL LOW (ref 0.30–0.70)

## 2014-02-20 LAB — TSH: TSH: 1.42 u[IU]/mL (ref 0.350–4.500)

## 2014-02-20 LAB — PRO B NATRIURETIC PEPTIDE: Pro B Natriuretic peptide (BNP): 34048 pg/mL — ABNORMAL HIGH (ref 0–450)

## 2014-02-20 MED ORDER — FUROSEMIDE 10 MG/ML IJ SOLN
80.0000 mg | Freq: Two times a day (BID) | INTRAMUSCULAR | Status: AC
  Administered 2014-02-20 – 2014-02-21 (×4): 80 mg via INTRAVENOUS
  Filled 2014-02-20 (×4): qty 8

## 2014-02-20 MED ORDER — HYDRALAZINE HCL 20 MG/ML IJ SOLN
10.0000 mg | Freq: Four times a day (QID) | INTRAMUSCULAR | Status: DC | PRN
  Filled 2014-02-20: qty 1

## 2014-02-20 MED ORDER — NITROGLYCERIN IN D5W 200-5 MCG/ML-% IV SOLN
2.0000 ug/min | Freq: Once | INTRAVENOUS | Status: AC
  Administered 2014-02-20: 25 ug/min via INTRAVENOUS
  Filled 2014-02-20: qty 250

## 2014-02-20 MED ORDER — SODIUM CHLORIDE 0.9 % IJ SOLN
3.0000 mL | Freq: Two times a day (BID) | INTRAMUSCULAR | Status: DC
  Administered 2014-02-20 – 2014-02-24 (×9): 3 mL via INTRAVENOUS

## 2014-02-20 MED ORDER — ENOXAPARIN SODIUM 30 MG/0.3ML ~~LOC~~ SOLN: 30.0000 mg | SUBCUTANEOUS | Status: DC

## 2014-02-20 MED ORDER — ASPIRIN EC 325 MG PO TBEC: 325.0000 mg | DELAYED_RELEASE_TABLET | Freq: Every day | ORAL | Status: DC

## 2014-02-20 MED ORDER — HEPARIN BOLUS VIA INFUSION
3500.0000 [IU] | Freq: Once | INTRAVENOUS | Status: AC
  Administered 2014-02-20: 3500 [IU] via INTRAVENOUS
  Filled 2014-02-20: qty 3500

## 2014-02-20 MED ORDER — ASPIRIN 81 MG PO CHEW
81.0000 mg | CHEWABLE_TABLET | Freq: Every day | ORAL | Status: DC
  Administered 2014-02-20 – 2014-02-24 (×5): 81 mg via ORAL
  Filled 2014-02-20 (×6): qty 1

## 2014-02-20 MED ORDER — ISOSORBIDE DINITRATE 30 MG PO TABS: 30.0000 mg | ORAL_TABLET | Freq: Three times a day (TID) | ORAL | Status: DC

## 2014-02-20 MED ORDER — NITROGLYCERIN IN D5W 200-5 MCG/ML-% IV SOLN
2.0000 ug/min | INTRAVENOUS | Status: DC
  Administered 2014-02-20: 20 ug/min via INTRAVENOUS
  Administered 2014-02-21: 40 ug/min via INTRAVENOUS
  Filled 2014-02-20 (×2): qty 250

## 2014-02-20 MED ORDER — RANOLAZINE ER 500 MG PO TB12
500.0000 mg | ORAL_TABLET | Freq: Two times a day (BID) | ORAL | Status: DC
  Administered 2014-02-20 – 2014-02-22 (×5): 500 mg via ORAL
  Filled 2014-02-20 (×6): qty 1

## 2014-02-20 MED ORDER — ACETAMINOPHEN 325 MG PO TABS
650.0000 mg | ORAL_TABLET | ORAL | Status: DC | PRN
  Administered 2014-02-24: 650 mg via ORAL
  Filled 2014-02-20: qty 2

## 2014-02-20 MED ORDER — CARVEDILOL 6.25 MG PO TABS
6.2500 mg | ORAL_TABLET | Freq: Two times a day (BID) | ORAL | Status: DC
  Administered 2014-02-20 – 2014-02-23 (×7): 6.25 mg via ORAL
  Filled 2014-02-20 (×9): qty 1

## 2014-02-20 MED ORDER — AMLODIPINE BESYLATE 5 MG PO TABS
5.0000 mg | ORAL_TABLET | Freq: Every day | ORAL | Status: DC
  Administered 2014-02-20 – 2014-02-24 (×5): 5 mg via ORAL
  Filled 2014-02-20 (×5): qty 1

## 2014-02-20 MED ORDER — HEPARIN (PORCINE) IN NACL 100-0.45 UNIT/ML-% IJ SOLN
1450.0000 [IU]/h | INTRAMUSCULAR | Status: DC
  Administered 2014-02-20: 1100 [IU]/h via INTRAVENOUS
  Administered 2014-02-21 (×2): 1450 [IU]/h via INTRAVENOUS
  Filled 2014-02-20 (×5): qty 250

## 2014-02-20 MED ORDER — BUDESONIDE-FORMOTEROL FUMARATE 80-4.5 MCG/ACT IN AERO
2.0000 | INHALATION_SPRAY | Freq: Two times a day (BID) | RESPIRATORY_TRACT | Status: DC | PRN
  Filled 2014-02-20: qty 6.9

## 2014-02-20 MED ORDER — SODIUM CHLORIDE 0.9 % IJ SOLN
3.0000 mL | INTRAMUSCULAR | Status: DC | PRN
Start: 2014-02-20 — End: 2014-02-24

## 2014-02-20 MED ORDER — MORPHINE SULFATE 2 MG/ML IJ SOLN
2.0000 mg | Freq: Once | INTRAMUSCULAR | Status: AC
  Administered 2014-02-20: 2 mg via INTRAVENOUS
  Filled 2014-02-20: qty 1

## 2014-02-20 MED ORDER — FUROSEMIDE 10 MG/ML IJ SOLN
80.0000 mg | Freq: Once | INTRAMUSCULAR | Status: AC
  Administered 2014-02-20: 80 mg via INTRAVENOUS
  Filled 2014-02-20: qty 8

## 2014-02-20 MED ORDER — ONDANSETRON HCL 4 MG/2ML IJ SOLN: 4.0000 mg | Freq: Four times a day (QID) | INTRAMUSCULAR | Status: DC | PRN

## 2014-02-20 MED ORDER — HYDRALAZINE HCL 20 MG/ML IJ SOLN
10.0000 mg | Freq: Once | INTRAMUSCULAR | Status: AC
  Administered 2014-02-20: 10 mg via INTRAVENOUS
  Filled 2014-02-20: qty 1

## 2014-02-20 MED ORDER — ATORVASTATIN CALCIUM 20 MG PO TABS
20.0000 mg | ORAL_TABLET | Freq: Every day | ORAL | Status: DC
  Administered 2014-02-20 – 2014-02-23 (×4): 20 mg via ORAL
  Filled 2014-02-20 (×5): qty 1

## 2014-02-20 MED ORDER — SODIUM CHLORIDE 0.9 % IV SOLN
250.0000 mL | INTRAVENOUS | Status: DC | PRN
Start: 2014-02-20 — End: 2014-02-24

## 2014-02-20 NOTE — Progress Notes (Signed)
ANTICOAGULATION CONSULT NOTE - Follow Up  Pharmacy Consult for heparin Indication: chest pain/ACS  No Known Allergies  Patient Measurements: Height: 5\' 4"  (162.6 cm) Weight: 189 lb 6 oz (85.9 kg) IBW/kg (Calculated) : 54.7 Heparin Dosing Weight: 70.5 kg  Vital Signs: Temp: 97.7 F (36.5 C) (09/06 1600) Temp src: Oral (09/06 1600) BP: 113/68 mmHg (09/06 1845) Pulse Rate: 80 (09/06 1845)  Labs:  Recent Labs  02/20/14 0434 02/20/14 0715 02/20/14 1240 02/20/14 1831  HGB 11.2*  --   --   --   HCT 37.2  --   --   --   PLT 267  --   --   --   HEPARINUNFRC  --   --   --  0.12*  CREATININE 3.15*  --   --   --   TROPONINI  --  2.62* 6.24*  --     Estimated Creatinine Clearance: 15.9 ml/min (by C-G formula based on Cr of 3.15).   Medical History: Past Medical History  Diagnosis Date  . Myocardial infarction   . Hypertension   . Chronic combined systolic and diastolic CHF (congestive heart failure)     a. EF 45-50% in 8466. b. Mixed systolic/diastolic - EF 59-93% by echo 2014, 35% by nuc.  Marland Kitchen Anemia   . Stroke     a. 2008: acute stroke in left centrum ovale. 06/2010: hemorrhagic stroke.  Marland Kitchen GERD (gastroesophageal reflux disease)   . Headache(784.0)   . Arthritis   . Depression   . CKD (chronic kidney disease), stage IV 08/29/2012  . Cancer     scalp  . Nephrolithiasis   . Hx of peptic ulcer   . Osteoarthritis, generalized   . Gastrointestinal hemorrhage     a. Pt reports she has h/o this many years ago, requiring 2 units of blood. Thinks it was in [place] but records not available. She does not know why this happened.  Marland Kitchen CAD (coronary artery disease)   . H/O medication noncompliance     a. Per notes, due to cost.   . CAD (coronary artery disease)     a. Stents in [facility] 2001 and cath 2008 with possible balloon or stent at [facility] (not available in [place]).   . Hemorrhoid     a. Mild hemorrhoidal bleeding per previous DC note.  Marland Kitchen COPD (chronic obstructive  pulmonary disease)     Medications:  See med history  Assessment: 79 yo lady admitted with SOB.  Her troponin is elevated and she will start heparin.  Baseline labs as above.  She was not on anticoagulation PTA.  She is slightly below desired goal range of 0.3-0.7 on 1100 units/hr at 0.12 IU/ml.  No noted bleeding complications or IV site issues when I spoke with her nurse.  Goal of Therapy:  Heparin level 0.3-0.7 units/ml Monitor platelets by anticoagulation protocol: Yes   Plan:  Increase IV Heparin drip to 1250 units/hr Check heparin level in 8 hours   Daily HL and CBC while on heparin  Rober Minion, PharmD., MS Clinical Pharmacist Pager:  [phone] Thank you for allowing pharmacy to be part of this patients care team. 02/20/2014,7:10 PM

## 2014-02-20 NOTE — ED Provider Notes (Addendum)
CSN: [id number]     Arrival date & time 02/20/14  0429 History   First MD Initiated Contact with Patient 02/20/14 [phone]     Chief Complaint  Patient presents with  . Shortness of Breath  . Chest Pain     (Consider location/radiation/quality/duration/timing/severity/associated sxs/prior Treatment) HPI Patient presents with difficulty breathing, hypoxia and central chest pain. Level V caveat due to respiratory distress. EMS called and found the patient in tripoding position with oxygen saturations in the 80s. She was also noted to be profoundly hypertensive with systolic blood pressure greater than 200. EKG in route demonstrates diffuse ST depression. Patient was given nitroglycerin and aspirin. She continues to have central chest pressure. Past Medical History  Diagnosis Date  . Anginal pain   . Myocardial infarction   . Hypertension   . CHF (congestive heart failure)   . Anemia   . Stroke   . Pneumonia   . Shortness of breath   . GERD (gastroesophageal reflux disease)   . Headache   . Arthritis   . Depression   . Renal dysfunction- Gd 4 08/29/2012  . Cancer     scalp  . Nephrolithiasis   . Hx of peptic ulcer   . Osteoarthritis, generalized   . Gastrointestinal hemorrhage    Past Surgical History  Procedure Laterality Date  . Appendectomy    . Tonsillectomy    . Abdominal hysterectomy    . Cholecystectomy    . Eye surgery    . Colon surgery    . Colonoscopy  07/18/10  . Melanoma excision  1989    distal right LE    Family History  Problem Relation Age of Onset  . Diabetes Mother   . Hypertension Mother   . Diabetes Sister   . Cancer Son     breast  . Diabetes Sister    History  Substance Use Topics  . Smoking status: Current Every Day Smoker    Types: Cigarettes  . Smokeless tobacco: Never Used  . Alcohol Use: No   OB History   Grav Para Term Preterm Abortions TAB SAB Ect Mult Living                 Review of Systems  Unable to perform ROS: Acuity of  condition      Allergies  Review of patient's allergies indicates no known allergies.  Home Medications   Prior to Admission medications   Medication Sig Start Date End Date Taking? Authorizing Provider  amLODipine (NORVASC) 5 MG tablet Take 1 by mouth daily 10/19/12  Yes Neena Rhymes, MD  aspirin 81 MG chewable tablet Chew 1 tablet (81 mg total) by mouth daily. 09/02/12  Yes Neena Rhymes, MD  atorvastatin (LIPITOR) 20 MG tablet Take 1 tablet (20 mg total) by mouth daily at 6 PM. 09/02/12  Yes Neena Rhymes, MD  budesonide-formoterol [facility]) 80-4.5 MCG/ACT inhaler Inhale 2 puffs into the lungs 2 (two) times daily as needed (shortness of breath).   Yes Historical Provider, MD  carvedilol (COREG) 6.25 MG tablet Take 1 tablet (6.25 mg total) by mouth 2 (two) times daily with a meal. 09/15/12  Yes Neena Rhymes, MD  enalapril (VASOTEC) 5 MG tablet Take 5 mg by mouth daily.   Yes Historical Provider, MD  furosemide (LASIX) 40 MG tablet Take 40 mg by mouth 2 (two) times daily.   Yes Historical Provider, MD  isosorbide dinitrate (ISORDIL) 30 MG tablet Take 1 tablet (30 mg total)  by mouth 3 (three) times daily. 09/02/12  Yes Neena Rhymes, MD  ranolazine (RANEXA) 500 MG 12 hr tablet Take 1 tablet (500 mg total) by mouth 2 (two) times daily. 09/02/12  Yes Neena Rhymes, MD   BP 183/84  Pulse 97  Temp(Src) 98.2 F (36.8 C) (Oral)  Resp 26  SpO2 100% Physical Exam  Nursing note and vitals reviewed. Constitutional: She is oriented to person, place, and time. She appears well-developed and well-nourished. She appears distressed.  Bending forward in stretcher  HENT:  Head: Normocephalic and atraumatic.  Mouth/Throat: Oropharynx is clear and moist.  Eyes: EOM are normal. Pupils are equal, round, and reactive to light.  Neck: Normal range of motion. Neck supple. JVD present.  Cardiovascular: Normal rate and regular rhythm.   Pulmonary/Chest: No respiratory distress. She has no  wheezes. She has rales (scattered crackles in the bases.).  Increased respiratory effort and rate. Speaking in several word sentences  Abdominal: Soft. Bowel sounds are normal. She exhibits no distension and no mass. There is no tenderness. There is no rebound and no guarding.  Musculoskeletal: Normal range of motion. She exhibits edema. She exhibits no tenderness.  2+ bilateral pitting edema.  Neurological: She is alert and oriented to person, place, and time.  Moves all extremities. Sensation grossly intact.  Skin: Skin is warm. No rash noted. She is diaphoretic. No erythema.  Psychiatric: She has a normal mood and affect. Her behavior is normal.    ED Course  Procedures (including critical care time) Labs Review Labs Reviewed  CBC - Abnormal; Notable for the following:    WBC 12.0 (*)    Hemoglobin 11.2 (*)    MCH 25.3 (*)    RDW 17.0 (*)    All other components within normal limits  BASIC METABOLIC PANEL - Abnormal; Notable for the following:    Glucose, Bld 208 (*)    BUN 46 (*)    Creatinine, Ser 3.15 (*)    Calcium 7.7 (*)    GFR calc non Af Amer 13 (*)    GFR calc Af Amer 15 (*)    All other components within normal limits  PRO B NATRIURETIC PEPTIDE - Abnormal; Notable for the following:    Pro B Natriuretic peptide (BNP) 34048.0 (*)    All other components within normal limits  URINALYSIS, ROUTINE W REFLEX MICROSCOPIC - Abnormal; Notable for the following:    APPearance CLOUDY (*)    Glucose, UA 100 (*)    Protein, ur 100 (*)    Leukocytes, UA TRACE (*)    All other components within normal limits  URINE MICROSCOPIC-ADD ON - Abnormal; Notable for the following:    Squamous Epithelial / LPF MANY (*)    Bacteria, UA MANY (*)    Casts GRANULAR CAST (*)    All other components within normal limits  I-STAT TROPOININ, ED    Imaging Review Dg Chest Port 1 View  02/20/2014   CLINICAL DATA:  Shortness of breath and chest pain  EXAM: PORTABLE CHEST - 1 VIEW  COMPARISON:   08/25/2012  FINDINGS: Technically limited study due to patient positioning. Shallow inspiration. Heart size and pulmonary vascularity appear increased. Diffuse interstitial changes in the lungs. No focal consolidation. No blunting of costophrenic angles. No pneumothorax.  IMPRESSION: Cardiac enlargement with mild vascular congestion and interstitial edema.   Electronically Signed   By: Lucienne Capers M.D.   On: 02/20/2014 04:51     EKG Interpretation None  Date: 02/20/2014  Rate:113  Rhythm: sinus tachycardia  QRS Axis: normal  Intervals: QRS prolonged  ST/T Wave abnormalities: ST depressions inferiorly and ST depressions laterally  Conduction Disutrbances:nonspecific intraventricular conduction delay  Narrative Interpretation:   Old EKG Reviewed: Similar morphology to previous EKG though ST depression much more exaggerated   CRITICAL CARE Performed by: Lita Mains, Aleksandr Pellow Total critical care time: 30 min Critical care time was exclusive of separately billable procedures and treating other patients. Critical care was necessary to treat or prevent imminent or life-threatening deterioration. Critical care was time spent personally by me on the following activities: development of treatment plan with patient and/or surrogate as well as nursing, discussions with consultants, evaluation of patient's response to treatment, examination of patient, obtaining history from patient or surrogate, ordering and performing treatments and interventions, ordering and review of laboratory studies, ordering and review of radiographic studies, pulse oximetry and re-evaluation of patient's condition.   MDM   Final diagnoses:  CHF exacerbation    Patient started on nitroglycerin drip and given 80 mg of Lasix emergency department. We'll monitor closely  Patient's blood pressure has decreased on nitroglycerin drip. She states she is now chest pain-free. Her breathing has improved. Discussed with Dr.  Arnoldo Morale of [facility]. Will admit to step down bed.  Julianne Rice, MD 02/20/14 [phone]   Repeat troponin came back at 0.95. Inform hospitalist service. Discussed with Dr. Rayann Heman cardiology. Will consult on the patient. Patient remains chest pain-free with stable vital signs.  Julianne Rice, MD 02/20/14 [phone]

## 2014-02-20 NOTE — H&P (Signed)
[facility] Admission History and Physical       WALDA HERTZOG KNL:[id number] DOB: [birth year] DOA: 02/20/2014  Referring physician:  EDP PCP: Adella Hare, MD  Specialists:   Chief Complaint:  SOB  HPI: [name] is a 79 y.o. female with a history of CAD, CHF, HTN, CKD, who presents to the ED with complaints of worsening SOB and DOE and lower leg edema over the past week, worse this evening, awakening her from sleep with chest pain and tightness, and orthopnea.   She had hypoxemia with O2 sats at 80%, and was placed on 100% O2 and had improvement in her O2 sats.   Her BNP was [place] to [place] 34048.0, and her chest X-Ray reveals pulmonary edema. She was administered 80 mg of IV Lasix X1 in the ED and referred for admission.     Review of Systems:  Constitutional: No Weight Loss, No Weight Gain, Night Sweats, Fevers, Chills, Dizziness, Fatigue, or Generalized Weakness HEENT: No Headaches, Difficulty Swallowing,Tooth/Dental Problems,Sore Throat,  No Sneezing, Rhinitis, Ear Ache, Nasal Congestion, or Post Nasal Drip,  Cardio-vascular:  +Chest pain, +Orthopnea, PND, +Edema in Lower Extremities, Anasarca, Dizziness, Palpitations  Resp: +Dyspnea, +DOE, No Cough, No Hemoptysis, No Wheezing.    GI: No Heartburn, Indigestion, Abdominal Pain, Nausea, Vomiting, Diarrhea, Hematemesis, Hematochezia, Melena, Change in Bowel Habits,  Loss of Appetite  GU: No Dysuria, Change in Color of Urine, No Urgency or Frequency, No Flank pain.  Musculoskeletal: No Joint Pain or Swelling, No Decreased Range of Motion, No Back Pain.  Neurologic: No Syncope, No Seizures, Muscle Weakness, Paresthesia, Vision Disturbance or Loss, No Diplopia, No Vertigo, No Difficulty Walking,  Skin: No Rash or Lesions. Psych: No Change in Mood or Affect, No Depression or Anxiety, No Memory loss, No Confusion, or Hallucinations   Past Medical History  Diagnosis Date  . Anginal pain   . Myocardial infarction   .  Hypertension   . CHF (congestive heart failure)   . Anemia   . Stroke   . Pneumonia   . Shortness of breath   . GERD (gastroesophageal reflux disease)   . Headache(784.0)   . Arthritis   . Depression   . Renal dysfunction- Gd 4 08/29/2012  . Cancer     scalp  . Nephrolithiasis   . Hx of peptic ulcer   . Osteoarthritis, generalized   . Gastrointestinal hemorrhage   . CAD (coronary artery disease)       Past Surgical History  Procedure Laterality Date  . Appendectomy    . Tonsillectomy    . Abdominal hysterectomy    . Cholecystectomy    . Eye surgery    . Colon surgery    . Colonoscopy  07/18/10  . Melanoma excision  1989    distal right LE        Prior to Admission medications   Medication Sig Start Date End Date Taking? Authorizing Provider  amLODipine (NORVASC) 5 MG tablet Take 1 by mouth daily 10/19/12  Yes Neena Rhymes, MD  aspirin 81 MG chewable tablet Chew 1 tablet (81 mg total) by mouth daily. 09/02/12  Yes Neena Rhymes, MD  atorvastatin (LIPITOR) 20 MG tablet Take 1 tablet (20 mg total) by mouth daily at 6 PM. 09/02/12  Yes Neena Rhymes, MD  budesonide-formoterol [facility]) 80-4.5 MCG/ACT inhaler Inhale 2 puffs into the lungs 2 (two) times daily as needed (shortness of breath).   Yes Historical Provider, MD  carvedilol (COREG) 6.25 MG  tablet Take 1 tablet (6.25 mg total) by mouth 2 (two) times daily with a meal. 09/15/12  Yes Neena Rhymes, MD  enalapril (VASOTEC) 5 MG tablet Take 5 mg by mouth daily.   Yes Historical Provider, MD  furosemide (LASIX) 40 MG tablet Take 40 mg by mouth 2 (two) times daily.   Yes Historical Provider, MD  isosorbide dinitrate (ISORDIL) 30 MG tablet Take 1 tablet (30 mg total) by mouth 3 (three) times daily. 09/02/12  Yes Neena Rhymes, MD  ranolazine (RANEXA) 500 MG 12 hr tablet Take 1 tablet (500 mg total) by mouth 2 (two) times daily. 09/02/12  Yes Neena Rhymes, MD      No Known Allergies   Social History:   reports that she has been smoking Cigarettes.  She has been smoking about 0.00 packs per day. She has never used smokeless tobacco. She reports that she does not drink alcohol or use illicit drugs.     Family History  Problem Relation Age of Onset  . Diabetes Mother   . Hypertension Mother   . Diabetes Sister   . Cancer Son     breast  . Diabetes Sister        Physical Exam:  GEN:  Pleasant Elderly Obese  79 y.o.  Caucasian female examined  and in no acute distress; cooperative with exam Filed Vitals:   02/20/14 0545 02/20/14 0600 02/20/14 0615 02/20/14 0618  BP: 184/85 196/90 192/90   Pulse: 96 98 93   Temp:      TempSrc:      Resp: 26 26 20    SpO2: 100% 100% 100% 100%   Blood pressure 192/90, pulse 93, temperature 98.2 F (36.8 C), temperature source Oral, resp. rate 20, SpO2 100.00%. PSYCH: She is alert and oriented x4; does not appear anxious does not appear depressed; affect is normal HEENT: Normocephalic and Atraumatic, Mucous membranes pink; PERRLA; EOM intact; Fundi:  Benign;  No scleral icterus, Nares: Patent, Oropharynx: Clear,     Neck:  FROM, No Cervical Lymphadenopathy nor Thyromegaly or Carotid Bruit; No JVD; Breasts:: Not examined CHEST WALL: No tenderness CHEST: Decreased Breath sounds with bibasilar Wheezes,    HEART: Regular rate and rhythm; no murmurs rubs or gallops BACK: No kyphosis or scoliosis; No CVA tenderness ABDOMEN: Positive Bowel Sounds, Obese, Soft Non-Tender; No Masses, No Organomegaly, No Pannus; No Intertriginous candida. Rectal Exam: Not done EXTREMITIES: No Cyanosis, Clubbing, 3+ EDEMA to BLEs,  No Ulcerations. Genitalia: not examined PULSES: 2+ and symmetric SKIN: Normal hydration no rash or ulceration CNS: Alert and Oriented x4 , No Focal   Vascular: pulses palpable throughout    Labs on Admission:  Basic Metabolic Panel:  Recent Labs Lab 02/20/14 0434  NA 146  K 4.3  CL 104  CO2 27  GLUCOSE 208*  BUN 46*  CREATININE  3.15*  CALCIUM 7.7*   Liver Function Tests: No results found for this basename: AST, ALT, ALKPHOS, BILITOT, PROT, ALBUMIN,  in the last 168 hours No results found for this basename: LIPASE, AMYLASE,  in the last 168 hours No results found for this basename: AMMONIA,  in the last 168 hours CBC:  Recent Labs Lab 02/20/14 0434  WBC 12.0*  HGB 11.2*  HCT 37.2  MCV 84.0  PLT 267   Cardiac Enzymes: No results found for this basename: CKTOTAL, CKMB, CKMBINDEX, TROPONINI,  in the last 168 hours  BNP (last 3 results)  Recent Labs  02/20/14 0434  PROBNP 34048.0*  CBG: No results found for this basename: GLUCAP,  in the last 168 hours  Radiological Exams on Admission: Dg Chest Port 1 View  02/20/2014   CLINICAL DATA:  Shortness of breath and chest pain  EXAM: PORTABLE CHEST - 1 VIEW  COMPARISON:  08/25/2012  FINDINGS: Technically limited study due to patient positioning. Shallow inspiration. Heart size and pulmonary vascularity appear increased. Diffuse interstitial changes in the lungs. No focal consolidation. No blunting of costophrenic angles. No pneumothorax.  IMPRESSION: Cardiac enlargement with mild vascular congestion and interstitial edema.   Electronically Signed   By: Lucienne Capers M.D.   On: 02/20/2014 04:51     EKG: Independently reviewed. Sinus Tachycardia at 113   Assessment/Plan:   79 y.o. female with  Principal Problem:   1.    Diastolic CHF, acute on chronic   Placed on the Acute CHF Protocol   Diurese with IV Lasix   IV NTG Drip   2D ECHO in AM   Cycle Troponins   Active Problems:   2.   HYPERTENSION, BENIGN ESSENTIAL, LABILE     3.   CAD/ Chest Pain   IV NTG drip, ASA   Cycle Troponins   Cardiac Monitoring     4.   Renal dysfunction- Gd 4   Monitor BUN/C5r   No Ace Inhibitor due to Renal Insufficency    5.   Chronic obstructive asthma, unspecified   Monitor O2 sats   continue Symbicort     6.    DVT  Prophylaxis   Lovenox       Code Status:   FULL CODE   Family Communication:    Son and Grandson at Bedside Disposition Plan:      Inpatient   Time spent:  [place] [facility] Pager [phone]   If [place] Please Contact the Day Rounding Team MD for [facility]  If 7PM-7AM, Please Contact night-coverage  [email or web address] Password [facility] 02/20/2014, 6:43 AM

## 2014-02-20 NOTE — ED Notes (Signed)
Sob and chest pain; Extremely diaphoretic on scene. RA sao2 88%; 100% NRB 94-96%; pt. Tried to take her ntg but it was expired. ems gave 324 mg asa and ntg.

## 2014-02-20 NOTE — Progress Notes (Signed)
Utilization Review Completed.   Mahlet Jergens, RN, BSN Nurse Case Manager  

## 2014-02-20 NOTE — Progress Notes (Signed)
ANTICOAGULATION CONSULT NOTE - Initial Consult  Pharmacy Consult for heparin Indication: chest pain/ACS  No Known Allergies  Patient Measurements:   Heparin Dosing Weight: 70.5 kg  Vital Signs: Temp: 98.2 F (36.8 C) (09/06 0440) Temp src: Oral (09/06 0440) BP: 154/63 mmHg (09/06 0830) Pulse Rate: 80 (09/06 0830)  Labs:  Recent Labs  02/20/14 0434 02/20/14 0715  HGB 11.2*  --   HCT 37.2  --   PLT 267  --   CREATININE 3.15*  --   TROPONINI  --  2.62*    The CrCl is unknown because both a height and weight (above a minimum accepted value) are required for this calculation.   Medical History: Past Medical History  Diagnosis Date  . Myocardial infarction   . Hypertension   . Chronic combined systolic and diastolic CHF (congestive heart failure)     a. EF 45-50% in 9833. b. Mixed systolic/diastolic - EF 82-50% by echo 2014, 35% by nuc.  Marland Kitchen Anemia   . Stroke     a. 2008: acute stroke in left centrum ovale. 06/2010: hemorrhagic stroke.  Marland Kitchen GERD (gastroesophageal reflux disease)   . Headache(784.0)   . Arthritis   . Depression   . CKD (chronic kidney disease), stage IV 08/29/2012  . Cancer     scalp  . Nephrolithiasis   . Hx of peptic ulcer   . Osteoarthritis, generalized   . Gastrointestinal hemorrhage     a. Pt reports she has h/o this many years ago, requiring 2 units of blood. Thinks it was in [place] but records not available. She does not know why this happened.  Marland Kitchen CAD (coronary artery disease)   . H/O medication noncompliance     a. Per notes, due to cost.   . CAD (coronary artery disease)     a. Stents in [facility] 2001 and cath 2008 with possible balloon or stent at [facility] (not available in [place]).   . Hemorrhoid     a. Mild hemorrhoidal bleeding per previous DC note.  Marland Kitchen COPD (chronic obstructive pulmonary disease)     Medications:  See med history  Assessment: 79 yo lady admitted with SOB.  Her troponin is elevated and she will start heparin.   Baseline labs as above.  She was not on anticoagulation PTA. Goal of Therapy:  Heparin level 0.3-0.7 units/ml Monitor platelets by anticoagulation protocol: Yes   Plan:  Heparin bolus 3500 units and drip at 1100 units/hr Check heparin level 8 hours after bolus Daily HL and CBC while on heparin  Thanks for allowing pharmacy to be a part of this patient's care.  Excell Seltzer, PharmD Clinical Pharmacist, [phone] 02/20/2014,9:28 AM

## 2014-02-20 NOTE — ED Notes (Signed)
Abnormal labs given to Dr. Lita Mains

## 2014-02-20 NOTE — ED Notes (Signed)
Spoke with Dr. Wendee Beavers, admitting. Made aware of troponin.

## 2014-02-20 NOTE — ED Notes (Signed)
EDP reports Dr. Rayann Heman with Cardiology coming down to see patient. Trying to get in touch with admitting MD to make aware of troponin.

## 2014-02-20 NOTE — Plan of Care (Signed)
Problem: Consults Goal: Chest Pain Patient Education (See Patient Education module for education specifics.) Outcome: Progressing Pt will call stat re chest pain,pressure,tightness,SOB or nausea

## 2014-02-20 NOTE — Consult Note (Signed)
Cardiology Consultation Note  Patient ID: [name], MRN: [id number], DOB/AGE: 1934-11-19 79 y.o. Admit date: 02/20/2014   Date of Consult: 02/20/2014 Primary Physician: Adella Hare, MD Primary Cardiologist: Mare Ferrari saw as inpatient 08/2012  Chief Complaint: SOB, CP Reason for Consult: elevated troponin, CHF, worsened HTN, AKI on CKD   HPI: [name] is a 79 y/o F with history of CKD stage IV, COPD, hypertensive heart disease with chronic combined CHF (EF 40-45% in 08/2012 with EF 35% by nuc), CAD (stent in [facility] 2001, cath in 2008 with possible balloon or stent), strokes (including hemorrhagic in 2012), prior GIB requiring transfusion (details unclear), prior medication noncompliance due to cost who presented to [facility] with dyspnea, chest tightness, and accelerated hypertension in the setting of running out of some of her medicines. From a cardiology standpoint, she was seen by Dr. Mare Ferrari in the hospital 08/2012 for increasing SOB at which time nuclear stress test showed no evidence of ischemia; + large inferior/apical scar, EF 35%. Her medical therapy regimen was optimized as she was not felt to be a cath candidate secondary to poor renal function. She had been instructed to f/u PCP following that admission, who noted in followup that she had not started some of her post-hosp meds at that time.  She is not a great historian in terms of providing specific details. She has felt poorly on/off for the last year with chronic waxing/waning LEE and dyspnea. Last night around 9pm she developed chest tightness and some nausea/vomiting and orthopnea. This came and went throughout the night. She came to the ER early this morning because she couldn't catch her breath. EMS was called who found her diaphoretic and hypoxic 99% RA requiring NRB then [place]. She was markedly hypertensive up to a peak of 026V systolic and 785Y diastolic. She brings in her pill bottles today and Norvasc and Enalpril  were empty. She said after her PCP retired she was unable to get them refilled so hasn't taken them in a while. She feels it is "bull" that continuing to smoke will lead to worsening problems as she does not believe this has any effect on her health. In the ED, CXR c/w cardiac enlargement and interstitial edema. WBC 12k, pBNP 34k, BUN/Cr 46/3.15 (last 08/2012: Cr 2.79). Received 80mg  IV Lasix, 10mg  IV hydralazine, 2mg  morphine, and started on a NTG gtt with improvement in BP currently to 154/63. She has put out ~700 cc urine so far and is feeling somewhat better. Chest discomfort still comes and goes but currently pain free. Troponin 0.04->0.94 (POC)->2.62 (troponin I done near same time as POC). EMS gave her 324mg  ASA on arrival per report.  Past Medical History  Diagnosis Date  . Myocardial infarction   . Hypertension   . Chronic combined systolic and diastolic CHF (congestive heart failure)     a. EF 45-50% in 8502. b. Mixed systolic/diastolic - EF 77-41% by echo 2014, 35% by nuc.  Marland Kitchen Anemia   . Stroke     a. 2008: acute stroke in left centrum ovale. 06/2010: hemorrhagic stroke.  Marland Kitchen GERD (gastroesophageal reflux disease)   . Headache(784.0)   . Arthritis   . Depression   . CKD (chronic kidney disease), stage IV 08/29/2012  . Cancer     scalp  . Nephrolithiasis   . Hx of peptic ulcer   . Osteoarthritis, generalized   . Gastrointestinal hemorrhage     a. Pt reports she has h/o this many years ago,  requiring 2 units of blood. Thinks it was in [place] but records not available. She does not know why this happened.  Marland Kitchen CAD (coronary artery disease)   . H/O medication noncompliance     a. Per notes, due to cost.   . CAD (coronary artery disease)     a. Stents in [facility] 2001 and cath 2008 with possible balloon or stent at [facility] (not available in [place]).   . Hemorrhoid     a. Mild hemorrhoidal bleeding per previous DC note.  Marland Kitchen COPD (chronic obstructive pulmonary disease)       Most  Recent Cardiac Studies: 08/2012 Nuc IMPRESSION:  1. No evidence of pharmacologic induced myocardial ischemia.  2. Large myocardial scar involving the inferior and apical walls.  3. Global hypokinesis with greatest involvement of the inferior  and septal walls.  4. Calculated left hip ejection fraction is 35%.  2D Echo 08/2012 - Procedure narrative: Transthoracic echocardiography. Image quality was adequate. The study was technically difficult, as a result of poor acoustic windows and poor sound wave transmission. - Left ventricle: The cavity size was normal. There was severe concentric hypertrophy. Systolic function was mildly to moderately reduced. The estimated ejection fraction was in the range of 40% to 45%. Incoordinate septal motion. Inferolateral hypokinesis. Doppler parameters are consistent with abnormal left ventricular relaxation (grade 1 diastolic dysfunction). The E/e' ratio is ~10, suggesting borderline increased LV filling pressure. - Left atrium: Moderately dilated (39 ml/m2). - Inferior vena cava: The vessel was normal in size; the respirophasic diameter changes were in the normal range (= 50%); findings are consistent with normal central venous pressure.   Surgical History:  Past Surgical History  Procedure Laterality Date  . Appendectomy    . Tonsillectomy    . Abdominal hysterectomy    . Cholecystectomy    . Eye surgery    . Colon surgery    . Colonoscopy  07/18/10  . Melanoma excision  1989    distal right LE      Home Meds: Prior to Admission medications   Medication Sig Start Date End Date Taking? Authorizing Provider  amLODipine (NORVASC) 5 MG tablet Take 1 by mouth daily 10/19/12  Yes Neena Rhymes, MD  aspirin 81 MG chewable tablet Chew 1 tablet (81 mg total) by mouth daily. 09/02/12  Yes Neena Rhymes, MD  atorvastatin (LIPITOR) 20 MG tablet Take 1 tablet (20 mg total) by mouth daily at 6 PM. 09/02/12  Yes Neena Rhymes, MD    budesonide-formoterol [facility]) 80-4.5 MCG/ACT inhaler Inhale 2 puffs into the lungs 2 (two) times daily as needed (shortness of breath).   Yes Historical Provider, MD  carvedilol (COREG) 6.25 MG tablet Take 1 tablet (6.25 mg total) by mouth 2 (two) times daily with a meal. 09/15/12  Yes Neena Rhymes, MD  enalapril (VASOTEC) 5 MG tablet Take 5 mg by mouth daily.   Yes Historical Provider, MD  furosemide (LASIX) 40 MG tablet Take 40 mg by mouth 2 (two) times daily.   Yes Historical Provider, MD  isosorbide dinitrate (ISORDIL) 30 MG tablet Take 1 tablet (30 mg total) by mouth 3 (three) times daily. 09/02/12  Yes Neena Rhymes, MD  ranolazine (RANEXA) 500 MG 12 hr tablet Take 1 tablet (500 mg total) by mouth 2 (two) times daily. 09/02/12  Yes Neena Rhymes, MD    Inpatient Medications:    . nitroGLYCERIN      Allergies: No Known Allergies  History  Social History  . Marital Status: Divorced    Spouse Name: N/A    Number of Children: N/A  . Years of Education: N/A   Occupational History  . Not on file.   Social History Main Topics  . Smoking status: Current Every Day Smoker    Types: Cigarettes  . Smokeless tobacco: Never Used  . Alcohol Use: No  . Drug Use: No  . Sexual Activity: Not on file   Other Topics Concern  . Not on file   Social History Narrative   Graduate [employer]   Married '59-30 yrs/divorced   2 sons, '61 '62; 8 grandchildren   Works as Optometrist, Field seismologist   Retired-lives in her own home and her son lives with her.      Family History  Problem Relation Age of Onset  . Diabetes Mother   . Hypertension Mother   . Diabetes Sister   . Cancer Son     breast  . Diabetes Sister      Review of Systems: General: negative for chills, fever, night sweats or weight changes Cardiovascular: no palpitations Urologic: negative for hematuria Abdominal: negative for diarrhea, melena, or hematemesis. Rare BRBPR which she attributes to  her hemorrhoids.  Neurologic: negative for syncope All other systems reviewed and are otherwise negative except as noted above.  Labs:  Recent Labs  02/20/14 0715  TROPONINI 2.62*   Lab Results  Component Value Date   WBC 12.0* 02/20/2014   HGB 11.2* 02/20/2014   HCT 37.2 02/20/2014   MCV 84.0 02/20/2014   PLT 267 02/20/2014     Recent Labs Lab 02/20/14 0434  NA 146  K 4.3  CL 104  CO2 27  BUN 46*  CREATININE 3.15*  CALCIUM 7.7*  GLUCOSE 208*   Lab Results  Component Value Date   CHOL 115 08/27/2012   HDL 54 08/27/2012   LDLCALC 36 08/27/2012   TRIG 126 08/27/2012   Radiology/Studies:  Dg Chest Port 1 View 02/20/2014   CLINICAL DATA:  Shortness of breath and chest pain  EXAM: PORTABLE CHEST - 1 VIEW  COMPARISON:  08/25/2012  FINDINGS: Technically limited study due to patient positioning. Shallow inspiration. Heart size and pulmonary vascularity appear increased. Diffuse interstitial changes in the lungs. No focal consolidation. No blunting of costophrenic angles. No pneumothorax.  IMPRESSION: Cardiac enlargement with mild vascular congestion and interstitial edema.   Electronically Signed   By: Lucienne Capers M.D.   On: 02/20/2014 04:51   EKG:  9/6 4:34: sinus tach 113bpm, nonspecific IVCD,anteroseptal infarct age indeterminate, TWI avL, V5-V6; mild ST depression in I & possibly inferiorly (baseline wander present), more pronounced ST depression in V5-V6 9/6 8:01: NSR, nonspecific IVCD ? LBBB, TWI I, avL, V4-V6; mild ST depression II and more pronounced in V4-V6 with otherwise nonspecific ST-T changes throughout  Prior EKG in 2014 had NSR with TWI I, avL, V5-V6 with associated ST sagging in V5-V6  Physical Exam: Blood pressure 154/63, pulse 80, temperature 98.2 F (36.8 C), temperature source Oral, resp. rate 19, SpO2 97.00%. General: Chronically ill appearing somewhat dissheveled WF in no acute distress. Head: Normocephalic, atraumatic, sclera non-icteric, no xanthomas, nares  are without discharge.  Neck: Negative for carotid bruits. JVD not elevated. Lungs: Decreased BS at bases L>R, otherwise coarse, without wheezes or rhonchi. Breathing is unlabored. Heart: RRR with S1 S2. No murmurs, rubs, or gallops appreciated. Abdomen: Soft, non-tender, non-distended with normoactive bowel sounds. No hepatomegaly. No rebound/guarding. No obvious abdominal masses.  Msk:  Tone appears normal for age. Extremities: No clubbing or cyanosis. 1-2+ BLE edema with discoloration to lower anterior right leg.  Distal pedal pulses are in tact. Neuro: Alert and oriented X 3. No facial asymmetry. No focal deficit. Moves all extremities spontaneously. Psych:  Responds to questions appropriately with a somewhat slowed affect.   Assessment and Plan:   1. Acute respiratory failure 2. Acute on chronic combined systolic and diastolic CHF 3. Accelerated HTN 4. Elevated troponin, question NSTEMI vs demand ischemia 5. AKI on CKD stage IV 6. H/o medication noncompliance 7. Ongoing tobacco abuse - patient recalcitrant to believe this affects her health despite counseling  Suspect CHF was driven by uncontrolled HTN in the setting of being out of Norvasc/Enalapril for quite some time. This, along with her respiratory status in the setting of underlying CAD, is likely the driver for her elevated troponin. Unfortunately in the setting of worsened renal function, prior hemorrhagic stroke and GIB, she is not a good candidate for cath at this time. She is not currently having chest pain now that her BP is lower and she is starting to diurese, so continued medical therapy may be the best strategy here. We agree with continuation of Coreg and Ranexa, along with resumption of Amlodipine.  Agree with IV Lasix. Follow renal function closely. Continue aspirin 81mg  daily. Will stop Imdur while she is on NTG gtt and consider switching over tomorrow. Will d/c Lovenox DVT ppx dosing and change to heparin per pharmacy  while we trend troponins. Cycle troponins and check echo. She has care management consult pending and will also ask [facility] CM to screen.  Signed, Dayna Dunn PA-C 02/20/2014, 8:57 AM  I have seen, examined the patient, and reviewed the above assessment and plan.  Changes to above are made where necessary.  The patient has poor insight into her disease.  Compliance with lifestyle modification, smoking cessation, and medicine adherence were discussed today.  She is clinically improving with initiation of IV nitroglycerine and diuresis.  Will resume home medicines and adjust accordingly.  She is not presently a candidate for cath given renal failure, prior bleeding difficulty, compliance etc.    Co Sign: Thompson Grayer, MD 02/20/2014 9:55 AM

## 2014-02-20 NOTE — Progress Notes (Signed)
Patient seen and evaluated by my associate earlier this AM.  Cardiology has been consulted and will plan on following up this AM. Troponin elevated per nursing report. Per nurse patient currently was not complaining of chest discomfort and was sleeping. Currently on IV NTG drip.  Awaiting evaluation from cardiology.  Will reassess next am.  Velvet Bathe

## 2014-02-20 NOTE — Progress Notes (Signed)
  Echocardiogram 2D Echocardiogram has been performed.  [name] 02/20/2014, 2:11 PM

## 2014-02-21 DIAGNOSIS — I10 Essential (primary) hypertension: Secondary | ICD-10-CM

## 2014-02-21 LAB — HEPARIN LEVEL (UNFRACTIONATED)
Heparin Unfractionated: 0.15 IU/mL — ABNORMAL LOW (ref 0.30–0.70)
Heparin Unfractionated: 0.37 IU/mL (ref 0.30–0.70)
Heparin Unfractionated: 0.54 IU/mL (ref 0.30–0.70)

## 2014-02-21 LAB — BASIC METABOLIC PANEL
Anion gap: 11 (ref 5–15)
BUN: 45 mg/dL — AB (ref 6–23)
CO2: 31 mEq/L (ref 19–32)
Calcium: 7.3 mg/dL — ABNORMAL LOW (ref 8.4–10.5)
Chloride: 101 mEq/L (ref 96–112)
Creatinine, Ser: 3.3 mg/dL — ABNORMAL HIGH (ref 0.50–1.10)
GFR calc Af Amer: 14 mL/min — ABNORMAL LOW (ref >90)
GFR calc non Af Amer: 12 mL/min — ABNORMAL LOW (ref >90)
GLUCOSE: 107 mg/dL — AB (ref 70–99)
Potassium: 4.2 mEq/L (ref 3.7–5.3)
Sodium: 143 mEq/L (ref 137–147)

## 2014-02-21 LAB — CBC
HCT: 33.5 % — ABNORMAL LOW (ref 36.0–46.0)
HEMOGLOBIN: 10.2 g/dL — AB (ref 12.0–15.0)
MCH: 24.9 pg — AB (ref 26.0–34.0)
MCHC: 30.4 g/dL (ref 30.0–36.0)
MCV: 81.7 fL (ref 78.0–100.0)
Platelets: 260 10*3/uL (ref 150–400)
RBC: 4.1 MIL/uL (ref 3.87–5.11)
RDW: 16.7 % — ABNORMAL HIGH (ref 11.5–15.5)
WBC: 8.5 10*3/uL (ref 4.0–10.5)

## 2014-02-21 MED ORDER — HYDRALAZINE HCL 10 MG PO TABS
10.0000 mg | ORAL_TABLET | Freq: Three times a day (TID) | ORAL | Status: DC
  Administered 2014-02-21 – 2014-02-22 (×4): 10 mg via ORAL
  Filled 2014-02-21 (×7): qty 1

## 2014-02-21 MED ORDER — CETYLPYRIDINIUM CHLORIDE 0.05 % MT LIQD
7.0000 mL | Freq: Two times a day (BID) | OROMUCOSAL | Status: DC
  Administered 2014-02-21 – 2014-02-24 (×7): 7 mL via OROMUCOSAL

## 2014-02-21 MED ORDER — HEPARIN BOLUS VIA INFUSION
2000.0000 [IU] | Freq: Once | INTRAVENOUS | Status: AC
  Administered 2014-02-21: 2000 [IU] via INTRAVENOUS
  Filled 2014-02-21: qty 2000

## 2014-02-21 NOTE — Progress Notes (Signed)
PROGRESS NOTE  Cardiologist:  Mare Ferrari  Subjective:   [name] is a 79 y/o F with history of CKD stage IV, COPD, hypertensive heart disease with chronic combined CHF (EF 40-45% in 08/2012 with EF 35% by nuc), CAD (stent in [facility] 2001, cath in 2008 with possible balloon or stent), strokes (including hemorrhagic in 2012), prior GIB requiring transfusion (details unclear), prior medication noncompliance due to cost who presented to [facility] with dyspnea, chest tightness, and accelerated hypertension in the setting of running out of some of her medicines.  She is feeling a bit better. Troponins remain elevated.   Objective:    Vital Signs:   Temp:  [97.5 F (36.4 C)-98.6 F (37 C)] 98.6 F (37 C) (09/07 0300) Pulse Rate:  [70-90] 74 (09/07 0700) Resp:  [15-35] 24 (09/07 0700) BP: (113-208)/(47-88) 170/62 mmHg (09/07 0700) SpO2:  [96 %-100 %] 98 % (09/07 0700) Weight:  [188 lb 11.4 oz (85.6 kg)-189 lb 6 oz (85.9 kg)] 188 lb 11.4 oz (85.6 kg) (09/07 0500)      24-hour weight change: Weight change:   Weight trends: Filed Weights   02/20/14 1030 02/21/14 0500  Weight: 189 lb 6 oz (85.9 kg) 188 lb 11.4 oz (85.6 kg)    Intake/Output:  09/06 0701 - 09/07 0700 In: 958.5 [P.O.:300; I.V.:658.5] Out: 3850 [Urine:3850]     Physical Exam: BP 170/62  Pulse 74  Temp(Src) 98.6 F (37 C) (Oral)  Resp 24  Ht 5\' 4"  (1.626 m)  Wt 188 lb 11.4 oz (85.6 kg)  BMI 32.38 kg/m2  SpO2 98%  Wt Readings from Last 3 Encounters:  02/21/14 188 lb 11.4 oz (85.6 kg)  09/09/12 211 lb (95.709 kg)  09/02/12 217 lb 13 oz (98.8 kg)    General: Vital signs reviewed and noted.   Head: Normocephalic, atraumatic.  Eyes: conjunctivae/corneas clear.  EOM's intact.   Throat: normal  Neck:    Lungs:    decreased breath sounds bilaterally   Heart:  RR, normal S1S2  Abdomen:  Soft, non-tender, non-distended    Extremities: 1+ edema    Neurologic: A&O X3, CN II - XII are grossly  intact.   Psych: Normal     Labs: BMET:  Recent Labs  02/20/14 0434 02/21/14 0210  NA 146 143  K 4.3 4.2  CL 104 101  CO2 27 31  GLUCOSE 208* 107*  BUN 46* 45*  CREATININE 3.15* 3.30*  CALCIUM 7.7* 7.3*    Liver function tests:  Recent Labs  02/20/14 1018  AST 20  ALT 8  ALKPHOS 63  BILITOT 0.2*  PROT 6.6  ALBUMIN 3.2*   No results found for this basename: LIPASE, AMYLASE,  in the last 72 hours  CBC:  Recent Labs  02/20/14 0434 02/21/14 0210  WBC 12.0* 8.5  HGB 11.2* 10.2*  HCT 37.2 33.5*  MCV 84.0 81.7  PLT 267 260    Cardiac Enzymes:  Recent Labs  02/20/14 0715 02/20/14 1240 02/20/14 1831  TROPONINI 2.62* 6.24* 6.11*    Coagulation Studies: No results found for this basename: LABPROT, INR,  in the last 72 hours   Recent Labs  02/20/14 1018  TSH 1.420   No results found for this basename: VITAMINB12, FOLATE, FERRITIN, TIBC, IRON, RETICCTPCT,  in the last 72 hours   Other results:  EKG : 02/20/14:  NSR, NS IVCD, ST depression in the lateral leads.  ECG today pending.   Medications:  Infusions: . heparin 1,450 Units/hr (02/21/14 0356)  . nitroGLYCERIN 30 mcg/min (02/21/14 0300)    Scheduled Medications: . amLODipine  5 mg Oral Daily  . aspirin  81 mg Oral Daily  . atorvastatin  20 mg Oral q1800  . carvedilol  6.25 mg Oral BID WC  . furosemide  80 mg Intravenous Q12H  . ranolazine  500 mg Oral BID  . sodium chloride  3 mL Intravenous Q12H    Assessment/ Plan:      1.  Diastolic CHF, acute on chronic:   This is largely due to her medication noncompliance and her renal insufficiency.   She may also have CAD - although a myoview i March 2015 did not show ischemia.  She has continued to smoke and may have developed  Worsening CAD.  I discussed the relationship between the heart and the kidneys and how fluid is regulated.  Will continue with medical therapy I discussed that she may need to go on dialysis at some point and she  stated that she had no interest in going on dialysis.  2. NSTEMI , type II:   Possible etiologies include worsening CHF  Vs. Worsening CAD.  She had a myoview in March 2015 which did not show any ischemia but she has continued to smoke and may have developed a significant lesion. I explained that we typically would want to do a cardiac cath to further evaluate her elevated Troponins but that we think that a cath may lead to renal failure.  She does not want to go on dialysis.   Continue meds.  Nurse asked about Ranexa in the setting of her CKD.  According to [employer], Ranexa is metabolized in the liver but the metabolites are excreated in urine (75%) and stool ( 25%).  She will ask pharmacy to comment on this.     3.    HYPERTENSION,  She needs additional medications.   Will add scheduled hydralazine 10 TID and titrate as needed.   Will consider changing IV NTG to Imdur tomorrow.      4.  Chronic obstructive asthma, unspecified      Disposition:  Length of Stay: 1  [name], Sarah Bonito., MD, [facility] 02/21/2014, 7:43 AM Office [phone] Pager [phone]

## 2014-02-21 NOTE — Progress Notes (Signed)
PHARMACY NOTE  Pharmacy Consult :  79 y.o. female is currently on Heparin for Chest pain/ACS.   Dosing Wt :  70.5 kg  Hematology :  Recent Labs  02/20/14 0434 02/20/14 1831 02/21/14 0210 02/21/14 1130 02/21/14 2005  HGB 11.2*  --  10.2*  --   --   HCT 37.2  --  33.5*  --   --   PLT 267  --  260  --   --   HEPARINUNFRC  --  0.12* 0.15* 0.37 0.54  CREATININE 3.15*  --  3.30*  --   --     Current Medication[s] Include: Infusion[s]: Infusions:  . heparin 1,450 Units/hr (02/21/14 0800)  . nitroGLYCERIN 40 mcg/min (02/21/14 1819)    Assessment :  Heparin level 0.54 units/ml on Heparin infusing at 1450 units/hr.  No evidence of bleeding complications observed.  Goal :  Heparin level 0.3-0.7 units/ml.  Plan : 1. Heparin will be continued at the same rate.   The next Heparin Level will be drawn with the AM labs. 2. Daily Heparin level, CBC while on Heparin.  Monitor for bleeding complications. Follow Platelet counts.  Corneshia Hines, Craig Guess,  Pharm.D  02/21/2014  8:51 PM

## 2014-02-21 NOTE — Progress Notes (Signed)
[facility] PROGRESS NOTE  [name] XHB:[id number] DOB: 1934/09/26 DOA: 02/20/2014 PCP: Adella Hare, MD  Brief Narrative:  [name] is a 79 y/o F with history of COPD,  CKD stage IV, hypertensive heart disease with chronic combined CHF (EF 40-45% in 08/2012 with EF 35% by nuc), CAD (stent in [facility] 2001, cath in 2008 with possible balloon or stent), strokes (including hemorrhagic in 2012), prior GIB requiring transfusion (details unclear), prior medication noncompliance due to cost who presented to [facility] with dyspnea, chest tightness, and accelerated hypertension in the setting of running out of some of her medicines.   Assessment/Plan: Principal Problem:   NSTEMI type II - Cardiology managing - Currently no plans for cath given Renal function per Cardiology notes.    Diastolic CHF, acute on chronic - Cardiology managing.  Active Problems:   HYPERTENSION, BENIGN ESSENTIAL, LABILE - Pt on carvedilol and amlodipine. Will consider increasing amlodipine dose with continued elevated BP readings.    CAD -currently on aspirin and statine    Renal dysfunction- Gd 4 - Will monitor S creatinine - avoid ACEI    Chronic obstructive asthma, unspecified - stable continue symbicort  Code Status: full Family Communication: None at bedside Disposition Plan: Pending recommendations from cardiologist   Consultants:  Cardiology  Antibiotics:  None  HPI/Subjective: Pt has no new complaints. No acute issues reported overnight.  Objective: Filed Vitals:   02/21/14 1000  BP: 175/46  Jarvis: 65  Temp:   Resp: 16    Intake/Output Summary (Last 24 hours) at 02/21/14 1118 Last data filed at 02/21/14 1000  Gross per 24 hour  Intake 1759.5 ml  Output   3200 ml  Net -1440.5 ml   Filed Weights   02/20/14 1030 02/21/14 0500  Weight: 85.9 kg (189 lb 6 oz) 85.6 kg (188 lb 11.4 oz)    Exam:   General:  Pt in nad, alert and awake  Cardiovascular:  rrr, no rubs  Respiratory: cta bl, no wheezes  Abdomen: soft, NT, ND  Musculoskeletal: no cyanosis or clubbing   Data Reviewed: Basic Metabolic Panel:  Recent Labs Lab 02/20/14 0434 02/21/14 0210  NA 146 143  K 4.3 4.2  CL 104 101  CO2 27 31  GLUCOSE 208* 107*  BUN 46* 45*  CREATININE 3.15* 3.30*  CALCIUM 7.7* 7.3*   Liver Function Tests:  Recent Labs Lab 02/20/14 1018  AST 20  ALT 8  ALKPHOS 63  BILITOT 0.2*  PROT 6.6  ALBUMIN 3.2*   No results found for this basename: LIPASE, AMYLASE,  in the last 168 hours No results found for this basename: AMMONIA,  in the last 168 hours CBC:  Recent Labs Lab 02/20/14 0434 02/21/14 0210  WBC 12.0* 8.5  HGB 11.2* 10.2*  HCT 37.2 33.5*  MCV 84.0 81.7  PLT 267 260   Cardiac Enzymes:  Recent Labs Lab 02/20/14 0715 02/20/14 1240 02/20/14 1831  TROPONINI 2.62* 6.24* 6.11*   BNP (last 3 results)  Recent Labs  02/20/14 0434  PROBNP 34048.0*   CBG: No results found for this basename: GLUCAP,  in the last 168 hours  Recent Results (from the past 240 hour(s))  MRSA PCR SCREENING     Status: None   Collection Time    02/20/14 10:25 AM      Result Value Ref Range Status   MRSA by PCR NEGATIVE  NEGATIVE Final   Comment:  The GeneXpert MRSA Assay (FDA     approved for NASAL specimens     only), is one component of a     comprehensive MRSA colonization     surveillance program. It is not     intended to diagnose MRSA     infection nor to guide or     monitor treatment for     MRSA infections.     Studies: Dg Chest Port 1 View  02/20/2014   CLINICAL DATA:  Shortness of breath and chest pain  EXAM: PORTABLE CHEST - 1 VIEW  COMPARISON:  08/25/2012  FINDINGS: Technically limited study due to patient positioning. Shallow inspiration. Heart size and pulmonary vascularity appear increased. Diffuse interstitial changes in the lungs. No focal consolidation. No blunting of costophrenic angles. No  pneumothorax.  IMPRESSION: Cardiac enlargement with mild vascular congestion and interstitial edema.   Electronically Signed   By: Lucienne Capers M.D.   On: 02/20/2014 04:51    Scheduled Meds: . amLODipine  5 mg Oral Daily  . antiseptic oral rinse  7 mL Mouth Rinse BID  . aspirin  81 mg Oral Daily  . atorvastatin  20 mg Oral q1800  . carvedilol  6.25 mg Oral BID WC  . furosemide  80 mg Intravenous Q12H  . hydrALAZINE  10 mg Oral 3 times per day  . ranolazine  500 mg Oral BID  . sodium chloride  3 mL Intravenous Q12H   Continuous Infusions: . heparin 1,450 Units/hr (02/21/14 0800)  . nitroGLYCERIN 40 mcg/min (02/21/14 0800)    Time spent: > 35 minutes    Velvet Bathe  [facility] Pager [phone] If 7PM-7AM, please contact night-coverage at [email or web address], password [facility] 02/21/2014, 11:18 AM  LOS: 1 day

## 2014-02-21 NOTE — Progress Notes (Signed)
Pharmacist Heart Failure Core Measure Documentation  Assessment: [name] has an EF documented as 25-30% on 9/6 by ECHO.  Rationale: Heart failure patients with left ventricular systolic dysfunction (LVSD) and an EF < 40% should be prescribed an angiotensin converting enzyme inhibitor (ACEI) or angiotensin receptor blocker (ARB) at discharge unless a contraindication is documented in the medical record.  This patient is not currently on an ACEI or ARB for HF.  This note is being placed in the record in order to provide documentation that a contraindication to the use of these agents is present for this encounter.  ACE Inhibitor or Angiotensin Receptor Blocker is contraindicated (specify all that apply)  []   ACEI allergy AND ARB allergy []   Angioedema []   Moderate or severe aortic stenosis []   Hyperkalemia []   Hypotension []   Renal artery stenosis [x]   Worsening renal function, preexisting renal disease or dysfunction   [name] 02/21/2014 3:01 PM

## 2014-02-21 NOTE — Progress Notes (Signed)
Bipap order was an ER order.  Rt will continue to monitor. Pt.

## 2014-02-21 NOTE — Progress Notes (Signed)
ANTICOAGULATION CONSULT NOTE Pharmacy Consult for heparin Indication: chest pain/ACS  No Known Allergies  Patient Measurements: Height: 5\' 4"  (162.6 cm) Weight: 189 lb 6 oz (85.9 kg) IBW/kg (Calculated) : 54.7 Heparin Dosing Weight: 70.5 kg  Vital Signs: Temp: 98 F (36.7 C) (09/06 2356) Temp src: Oral (09/06 2356) BP: 161/71 mmHg (09/07 0100) Pulse Rate: 73 (09/07 0100)  Labs:  Recent Labs  02/20/14 0434 02/20/14 0715 02/20/14 1240 02/20/14 1831 02/21/14 0210  HGB 11.2*  --   --   --  10.2*  HCT 37.2  --   --   --  33.5*  PLT 267  --   --   --  260  HEPARINUNFRC  --   --   --  0.12* 0.15*  CREATININE 3.15*  --   --   --  3.30*  TROPONINI  --  2.62* 6.24* 6.11*  --     Estimated Creatinine Clearance: 15.1 ml/min (by C-G formula based on Cr of 3.3).  Assessment: 79 yo lady with SOB, elevated cardiac markers, for heparin  Goal of Therapy:  Heparin level 0.3-0.7 units/ml Monitor platelets by anticoagulation protocol: Yes   Plan:  Heparin 2000 units IV bolus, then increase heparin 1450 units/hr Check heparin level in 8 hours.   Phillis Knack, PharmD, BCPS

## 2014-02-21 NOTE — Progress Notes (Signed)
ANTICOAGULATION CONSULT NOTE - Follow Up  Pharmacy Consult for heparin Indication: chest pain/ACS  No Known Allergies  Patient Measurements: Height: 5\' 4"  (162.6 cm) Weight: 188 lb 11.4 oz (85.6 kg) IBW/kg (Calculated) : 54.7 Heparin Dosing Weight: 70.5 kg  Vital Signs: Temp: 97.9 F (36.6 C) (09/07 1200) Temp src: Oral (09/07 1200) BP: 150/55 mmHg (09/07 1100) Pulse Rate: 70 (09/07 1100)  Labs:  Recent Labs  02/20/14 0434 02/20/14 0715 02/20/14 1240 02/20/14 1831 02/21/14 0210 02/21/14 1130  HGB 11.2*  --   --   --  10.2*  --   HCT 37.2  --   --   --  33.5*  --   PLT 267  --   --   --  260  --   HEPARINUNFRC  --   --   --  0.12* 0.15* 0.37  CREATININE 3.15*  --   --   --  3.30*  --   TROPONINI  --  2.62* 6.24* 6.11*  --   --     Estimated Creatinine Clearance: 15.1 ml/min (by C-G formula based on Cr of 3.3).   Medical History: Past Medical History  Diagnosis Date  . Myocardial infarction   . Hypertension   . Chronic combined systolic and diastolic CHF (congestive heart failure)     a. EF 45-50% in 3491. b. Mixed systolic/diastolic - EF 79-15% by echo 2014, 35% by nuc.  Marland Kitchen Anemia   . Stroke     a. 2008: acute stroke in left centrum ovale. 06/2010: hemorrhagic stroke.  Marland Kitchen GERD (gastroesophageal reflux disease)   . Headache(784.0)   . Arthritis   . Depression   . CKD (chronic kidney disease), stage IV 08/29/2012  . Cancer     scalp  . Nephrolithiasis   . Hx of peptic ulcer   . Osteoarthritis, generalized   . Gastrointestinal hemorrhage     a. Pt reports she has h/o this many years ago, requiring 2 units of blood. Thinks it was in [place] but records not available. She does not know why this happened.  Marland Kitchen CAD (coronary artery disease)   . H/O medication noncompliance     a. Per notes, due to cost.   . CAD (coronary artery disease)     a. Stents in [facility] 2001 and cath 2008 with possible balloon or stent at [facility] (not available in [place]).   .  Hemorrhoid     a. Mild hemorrhoidal bleeding per previous DC note.  Marland Kitchen COPD (chronic obstructive pulmonary disease)     Medications:  See med history  Assessment: 79 yo lady admitted with SOB.  Her troponin is elevated and she will start heparin.  She was not on anticoagulation PTA.  She is now at goal HL of 0.37.  H/H has slightly trended down, plt wnl and stable with no reported s/s bleeding.  Goal of Therapy:  Heparin level 0.3-0.7 units/ml Monitor platelets by anticoagulation protocol: Yes   Plan:  - Continue heparin gtt @1450  u/hr - Confirm heparin level in 8 hours   - Daily HL and CBC while on heparin  Harolyn Rutherford, PharmD Clinical Pharmacist - Resident Pager: [phone] Pharmacy: [phone] 02/21/2014 12:19 PM

## 2014-02-22 DIAGNOSIS — I214 Non-ST elevation (NSTEMI) myocardial infarction: Secondary | ICD-10-CM | POA: Diagnosis present

## 2014-02-22 DIAGNOSIS — I5021 Acute systolic (congestive) heart failure: Secondary | ICD-10-CM | POA: Diagnosis present

## 2014-02-22 DIAGNOSIS — N184 Chronic kidney disease, stage 4 (severe): Secondary | ICD-10-CM

## 2014-02-22 DIAGNOSIS — I5040 Unspecified combined systolic (congestive) and diastolic (congestive) heart failure: Secondary | ICD-10-CM

## 2014-02-22 DIAGNOSIS — I509 Heart failure, unspecified: Secondary | ICD-10-CM

## 2014-02-22 DIAGNOSIS — I130 Hypertensive heart and chronic kidney disease with heart failure and stage 1 through stage 4 chronic kidney disease, or unspecified chronic kidney disease: Secondary | ICD-10-CM | POA: Diagnosis present

## 2014-02-22 DIAGNOSIS — N039 Chronic nephritic syndrome with unspecified morphologic changes: Secondary | ICD-10-CM

## 2014-02-22 LAB — BASIC METABOLIC PANEL
ANION GAP: 13 (ref 5–15)
BUN: 45 mg/dL — ABNORMAL HIGH (ref 6–23)
CALCIUM: 7.4 mg/dL — AB (ref 8.4–10.5)
CHLORIDE: 95 meq/L — AB (ref 96–112)
CO2: 29 mEq/L (ref 19–32)
Creatinine, Ser: 3.04 mg/dL — ABNORMAL HIGH (ref 0.50–1.10)
GFR calc Af Amer: 16 mL/min — ABNORMAL LOW (ref >90)
GFR calc non Af Amer: 14 mL/min — ABNORMAL LOW (ref >90)
GLUCOSE: 121 mg/dL — AB (ref 70–99)
POTASSIUM: 3.7 meq/L (ref 3.7–5.3)
SODIUM: 137 meq/L (ref 137–147)

## 2014-02-22 LAB — CBC
HEMATOCRIT: 32.1 % — AB (ref 36.0–46.0)
HEMOGLOBIN: 9.9 g/dL — AB (ref 12.0–15.0)
MCH: 24.9 pg — ABNORMAL LOW (ref 26.0–34.0)
MCHC: 30.8 g/dL (ref 30.0–36.0)
MCV: 80.7 fL (ref 78.0–100.0)
Platelets: 227 10*3/uL (ref 150–400)
RBC: 3.98 MIL/uL (ref 3.87–5.11)
RDW: 16.4 % — AB (ref 11.5–15.5)
WBC: 7.5 10*3/uL (ref 4.0–10.5)

## 2014-02-22 LAB — HEPARIN LEVEL (UNFRACTIONATED): Heparin Unfractionated: 0.57 IU/mL (ref 0.30–0.70)

## 2014-02-22 MED ORDER — HYDRALAZINE HCL 25 MG PO TABS
25.0000 mg | ORAL_TABLET | Freq: Three times a day (TID) | ORAL | Status: DC
  Administered 2014-02-22 – 2014-02-24 (×7): 25 mg via ORAL
  Filled 2014-02-22 (×9): qty 1

## 2014-02-22 MED ORDER — HEPARIN SODIUM (PORCINE) 5000 UNIT/ML IJ SOLN
5000.0000 [IU] | Freq: Three times a day (TID) | INTRAMUSCULAR | Status: DC
  Administered 2014-02-22 – 2014-02-24 (×6): 5000 [IU] via SUBCUTANEOUS
  Filled 2014-02-22 (×9): qty 1

## 2014-02-22 MED ORDER — ISOSORBIDE MONONITRATE ER 30 MG PO TB24
30.0000 mg | ORAL_TABLET | Freq: Every day | ORAL | Status: DC
  Administered 2014-02-22 – 2014-02-23 (×2): 30 mg via ORAL
  Filled 2014-02-22 (×3): qty 1

## 2014-02-22 MED ORDER — CLOPIDOGREL BISULFATE 75 MG PO TABS
75.0000 mg | ORAL_TABLET | Freq: Every day | ORAL | Status: DC
  Administered 2014-02-22 – 2014-02-24 (×3): 75 mg via ORAL
  Filled 2014-02-22 (×3): qty 1

## 2014-02-22 NOTE — Progress Notes (Signed)
Bed 3E12 available, Pt. Informed and verbalized understanding. Report called to [place]. Pt's belongings packed. Pt voided 150 ml clear amber urine post foley removal. Pt transported to 3E 12 with belongings via w/c. Will monitor.

## 2014-02-22 NOTE — Progress Notes (Signed)
1430 Report received rom Morrien <Rn  2h Transferrd into 3e 12 awake, alert and  Oriented . Kept comfortable in bed . Safety precaution observed . orIentation to room and eqipment done

## 2014-02-22 NOTE — Progress Notes (Signed)
[facility] PROGRESS NOTE  [name] OZH:[id number] DOB: 1934-06-28 DOA: 02/20/2014 PCP: Adella Hare, MD  Brief Narrative:  [name] is a 79 y/o F with history of COPD,  CKD stage IV, hypertensive heart disease with chronic combined CHF (EF 40-45% in 08/2012 with EF 35% by nuc), CAD (stent in [facility] 2001, cath in 2008 with possible balloon or stent), strokes (including hemorrhagic in 2012), prior GIB requiring transfusion (details unclear), prior medication noncompliance due to cost who presented to [facility] with dyspnea, chest tightness, and accelerated hypertension in the setting of running out of some of her medicines.   Assessment/Plan: Principal Problem:   NSTEMI type II - Cardiology managing - Pt considering options of whether or not to get cath  Diastolic CHF, acute on chronic - Cardiology managing.  Active Problems:   HYPERTENSION, BENIGN ESSENTIAL, LABILE - Pt on carvedilol and amlodipine. Will consider increasing amlodipine dose with continued elevated BP readings.    CAD -currently on aspirin and statine    Renal dysfunction- Gd 4 - Will monitor S creatinine - avoid ACEI    Chronic obstructive asthma, unspecified - stable continue symbicort  Code Status: full Family Communication: None at bedside Disposition Plan: Pending recommendations from cardiologist   Consultants:  Cardiology  Antibiotics:  None  HPI/Subjective: Pt has no new complaints. No acute issues reported overnight. States that she is not sure of what she wants to do at this juncture.  Objective: Filed Vitals:   02/22/14 1200  BP: 135/56  Pulse: 64  Temp:   Resp: 20    Intake/Output Summary (Last 24 hours) at 02/22/14 1259 Last data filed at 02/22/14 1100  Gross per 24 hour  Intake 1987.3 ml  Output   4400 ml  Net -2412.7 ml   Filed Weights   02/20/14 1030 02/21/14 0500 02/22/14 0350  Weight: 85.9 kg (189 lb 6 oz) 85.6 kg (188 lb 11.4 oz) 85 kg (187  lb 6.3 oz)    Exam:   General:  Pt in nad, alert and awake  Cardiovascular: rrr, no rubs  Respiratory: cta bl, no wheezes  Abdomen: soft, NT, ND  Musculoskeletal: no cyanosis or clubbing   Data Reviewed: Basic Metabolic Panel:  Recent Labs Lab 02/20/14 0434 02/21/14 0210 02/22/14 0210  NA 146 143 137  K 4.3 4.2 3.7  CL 104 101 95*  CO2 27 31 29   GLUCOSE 208* 107* 121*  BUN 46* 45* 45*  CREATININE 3.15* 3.30* 3.04*  CALCIUM 7.7* 7.3* 7.4*   Liver Function Tests:  Recent Labs Lab 02/20/14 1018  AST 20  ALT 8  ALKPHOS 63  BILITOT 0.2*  PROT 6.6  ALBUMIN 3.2*   No results found for this basename: LIPASE, AMYLASE,  in the last 168 hours No results found for this basename: AMMONIA,  in the last 168 hours CBC:  Recent Labs Lab 02/20/14 0434 02/21/14 0210 02/22/14 0210  WBC 12.0* 8.5 7.5  HGB 11.2* 10.2* 9.9*  HCT 37.2 33.5* 32.1*  MCV 84.0 81.7 80.7  PLT 267 260 227   Cardiac Enzymes:  Recent Labs Lab 02/20/14 0715 02/20/14 1240 02/20/14 1831  TROPONINI 2.62* 6.24* 6.11*   BNP (last 3 results)  Recent Labs  02/20/14 0434  PROBNP 34048.0*   CBG: No results found for this basename: GLUCAP,  in the last 168 hours  Recent Results (from the past 240 hour(s))  MRSA PCR SCREENING     Status: None   Collection Time  02/20/14 10:25 AM      Result Value Ref Range Status   MRSA by PCR NEGATIVE  NEGATIVE Final   Comment:            The GeneXpert MRSA Assay (FDA     approved for NASAL specimens     only), is one component of a     comprehensive MRSA colonization     surveillance program. It is not     intended to diagnose MRSA     infection nor to guide or     monitor treatment for     MRSA infections.     Studies: No results found.  Scheduled Meds: . amLODipine  5 mg Oral Daily  . antiseptic oral rinse  7 mL Mouth Rinse BID  . aspirin  81 mg Oral Daily  . atorvastatin  20 mg Oral q1800  . carvedilol  6.25 mg Oral BID WC  .  clopidogrel  75 mg Oral Daily  . heparin subcutaneous  5,000 Units Subcutaneous 3 times per day  . hydrALAZINE  25 mg Oral 3 times per day  . isosorbide mononitrate  30 mg Oral Daily  . sodium chloride  3 mL Intravenous Q12H   Continuous Infusions:    Time spent: > 35 minutes    Velvet Bathe  [facility] Pager [phone] If 7PM-7AM, please contact night-coverage at [email or web address], password [facility] 02/22/2014, 12:59 PM  LOS: 2 days

## 2014-02-22 NOTE — Progress Notes (Addendum)
ANTICOAGULATION CONSULT NOTE - Follow Up Consult  Pharmacy Consult for heparin Indication: chest pain/ACS  No Known Allergies  Patient Measurements: Height: 5\' 4"  (162.6 cm) Weight: 187 lb 6.3 oz (85 kg) IBW/kg (Calculated) : 54.7 Heparin Dosing Weight: 70.5 kg  Vital Signs: Temp: 98.6 F (37 C) (09/08 0750) Temp src: Oral (09/08 0750) BP: 136/49 mmHg (09/08 1000) Pulse Rate: 63 (09/08 1000)  Labs:  Recent Labs  02/20/14 0434 02/20/14 0715 02/20/14 1240  02/20/14 1831 02/21/14 0210 02/21/14 1130 02/21/14 2005 02/22/14 0210  HGB 11.2*  --   --   --   --  10.2*  --   --  9.9*  HCT 37.2  --   --   --   --  33.5*  --   --  32.1*  PLT 267  --   --   --   --  260  --   --  227  HEPARINUNFRC  --   --   --   < > 0.12* 0.15* 0.37 0.54 0.57  CREATININE 3.15*  --   --   --   --  3.30*  --   --  3.04*  TROPONINI  --  2.62* 6.24*  --  6.11*  --   --   --   --   < > = values in this interval not displayed.  Estimated Creatinine Clearance: 16.3 ml/min (by C-G formula based on Cr of 3.04).   Medications:  Scheduled:  . amLODipine  5 mg Oral Daily  . antiseptic oral rinse  7 mL Mouth Rinse BID  . aspirin  81 mg Oral Daily  . atorvastatin  20 mg Oral q1800  . carvedilol  6.25 mg Oral BID WC  . hydrALAZINE  10 mg Oral 3 times per day  . sodium chloride  3 mL Intravenous Q12H   Infusions:  . heparin 1,450 Units/hr (02/21/14 2300)  . nitroGLYCERIN 50 mcg/min (02/22/14 0400)    Assessment: 79 yo f admitted on 9/6 with SOB and CP. Pharmacy was consulted to start and adjust heparin.  She was not on anticoagulation prior to admission.  Her HL this AM was therapeutic at 0.57.  Will continue current drip at a rate of 1450 units/hr and monitor HL with AM labs.  Goal of Therapy:  Heparin level 0.3-0.7 units/ml Monitor platelets by anticoagulation protocol: Yes   Plan:  Continue heparin at 1450 units/hr Monitor daily heparin level Monitor hgb/plts, s/s of bleeding, clinical  course  Cassie L. Nicole Kindred, PharmD Clinical Pharmacy Resident Pager: [phone] 02/22/2014 10:35 AM   Heparin discontinued.  Cassie L. Nicole Kindred, PharmD Clinical Pharmacy Resident Pager: [phone] 02/22/2014 1:20 PM

## 2014-02-22 NOTE — Progress Notes (Signed)
Received EPIC referral for [place] Management services. Came to bedside to confirm patient's Primary Care MD. She states her Primary Care MD has since retired and she does not have a new one as of yet. States she plans on following up with her Cardiologist. Patient is active with [employer] program however. Patient states her [place] is supposed to help her find a new Primary Care MD. Patient not eligible for [facility] Care Management services at this time. Will contact the [place] as well to confirm. Also will make inpatient RNCM aware of visit. Of note, patient's cell number is [phone]. This is noted as patient did not know what her cell phone number was. She lives with family as well. [facility] [phone]

## 2014-02-22 NOTE — Progress Notes (Signed)
DAILY PROGRESS NOTE  Subjective:  No events overnight. Her chest pain has resolved. Still with some dyspnea, but improved. NSTEMI with peak troponin over 6 - almost certainly has significant CAD. Severe CKD 4 with GFR in the 10-15 range. Echo shows worsening cardiomyopathy with EF 25-30%, generally global hypokinesis.  Objective:  Temp:  [97.8 F (36.6 C)-98.6 F (37 C)] 98.6 F (37 C) (09/08 0750) Pulse Rate:  [59-78] 63 (09/08 1000) Resp:  [16-31] 20 (09/08 1000) BP: (132-191)/(46-78) 136/49 mmHg (09/08 1000) SpO2:  [96 %-100 %] 98 % (09/08 1000) Weight:  [187 lb 6.3 oz (85 kg)] 187 lb 6.3 oz (85 kg) (09/08 0350) Weight change: -1 lb 15.7 oz (-0.9 kg)  Intake/Output from previous day: 09/07 0701 - 09/08 0700 In: 2676.8 [P.O.:1920; I.V.:756.8] Out: 4800 [Urine:4800]  Intake/Output from this shift: Total I/O In: 481 [P.O.:360; I.V.:121] Out: -   Medications: Current Facility-Administered Medications  Medication Dose Route Frequency Provider Last Rate Last Dose  . 0.9 %  sodium chloride infusion  250 mL Intravenous PRN Theressa Millard, MD   250 mL at 02/21/14 0800  . acetaminophen (TYLENOL) tablet 650 mg  650 mg Oral Q4H PRN Theressa Millard, MD      . amLODipine (NORVASC) tablet 5 mg  5 mg Oral Daily Theressa Millard, MD   5 mg at 02/22/14 0910  . antiseptic oral rinse (CPC / CETYLPYRIDINIUM CHLORIDE 0.05%) solution 7 mL  7 mL Mouth Rinse BID Velvet Bathe, MD   7 mL at 02/22/14 1000  . aspirin chewable tablet 81 mg  81 mg Oral Daily Theressa Millard, MD   81 mg at 02/22/14 0910  . atorvastatin (LIPITOR) tablet 20 mg  20 mg Oral q1800 Theressa Millard, MD   20 mg at 02/21/14 1739  . budesonide-formoterol (SYMBICORT) 80-4.5 MCG/ACT inhaler 2 puff  2 puff Inhalation BID PRN Theressa Millard, MD      . carvedilol (COREG) tablet 6.25 mg  6.25 mg Oral BID WC Theressa Millard, MD   6.25 mg at 02/22/14 0910  . heparin ADULT infusion 100 units/mL (25000 units/250  mL)  1,450 Units/hr Intravenous Continuous Velvet Bathe, MD 14.5 mL/hr at 02/21/14 2300 1,450 Units/hr at 02/21/14 2300  . hydrALAZINE (APRESOLINE) injection 10 mg  10 mg Intravenous Q6H PRN Theressa Millard, MD      . hydrALAZINE (APRESOLINE) tablet 10 mg  10 mg Oral 3 times per day Thayer Headings, MD   10 mg at 02/22/14 0647  . nitroGLYCERIN 50 mg in dextrose 5 % 250 mL (0.2 mg/mL) infusion  2-200 mcg/min Intravenous Titrated Theressa Millard, MD 15 mL/hr at 02/22/14 0400 50 mcg/min at 02/22/14 0400  . ondansetron (ZOFRAN) injection 4 mg  4 mg Intravenous Q6H PRN Theressa Millard, MD      . ranolazine (RANEXA) 12 hr tablet 500 mg  500 mg Oral BID Theressa Millard, MD   500 mg at 02/22/14 0910  . sodium chloride 0.9 % injection 3 mL  3 mL Intravenous Q12H Theressa Millard, MD   3 mL at 02/22/14 0911  . sodium chloride 0.9 % injection 3 mL  3 mL Intravenous PRN Theressa Millard, MD        Physical Exam: General appearance: alert and no distress Neck: no carotid bruit and no JVD Lungs: diminished breath sounds bibasilar Heart: regular rate and rhythm, S1, S2 normal and systolic murmur: early systolic 2/6, blowing at  apex Abdomen: soft, non-tender; bowel sounds normal; no masses,  no organomegaly Extremities: extremities normal, atraumatic, no cyanosis or edema Pulses: 2+ and symmetric Skin: Skin color, texture, turgor normal. No rashes or lesions Neurologic: Grossly normal Psych: Sad, somewhat depressed  Lab Results: Results for orders placed during the hospital encounter of 02/20/14 (from the past 48 hour(s))  MRSA PCR SCREENING     Status: None   Collection Time    02/20/14 10:25 AM      Result Value Ref Range   MRSA by PCR NEGATIVE  NEGATIVE   Comment:            The GeneXpert MRSA Assay (FDA     approved for NASAL specimens     only), is one component of a     comprehensive MRSA colonization     surveillance program. It is not     intended to diagnose MRSA      infection nor to guide or     monitor treatment for     MRSA infections.  TROPONIN I     Status: Abnormal   Collection Time    02/20/14 12:40 PM      Result Value Ref Range   Troponin I 6.24 (*) <0.30 ng/mL   Comment:            Due to the release kinetics of cTnI,     a negative result within the first hours     of the onset of symptoms does not rule out     myocardial infarction with certainty.     If myocardial infarction is still suspected,     repeat the test at appropriate intervals.     CRITICAL VALUE NOTED.  VALUE IS CONSISTENT WITH PREVIOUSLY REPORTED AND CALLED VALUE.  TROPONIN I     Status: Abnormal   Collection Time    02/20/14  6:31 PM      Result Value Ref Range   Troponin I 6.11 (*) <0.30 ng/mL   Comment:            Due to the release kinetics of cTnI,     a negative result within the first hours     of the onset of symptoms does not rule out     myocardial infarction with certainty.     If myocardial infarction is still suspected,     repeat the test at appropriate intervals.     CRITICAL VALUE NOTED.  VALUE IS CONSISTENT WITH PREVIOUSLY REPORTED AND CALLED VALUE.  HEPARIN LEVEL (UNFRACTIONATED)     Status: Abnormal   Collection Time    02/20/14  6:31 PM      Result Value Ref Range   Heparin Unfractionated 0.12 (*) 0.30 - 0.70 IU/mL   Comment:            IF HEPARIN RESULTS ARE BELOW     EXPECTED VALUES, AND PATIENT     DOSAGE HAS BEEN CONFIRMED,     SUGGEST FOLLOW UP TESTING     OF ANTITHROMBIN III LEVELS.  BASIC METABOLIC PANEL     Status: Abnormal   Collection Time    02/21/14  2:10 AM      Result Value Ref Range   Sodium 143  137 - 147 mEq/L   Potassium 4.2  3.7 - 5.3 mEq/L   Chloride 101  96 - 112 mEq/L   CO2 31  19 - 32 mEq/L   Glucose, Bld 107 (*) 70 - 99 mg/dL  BUN 45 (*) 6 - 23 mg/dL   Creatinine, Ser 8.30 (*) 0.50 - 1.10 mg/dL   Calcium 7.3 (*) 8.4 - 10.5 mg/dL   GFR calc non Af Amer 12 (*) >90 mL/min   GFR calc Af Amer 14 (*) >90 mL/min     Comment: (NOTE)     The eGFR has been calculated using the CKD EPI equation.     This calculation has not been validated in all clinical situations.     eGFR's persistently <90 mL/min signify possible Chronic Kidney     Disease.   Anion gap 11  5 - 15  CBC     Status: Abnormal   Collection Time    02/21/14  2:10 AM      Result Value Ref Range   WBC 8.5  4.0 - 10.5 K/uL   RBC 4.10  3.87 - 5.11 MIL/uL   Hemoglobin 10.2 (*) 12.0 - 15.0 g/dL   HCT 32.2 (*) 01.9 - 92.4 %   MCV 81.7  78.0 - 100.0 fL   MCH 24.9 (*) 26.0 - 34.0 pg   MCHC 30.4  30.0 - 36.0 g/dL   RDW 15.5 (*) 16.1 - 44.3 %   Platelets 260  150 - 400 K/uL  HEPARIN LEVEL (UNFRACTIONATED)     Status: Abnormal   Collection Time    02/21/14  2:10 AM      Result Value Ref Range   Heparin Unfractionated 0.15 (*) 0.30 - 0.70 IU/mL   Comment:            IF HEPARIN RESULTS ARE BELOW     EXPECTED VALUES, AND PATIENT     DOSAGE HAS BEEN CONFIRMED,     SUGGEST FOLLOW UP TESTING     OF ANTITHROMBIN III LEVELS.  HEPARIN LEVEL (UNFRACTIONATED)     Status: None   Collection Time    02/21/14 11:30 AM      Result Value Ref Range   Heparin Unfractionated 0.37  0.30 - 0.70 IU/mL   Comment:            IF HEPARIN RESULTS ARE BELOW     EXPECTED VALUES, AND PATIENT     DOSAGE HAS BEEN CONFIRMED,     SUGGEST FOLLOW UP TESTING     OF ANTITHROMBIN III LEVELS.  HEPARIN LEVEL (UNFRACTIONATED)     Status: None   Collection Time    02/21/14  8:05 PM      Result Value Ref Range   Heparin Unfractionated 0.54  0.30 - 0.70 IU/mL   Comment:            IF HEPARIN RESULTS ARE BELOW     EXPECTED VALUES, AND PATIENT     DOSAGE HAS BEEN CONFIRMED,     SUGGEST FOLLOW UP TESTING     OF ANTITHROMBIN III LEVELS.  BASIC METABOLIC PANEL     Status: Abnormal   Collection Time    02/22/14  2:10 AM      Result Value Ref Range   Sodium 137  137 - 147 mEq/L   Potassium 3.7  3.7 - 5.3 mEq/L   Chloride 95 (*) 96 - 112 mEq/L   CO2 29  19 - 32 mEq/L    Glucose, Bld 121 (*) 70 - 99 mg/dL   BUN 45 (*) 6 - 23 mg/dL   Creatinine, Ser 2.46 (*) 0.50 - 1.10 mg/dL   Calcium 7.4 (*) 8.4 - 10.5 mg/dL   GFR calc non Af Denyse Dago  14 (*) >90 mL/min   GFR calc Af Amer 16 (*) >90 mL/min   Comment: (NOTE)     The eGFR has been calculated using the CKD EPI equation.     This calculation has not been validated in all clinical situations.     eGFR's persistently <90 mL/min signify possible Chronic Kidney     Disease.   Anion gap 13  5 - 15  CBC     Status: Abnormal   Collection Time    02/22/14  2:10 AM      Result Value Ref Range   WBC 7.5  4.0 - 10.5 K/uL   RBC 3.98  3.87 - 5.11 MIL/uL   Hemoglobin 9.9 (*) 12.0 - 15.0 g/dL   HCT 32.1 (*) 36.0 - 46.0 %   MCV 80.7  78.0 - 100.0 fL   MCH 24.9 (*) 26.0 - 34.0 pg   MCHC 30.8  30.0 - 36.0 g/dL   RDW 16.4 (*) 11.5 - 15.5 %   Platelets 227  150 - 400 K/uL  HEPARIN LEVEL (UNFRACTIONATED)     Status: None   Collection Time    02/22/14  2:10 AM      Result Value Ref Range   Heparin Unfractionated 0.57  0.30 - 0.70 IU/mL   Comment:            IF HEPARIN RESULTS ARE BELOW     EXPECTED VALUES, AND PATIENT     DOSAGE HAS BEEN CONFIRMED,     SUGGEST FOLLOW UP TESTING     OF ANTITHROMBIN III LEVELS.    Imaging: No results found.  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 Discussed with son [name] on the phone, he re-iterated that she does not want dialysis. She does not want heart catheterization if it could lead to dialysis. We will therefore manage this medically. Hydralazine has been started - will uptitrate. Switch nitro gtts over to  imdur. Add plavix 75 mg daily for medical therapy of NSTEMI. Continue low dose aspirin. Could be transferred to the heart failure floor later to day. Switch to oral diuretics probably tomorrow.  Time Spent Directly with Patient:  15 minutes  Length of Stay:  LOS: 2 days   Pixie Casino, MD, [facility] Attending Cardiologist [facility]  Ketra Duchesne C 02/22/2014, 10:23 AM

## 2014-02-23 DIAGNOSIS — R29818 Other symptoms and signs involving the nervous system: Secondary | ICD-10-CM

## 2014-02-23 DIAGNOSIS — I5021 Acute systolic (congestive) heart failure: Secondary | ICD-10-CM

## 2014-02-23 LAB — CBC
HCT: 34.3 % — ABNORMAL LOW (ref 36.0–46.0)
HEMOGLOBIN: 10.5 g/dL — AB (ref 12.0–15.0)
MCH: 24.6 pg — ABNORMAL LOW (ref 26.0–34.0)
MCHC: 30.6 g/dL (ref 30.0–36.0)
MCV: 80.3 fL (ref 78.0–100.0)
PLATELETS: 251 10*3/uL (ref 150–400)
RBC: 4.27 MIL/uL (ref 3.87–5.11)
RDW: 16.5 % — ABNORMAL HIGH (ref 11.5–15.5)
WBC: 6.2 10*3/uL (ref 4.0–10.5)

## 2014-02-23 LAB — BASIC METABOLIC PANEL
Anion gap: 14 (ref 5–15)
BUN: 49 mg/dL — ABNORMAL HIGH (ref 6–23)
CHLORIDE: 96 meq/L (ref 96–112)
CO2: 28 mEq/L (ref 19–32)
Calcium: 8.1 mg/dL — ABNORMAL LOW (ref 8.4–10.5)
Creatinine, Ser: 3.26 mg/dL — ABNORMAL HIGH (ref 0.50–1.10)
GFR, EST AFRICAN AMERICAN: 15 mL/min — AB (ref >90)
GFR, EST NON AFRICAN AMERICAN: 13 mL/min — AB (ref >90)
Glucose, Bld: 100 mg/dL — ABNORMAL HIGH (ref 70–99)
Potassium: 4 mEq/L (ref 3.7–5.3)
SODIUM: 138 meq/L (ref 137–147)

## 2014-02-23 MED ORDER — CARVEDILOL 12.5 MG PO TABS
12.5000 mg | ORAL_TABLET | Freq: Two times a day (BID) | ORAL | Status: DC
  Administered 2014-02-23 – 2014-02-24 (×2): 12.5 mg via ORAL
  Filled 2014-02-23 (×4): qty 1

## 2014-02-23 MED ORDER — CARVEDILOL 6.25 MG PO TABS
6.2500 mg | ORAL_TABLET | ORAL | Status: AC
  Administered 2014-02-23: 6.25 mg via ORAL
  Filled 2014-02-23: qty 1

## 2014-02-23 NOTE — Progress Notes (Signed)
[facility] PROGRESS NOTE  [name] DVV:[id number] DOB: 14-Sep-1934 DOA: 02/20/2014 PCP: Adella Hare, MD  Assessment/Plan:  Diastolic CHF, acute on chronic  - Diuresed with IV Lasix and IV NTG Drip- appears more euvolemic without no fluid overload on exam -2D Echo-Severe global reduction in LV function; moderate LVH and LVE; grade 1 diastolic dysfunction.  -Negative 1.5 lbs fluid balnce since yesterday - Cardiology following -continue PT/OT eval  NSTEMI  -continue aspirin, beta blocker, statin, and plavix -Imdur/hydralazine in lieu of AceI/ARB due to renal insufficiency -Appreciate cardiology- Patient does not want dialysis; and refuses heart catheterization because it could lead to dialysis.  Will need PCP on D/C   Hypertension, benign essential, labile -stable-patient noncompliant with home mediations- does not have PCP and will need to be set up on with one on D/C -continue amlodipine    CAD/Chest pain  -Initially placed on IV NTG drip, ASA -continue ASA -Cycle Troponins   -Continue cardiac monitoring   CKD stage IV   -9/9- creatinine 3.26 -Imdur/hydralazine in lieuce Inhibitor/ARB due to renal insufficency  Anemia of chronic disease -Due to chronic kidney disease -No active bleeding, continue to monitor   Chronic obstructive asthma, unspecified -No SOB, wheezing- on 2L on [place] sat 96% -continue Symbicort   DVT Prophylaxis Heparin  Code Status: Full Family Communication: No family at bedside Disposition Plan:    Consultants:  Cardiology  Procedures:  None  Antibiotics:  None  HPI   79 y/o female with PMH of CKD stage IV, COPD, accelerated hypertension, acute on chronic combined systolic and diastolic CHF (EF decreased to 25-30%), CAD (stent in [facility] 2001, cath in 2008 with possible balloon or stent), strokes (including hemorrhagic in 2012), prior GIB requiring transfusion, prior medication noncompliance, who presented to ED with worsening  SOB and DOE and lower leg edema over the past week.  She states she ran out of some her medications.  She was found hypoxemic with O2 sats at 80%, and was placed on 100% O2, with improvement in respiratory status. [place] 34048.0, and her chest X-Ray reveals pulmonary edema. She was administered 80 mg of IV Lasix X1.    Subjective: No chest pain or SOB.   Objective: Filed Vitals:   02/23/14 1353  BP: 121/44  Pulse: 65  Temp: 97.7 F (36.5 C)  Resp: 20    Intake/Output Summary (Last 24 hours) at 02/23/14 1419 Last data filed at 02/23/14 1300  Gross per 24 hour  Intake    940 ml  Output   1800 ml  Net   -860 ml   Filed Weights   02/22/14 0350 02/22/14 1750 02/23/14 0710  Weight: 85 kg (187 lb 6.3 oz) 86.002 kg (189 lb 9.6 oz) 85.5 kg (188 lb 7.9 oz)    Exam:  Gen: chronically ill appearing white female in NAD HEENT: Normocephalic, atraumatic.  Pupils symmertrical.  Moist mucosa.   Chest: clear to auscultate bilaterally, no ronchi or rales  Cardiac: Regular rate and rhythm, S1-S2, no rubs murmurs or gallops  Abdomen: soft, non tender, non distended, +bowel sounds. No guarding or rigidity  Extremities: Symmetrical in appearance without cyanosis. No edema noted Neurological: Alert awake oriented to time place and person.  Psychiatric: Appears anxious and states she wants to go home.   Data Reviewed: Basic Metabolic Panel:  Recent Labs Lab 02/20/14 0434 02/21/14 0210 02/22/14 0210 02/23/14 0426  NA 146 143 137 138  K 4.3 4.2 3.7 4.0  CL  104 101 95* 96  CO2 27 31 29 28   GLUCOSE 208* 107* 121* 100*  BUN 46* 45* 45* 49*  CREATININE 3.15* 3.30* 3.04* 3.26*  CALCIUM 7.7* 7.3* 7.4* 8.1*   Liver Function Tests:  Recent Labs Lab 02/20/14 1018  AST 20  ALT 8  ALKPHOS 63  BILITOT 0.2*  PROT 6.6  ALBUMIN 3.2*   No results found for this basename: LIPASE, AMYLASE,  in the last 168 hours No results found for this basename: AMMONIA,  in the last 168  hours CBC:  Recent Labs Lab 02/20/14 0434 02/21/14 0210 02/22/14 0210 02/23/14 0426  WBC 12.0* 8.5 7.5 6.2  HGB 11.2* 10.2* 9.9* 10.5*  HCT 37.2 33.5* 32.1* 34.3*  MCV 84.0 81.7 80.7 80.3  PLT 267 260 227 251   Cardiac Enzymes:  Recent Labs Lab 02/20/14 0715 02/20/14 1240 02/20/14 1831  TROPONINI 2.62* 6.24* 6.11*   BNP (last 3 results)  Recent Labs  02/20/14 0434  PROBNP 34048.0*   CBG: No results found for this basename: GLUCAP,  in the last 168 hours  Recent Results (from the past 240 hour(s))  MRSA PCR SCREENING     Status: None   Collection Time    02/20/14 10:25 AM      Result Value Ref Range Status   MRSA by PCR NEGATIVE  NEGATIVE Final   Comment:            The GeneXpert MRSA Assay (FDA     approved for NASAL specimens     only), is one component of a     comprehensive MRSA colonization     surveillance program. It is not     intended to diagnose MRSA     infection nor to guide or     monitor treatment for     MRSA infections.     Studies: No results found.  Scheduled Meds: . amLODipine  5 mg Oral Daily  . antiseptic oral rinse  7 mL Mouth Rinse BID  . aspirin  81 mg Oral Daily  . atorvastatin  20 mg Oral q1800  . carvedilol  12.5 mg Oral BID WC  . clopidogrel  75 mg Oral Daily  . heparin subcutaneous  5,000 Units Subcutaneous 3 times per day  . hydrALAZINE  25 mg Oral 3 times per day  . isosorbide mononitrate  30 mg Oral Daily  . 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 If 7PM-7AM, please contact night-coverage at [email or web address], password [facility] 02/23/2014, 2:19 PM  LOS: 3 days    Addendum  I saw evaluated patient on 02/23/2014 and agree with above findings. She is a 79 year old, admitted for non-ST segment elevation myocardial function and diastolic congestive heart failure, diuresing well. She declined cardiac catheterization given concerns that this could lead to the need for hemodialysis. Today we ambulated down the hallway, patient deconditioned, having a steady gait, high fall risk. Physical therapy consult

## 2014-02-23 NOTE — Care Management Note (Addendum)
Page 2 of 2   02/24/2014     2:35:26 PM CARE MANAGEMENT NOTE 02/24/2014  Patient:  [name], [name]   Account Number:  [id number]  Date Initiated:  02/23/2014  Documentation initiated by:  Bryana Froemming  Subjective/Objective Assessment:   CP     Action/Plan:   CM to follow for disposition needs   Anticipated DC Date:  02/24/2014   Anticipated DC Plan:  [place]  CM consult  Follow-up appt scheduled  PCP issues      [facility] Choice  HOME HEALTH   Choice offered to / List presented to:  C-1 Patient   DME arranged  Vassie Moselle      DME agency  [place] arranged  HH-1 RN  HH-10 DISEASE MANAGEMENT  [place]      [place].   Status of service:  Completed, signed off Medicare Important Message given?  YES (If response is "NO", the following Medicare IM given date fields will be blank) Date Medicare IM given:  02/24/2014 Medicare IM given by:  Lakia Gritton Date Additional Medicare IM given:   Additional Medicare IM given by:    Discharge Disposition:  [place]  Per UR Regulation:  Reviewed for med. necessity/level of care/duration of stay  If discussed at [place] of Stay Meetings, dates discussed:    Comments:  Cortlandt Capuano RN, BSN, MSHL, CCM  Nurse - Case Manager,  (Unit [phone]  02/24/2014 Dispositon Plan: Patient confirms she lives with Son and 3 grandchildren. PT RECS:  PT and Cane (patient to purchase at [facility]) Cardiac Rehab recommends walker (ordered with [facility]) Home with HHS:  RN, PT, HHA and SW for communty resources to free up money for medication co-pays and / or other resources. ([facility] notified)   Tadan Shill RN, BSN, MSHL, CCM  Nurse - Case Manager,  (Unit [phone]  02/23/2014 IM:  Initial IM provided by admissions on 02/23/2014 DX: CP Social:   From home with children. Patient delayed medical intervention d/t not wanting to leave home and her grandchildren. Transportation:  Family Medications:  hx/o noncompliance with filling Rx's d/t issues with PCP calling in at the time patient able to pick up meds (3rd of the month following SS check).  Patient states "Not worth a 2nd trip to [facility] to pick up meds." Patient c/o cost of co-pays for meds under her AARP/U[employer] plan.   CM provided education on $4.00 med list at [facility] as resource. Pharmacy:  [place] DME:  NO home oxygen HHS:  Hx/o past services with [facility] and elects same provider if needs services at d/c. [facility]:  Active with meds and SW needs PCP:  Dr. Adella Hare (Retired).  [employer] Representative providing patient with MD names in network at bedside. CM contacted MD but not a working # and other # belongs to an urgent care center. CM contacted [facility] and requested appt with female MD per patient request.  Confirmed in network provider with office staff and [facility] Representative/Diane.   Follow up with Lynne Logan, MD. (Initial 1st appointment with  Primary Care Doctor,  Dr. Nancy Fetter 03/16/2014 at 9:15am. Located at the intersection of [place] and [address]. Please take ID or Drivers License, [employer], and co-pay listed on insurance card.  )  Specialty: Family Medicine   Contact information:   [address] [place] [address] [phone] Dispo Plan:  [facility]:  RN CM and SW Services

## 2014-02-23 NOTE — Progress Notes (Signed)
1734 eating dinner dangling legs while eating . No complaint presented  Pt. Anticipating discharge to home soon . Not anxious

## 2014-02-23 NOTE — Progress Notes (Signed)
PT Cancellation Note  Patient Details Name: [name] MRN: [id number] DOB: [birth year]   Cancelled Treatment:    Reason Eval/Treat Not Completed: Fatigue/lethargy limiting ability to participate Pt seen ambulating unsteadily with Dr. Coralyn Pear earlier (refusing RW when offered) and just finished walking with cardiac rehab.  Will attempt eval tomorrow.   Husna Krone,KATHrine E 02/23/2014, 2:54 PM Carmelia Bake, PT, DPT 02/23/2014 Pager: [phone]

## 2014-02-23 NOTE — Progress Notes (Signed)
Patient: [name] / Admit Date: 02/20/2014 / Date of Encounter: 02/23/2014, 9:45 AM   Subjective: No CP or SOB. Notes waxing/waning chronic abdominal pain.   Objective: Telemetry: NSR 70s Physical Exam: Blood pressure 153/86, pulse 70, temperature 98.2 F (36.8 C), temperature source Oral, resp. rate 18, height 5\' 4"  (1.626 m), weight 188 lb 7.9 oz (85.5 kg), SpO2 95.00%. General: Chronically ill appearing somewhat dissheveled WF in no acute distress.  Head: Normocephalic, atraumatic, sclera non-icteric, no xanthomas, nares are without discharge.  Neck: Negative for carotid bruits. JVD not elevated.  Lungs: Decreased BS at bases, otherwise clear without rales, wheezes or rhonchi. Breathing is unlabored.  Heart: RRR with S1 S2. No murmurs, rubs, or gallops appreciated.  Abdomen: Soft, non-tender, non-distended with normoactive bowel sounds. No hepatomegaly. No rebound/guarding. No obvious abdominal masses.  Extremities: No clubbing or cyanosis. No significant edema. Distal pedal pulses are in tact.  Neuro: Alert and oriented X 3. No facial asymmetry. No focal deficit. Moves all extremities spontaneously.  Psych: Responds to questions appropriately with a somewhat slowed affect similar to the other day in ER.  Intake/Output Summary (Last 24 hours) at 02/23/14 0945 Last data filed at 02/23/14 0716  Gross per 24 hour  Intake  484.5 ml  Output   1525 ml  Net -1040.5 ml    Inpatient Medications:  . amLODipine  5 mg Oral Daily  . antiseptic oral rinse  7 mL Mouth Rinse BID  . aspirin  81 mg Oral Daily  . atorvastatin  20 mg Oral q1800  . carvedilol  6.25 mg Oral BID WC  . clopidogrel  75 mg Oral Daily  . heparin subcutaneous  5,000 Units Subcutaneous 3 times per day  . hydrALAZINE  25 mg Oral 3 times per day  . isosorbide mononitrate  30 mg Oral Daily  . sodium chloride  3 mL Intravenous Q12H   Infusions:    Labs:  Recent Labs  02/22/14 0210 02/23/14 0426  NA 137  138  K 3.7 4.0  CL 95* 96  CO2 29 28  GLUCOSE 121* 100*  BUN 45* 49*  CREATININE 3.04* 3.26*  CALCIUM 7.4* 8.1*    Recent Labs  02/20/14 1018  AST 20  ALT 8  ALKPHOS 63  BILITOT 0.2*  PROT 6.6  ALBUMIN 3.2*    Recent Labs  02/22/14 0210 02/23/14 0426  WBC 7.5 6.2  HGB 9.9* 10.5*  HCT 32.1* 34.3*  MCV 80.7 80.3  PLT 227 251    Recent Labs  02/20/14 1240 02/20/14 1831  TROPONINI 6.24* 6.11*   No components found with this basename: POCBNP,  No results found for this basename: HGBA1C,  in the last 72 hours   Radiology/Studies:  Dg Chest Port 1 View  02/20/2014   CLINICAL DATA:  Shortness of breath and chest pain  EXAM: PORTABLE CHEST - 1 VIEW  COMPARISON:  08/25/2012  FINDINGS: Technically limited study due to patient positioning. Shallow inspiration. Heart size and pulmonary vascularity appear increased. Diffuse interstitial changes in the lungs. No focal consolidation. No blunting of costophrenic angles. No pneumothorax.  IMPRESSION: Cardiac enlargement with mild vascular congestion and interstitial edema.   Electronically Signed   By: Lucienne Capers M.D.   On: 02/20/2014 04:51     Assessment and Plan  79 y/o F with history of CKD stage IV, COPD, hypertensive heart disease with chronic combined CHF (EF 40-45% in 08/2012 with EF 35% by nuc, down to 25-30% this admission), CAD (  stent in [facility] 2001, cath in 2008 with possible balloon or stent), strokes (including hemorrhagic in 2012), prior GIB requiring transfusion (details unclear), prior medication noncompliance who presented to [facility] with dyspnea, chest tightness, accelerated hypertension, and NSTEMI in the setting of running out of some of her medicines.   1. Acute respiratory failure  2. Acute on chronic combined systolic and diastolic CHF - EF decreased to 25-30% 3. Accelerated HTN  4. NSTEMI vs demand ischemia with underlying CAD 5. AKI on CKD stage IV  6. H/o medication noncompliance  7. Ongoing  tobacco abuse - patient recalcitrant to believe this affects her health despite counseling  It has been confirmed that she does NOT want dialysis, and she does not heart heart catheterization if it could lead to dialysis. She becomes very anxious at even the discussion of a heart cath. Plan for medical therapy including aspirin, beta blocker, statin, and addition of Plavix for NSTEMI. Pt does have h/o prior bleeding events and was instructed to notify her doctor ASAP of any signs of bleeding. She reports rare spotty hemorrhoidal bleeding after straining, none during admission. No ACEI/ARB due to worsening renal function - she is on Imdur/hydralazine in lieu of this. Treated with short-term IV Lasix, now appears more euvolemic. Will increase Coreg to 12.5mg  BID (already got 6.25mg  this AM - will give additional 6.25mg  now and increase dose starting this evening). PT/OT eval today to assess home needs. I am not totally sure how she cares for herself at home. At the very least I think she would benefit from a home health nurse visit at discharge to help sort out all her medicines. Will also ask cardiac rehab to see given NSTEMI. She will need a PCP upon discharge.  Signed, Melina Copa PA-C  The patient was seen, examined and discussed with Melina Copa, PA-C and I agree with the above.   79 year old female with known CAD, chronic combined systolic and diastolic CHF, CKD stage 4 who was admitted with an acute respiratory failure secondary to NSTEMI. The patient doesn't want to have cardiac cath inorder to avoid possible HD.  We will continue aggressive medical management for NSTEMI with ASA, Plavix, BB, no ACEI due to CKD stage 4. We will increase carvedilol for better BP control. Negative 2 lbs fluid balnce since yesterday. Minimal fluid overload on physical exam. No diuretics sec to CKD stage 4.  [name] 02/23/2014

## 2014-02-23 NOTE — Progress Notes (Signed)
Patient has consented to [facility] services by written consent.  Will collaborate with inpatient RNCM to assign a new PCP prior to discharge to home.  Also would benefit from disease and depression management.  Of note, [facility] Care Management services does not replace or interfere with any services that are arranged by inpatient case management or social work.  For additional questions or referrals please contact Corliss Blacker BSN RN [facility] Liaison at [phone].

## 2014-02-23 NOTE — Progress Notes (Signed)
CARDIAC REHAB PHASE I   PRE:  Rate/Rhythm: 65 SR    BP:     SaO2: 93 RA  MODE:  Ambulation: 100 ft   POST:  Rate/Rhythm: 70 SR    BP: sitting 145/50     SaO2: 93 RA  Pt initially refused ambulation but warmed up after we talked about her grandkids. Willing to walk but then refused RW, gait belt, or handheld assist. Pt preferred holding to railing in hall. Very unsteady on her feet, esp when not holding to rail. Discussed lack of safety with this method but pt continued to refuse RW during walk. SaO2 93 RA. Pt did st she had "a little" chest pain in bed and that it increased "a little" with walking. Denied CP after the walk. This is all she would elaborate. Pt requested a [employer] tuna sandwich and after I got it for her, she agreed to try RW tomorrow. Encouraged pt that this was a very good decision. Will f/u tomorrow after PT evaluates her. Pt declined ed or even materials about ed. ? Will she allow ed with family. [place], [place], ACSM 02/23/2014 3:26 PM

## 2014-02-24 DIAGNOSIS — I2510 Atherosclerotic heart disease of native coronary artery without angina pectoris: Secondary | ICD-10-CM

## 2014-02-24 LAB — BASIC METABOLIC PANEL
Anion gap: 14 (ref 5–15)
BUN: 59 mg/dL — AB (ref 6–23)
CALCIUM: 8.3 mg/dL — AB (ref 8.4–10.5)
CO2: 26 meq/L (ref 19–32)
Chloride: 98 mEq/L (ref 96–112)
Creatinine, Ser: 3.68 mg/dL — ABNORMAL HIGH (ref 0.50–1.10)
GFR calc Af Amer: 13 mL/min — ABNORMAL LOW (ref >90)
GFR calc non Af Amer: 11 mL/min — ABNORMAL LOW (ref >90)
GLUCOSE: 95 mg/dL (ref 70–99)
Potassium: 4.7 mEq/L (ref 3.7–5.3)
SODIUM: 138 meq/L (ref 137–147)

## 2014-02-24 LAB — CBC
HEMATOCRIT: 34.9 % — AB (ref 36.0–46.0)
Hemoglobin: 10.6 g/dL — ABNORMAL LOW (ref 12.0–15.0)
MCH: 25.1 pg — ABNORMAL LOW (ref 26.0–34.0)
MCHC: 30.4 g/dL (ref 30.0–36.0)
MCV: 82.7 fL (ref 78.0–100.0)
Platelets: 277 10*3/uL (ref 150–400)
RBC: 4.22 MIL/uL (ref 3.87–5.11)
RDW: 16.7 % — AB (ref 11.5–15.5)
WBC: 7.2 10*3/uL (ref 4.0–10.5)

## 2014-02-24 MED ORDER — ISOSORBIDE MONONITRATE ER 60 MG PO TB24: 60.0000 mg | ORAL_TABLET | Freq: Every day | ORAL | Status: DC

## 2014-02-24 MED ORDER — CARVEDILOL 25 MG PO TABS
25.0000 mg | ORAL_TABLET | Freq: Two times a day (BID) | ORAL | Status: DC
  Filled 2014-02-24 (×2): qty 1

## 2014-02-24 MED ORDER — CARVEDILOL 25 MG PO TABS: 25.0000 mg | ORAL_TABLET | Freq: Two times a day (BID) | ORAL | Status: DC

## 2014-02-24 MED ORDER — FUROSEMIDE 40 MG PO TABS: 40.0000 mg | ORAL_TABLET | Freq: Every day | ORAL | Status: DC | PRN

## 2014-02-24 MED ORDER — ENALAPRIL MALEATE 2.5 MG PO TABS
2.5000 mg | ORAL_TABLET | Freq: Every day | ORAL | Status: DC
Start: 2014-02-24 — End: 2014-02-24
  Filled 2014-02-24: qty 1

## 2014-02-24 NOTE — Progress Notes (Signed)
Pharmacist Heart Failure Core Measure Documentation  Assessment: [name] has an EF documented as 25-30% on 9/6 by echo.  Rationale: Heart failure patients with left ventricular systolic dysfunction (LVSD) and an EF < 40% should be prescribed an angiotensin converting enzyme inhibitor (ACEI) or angiotensin receptor blocker (ARB) at discharge unless a contraindication is documented in the medical record.  This patient is not currently on an ACEI or ARB for HF.  This note is being placed in the record in order to provide documentation that a contraindication to the use of these agents is present for this encounter.  ACE Inhibitor or Angiotensin Receptor Blocker is contraindicated (specify all that apply)  []   ACEI allergy AND ARB allergy []   Angioedema []   Moderate or severe aortic stenosis []   Hyperkalemia []   Hypotension []   Renal artery stenosis [x]   Worsening renal function, preexisting renal disease or dysfunction   [facility], Pharm.D., BCPS Clinical Pharmacist Pager: [phone] 02/24/2014 10:07 AM

## 2014-02-24 NOTE — Evaluation (Signed)
Occupational Therapy Evaluation Patient Details Name: [name] MRN: [id number] DOB: Dec 17, 1934 Today's Date: 02/24/2014    History of Present Illness Pt admitted with SOB, DOE and LE edema due to acute on chronic CHF.  Pt also with STEMI, HTN, CAD with CP, CKD, and chronic obstructive asthma.   Clinical Impression   Pt reports being independent with self care, cooking and cleaning prior to admission.  She presents with poor balance and decreased activity tolerance interfering with ability to perform ADL and ADL transfers.  She is adamant she does not want a RW or rollator stating family members who have passed away each had walkers and she doesn't want to be like them.  Educated pt on the benefits for energy conservation as well. Will follow acutely.    Follow Up Recommendations  Home health OT;Supervision/Assistance - 24 hour (may refuse)   Equipment Recommendations       Recommendations for Other Services       Precautions / Restrictions Precautions Precautions: Fall Restrictions Weight Bearing Restrictions: No      Mobility Bed Mobility Overal bed mobility: Modified Independent             General bed mobility comments: HOB up, no use of rail  Transfers Overall transfer level: Needs assistance Equipment used: 1 person hand held assist Transfers: Stand Pivot Transfers   Stand pivot transfers: Min assist            Balance Overall balance assessment: Needs assistance Sitting-balance support: Feet supported Sitting balance-Leahy Scale: Good       Standing balance-Leahy Scale: Poor                              ADL Overall ADL's : Needs assistance/impaired Eating/Feeding: Independent;Sitting   Grooming: Min guard;Wash/dry hands;Standing   Upper Body Bathing: Set up;Sitting   Lower Body Bathing: Sit to/from stand;Min guard   Upper Body Dressing : Set up;Sitting   Lower Body Dressing: Min guard;Sit to/from stand   Toilet  Transfer: Minimal assistance;Stand-pivot;BSC   Toileting- Water quality scientist and Hygiene: Min guard;Sit to/from stand         General ADL Comments: Pt able to donn socks at EOB.     Vision                     Perception     Praxis      Pertinent Vitals/Pain Pain Assessment: Faces Faces Pain Scale: Hurts little more Pain Location: stomach Pain Descriptors / Indicators: Aching Pain Intervention(s): Monitored during session     Hand Dominance Right   Extremity/Trunk Assessment Upper Extremity Assessment Upper Extremity Assessment: Overall WFL for tasks assessed (pt refused formal testing)   Lower Extremity Assessment Lower Extremity Assessment: Defer to PT evaluation   Cervical / Trunk Assessment Cervical / Trunk Assessment: Kyphotic   Communication Communication Communication: HOH   Cognition Arousal/Alertness: Awake/alert Behavior During Therapy:  (pt aggravated by current situation) Overall Cognitive Status: Within Functional Limits for tasks assessed                     General Comments       Exercises       Shoulder Instructions      Home Living Family/patient expects to be discharged to:: Private residence Living Arrangements: Children (multiple generations) Available Help at Discharge: Family;Available 24 hours/day Type of Home: House Home Access: Level entry  Home Layout: One level     Bathroom Shower/Tub: Occupational psychologist: Standard     Home Equipment: Bedside commode;Shower seat          Prior Functioning/Environment Level of Independence: Independent        Comments: pt cooks and cleans, has a hx of falls, furniture walks at home    OT Diagnosis: Generalized weakness;Acute pain   OT Problem List: Decreased strength;Decreased activity tolerance;Impaired balance (sitting and/or standing);Decreased safety awareness;Decreased knowledge of use of DME or AE;Cardiopulmonary status limiting  activity;Obesity;Pain   OT Treatment/Interventions: Self-care/ADL training;Energy conservation;Therapeutic activities;DME and/or AE instruction;Patient/family education;Balance training    OT Goals(Current goals can be found in the care plan section) Acute Rehab OT Goals Patient Stated Goal: go home, return to independence OT Goal Formulation: With patient Time For Goal Achievement: 03/03/14 Potential to Achieve Goals: Good ADL Goals Pt Will Perform Grooming: standing;with supervision Pt Will Perform Lower Body Bathing: with supervision;sit to/from stand Pt Will Perform Lower Body Dressing: with supervision;sit to/from stand Pt Will Transfer to Toilet: with supervision;ambulating;bedside commode Pt Will Perform Toileting - Clothing Manipulation and hygiene: sit to/from stand;with supervision Pt Will Perform Tub/Shower Transfer: Shower transfer;with supervision;rolling walker;ambulating;shower seat Additional ADL Goal #1: Pt will employ breathing techniques and energy conservation strategies in ADL and mobility with min cues.  OT Frequency: Min 2X/week   Barriers to D/C:            Co-evaluation              End of Session    Activity Tolerance: Patient limited by fatigue Patient left: in bed   Time: 1655-3748 OT Time Calculation (min): 34 min Charges:  OT General Charges $OT Visit: 1 Procedure OT Evaluation $Initial OT Evaluation Tier I: 1 Procedure OT Treatments $Self Care/Home Management : 8-22 mins G-Codes:    Malka So 02/24/2014, 9:40 AM [phone]

## 2014-02-24 NOTE — Evaluation (Signed)
Physical Therapy Evaluation Patient Details Name: [name] MRN: [id number] DOB: Feb 14, 1935 Today's Date: 02/24/2014   History of Present Illness  Pt admitted with SOB, DOE and LE edema due to acute on chronic CHF.  Pt also with STEMI, HTN, CAD with CP, CKD, and chronic obstructive asthma.  Clinical Impression  **Pt admitted with CHF, **. Pt currently with functional limitations due to the deficits listed below (see PT Problem List).  Pt will benefit from skilled PT to increase their independence and safety with mobility to allow discharge to the venue listed below.   Pt ambulated  62' with quad cane and min/guard assist for balance. She reluctantly agreed to use of cane, she prefers not to use one, but has unsteady gait and h/o 3 falls. HHPT and straight cane recommended. Will follow.  *    Follow Up Recommendations Home health PT    Equipment Recommendations  Cane    Recommendations for Other Services       Precautions / Restrictions Precautions Precautions: Fall Precaution Comments: pt reports h/o 3 falls (slipping on wet floor and tripping  on objects) Restrictions Weight Bearing Restrictions: No      Mobility  Bed Mobility Overal bed mobility: Modified Independent             General bed mobility comments: HOB up, no use of rail  Transfers Overall transfer level: Needs assistance Equipment used: Quad cane Transfers: Sit to/from Stand Sit to Stand: Min guard Stand pivot transfers: Min guard       General transfer comment: min guard for balance  Ambulation/Gait Ambulation/Gait assistance: Min guard Ambulation Distance (Feet): 70 Feet Assistive device: Quad cane Gait Pattern/deviations: Step-through pattern;Decreased stride length;Wide base of support   Gait velocity interpretation: <1.8 ft/sec, indicative of risk for recurrent falls General Gait Details: pt prefers to walk without AD, but agreed to trial of quad cane, when walking she used cane  and held onto handrail with other hand, unsteady gait  Stairs            Wheelchair Mobility    Modified Rankin (Stroke Patients Only)       Balance Overall balance assessment: Needs assistance Sitting-balance support: Feet supported Sitting balance-Leahy Scale: Good       Standing balance-Leahy Scale: Poor                               Pertinent Vitals/Pain Pain Assessment: Faces Faces Pain Scale: Hurts even more Pain Location: low back Pain Descriptors / Indicators: Aching Pain Intervention(s): Patient requesting pain meds-RN notified;Monitored during session    Home Living Family/patient expects to be discharged to:: Private residence Living Arrangements: Children (multiple generations) Available Help at Discharge: Family;Available 24 hours/day Type of Home: House Home Access: Level entry     Home Layout: One level Home Equipment: Bedside commode;Shower seat      Prior Function Level of Independence: Independent         Comments: pt cooks and cleans, has a hx of falls, furniture walks at home     Hand Dominance   Dominant Hand: Right    Extremity/Trunk Assessment   Upper Extremity Assessment: Overall WFL for tasks assessed (pt refused formal testing)           Lower Extremity Assessment: Overall WFL for tasks assessed;RLE deficits/detail;LLE deficits/detail      Cervical / Trunk Assessment: Kyphotic  Communication   Communication: HOH  Cognition Arousal/Alertness:  Awake/alert Behavior During Therapy:  (pt aggravated by current situation) Overall Cognitive Status: Within Functional Limits for tasks assessed                      General Comments      Exercises General Exercises - Lower Extremity Ankle Circles/Pumps: AROM;Both;5 reps;Seated Long Arc Quad: AROM;Both;5 reps;Seated Hip Flexion/Marching: AROM;Both;5 reps;Seated      Assessment/Plan    PT Assessment Patient needs continued PT services  PT  Diagnosis Difficulty walking;Generalized weakness   PT Problem List Decreased activity tolerance;Decreased balance;Decreased knowledge of use of DME;Decreased mobility;Pain  PT Treatment Interventions DME instruction;Gait training;Functional mobility training;Therapeutic activities;Patient/family education;Balance training;Therapeutic exercise   PT Goals (Current goals can be found in the Care Plan section) Acute Rehab PT Goals Patient Stated Goal: go home, return to independence PT Goal Formulation: With patient Time For Goal Achievement: 03/10/14 Potential to Achieve Goals: Fair    Frequency Min 3X/week   Barriers to discharge        Co-evaluation               End of Session Equipment Utilized During Treatment:  (pt refused gait belt) Activity Tolerance: Patient limited by fatigue Patient left: with call bell/phone within reach (on [facility] with call bell in reach) Nurse Communication: Mobility status         Time: 8828-0034 PT Time Calculation (min): 26 min   Charges:   PT Evaluation $Initial PT Evaluation Tier I: 1 Procedure PT Treatments $Gait Training: 8-22 mins $Therapeutic Exercise: 8-22 mins   PT G Codes:          Philomena Doheny 02/24/2014, 9:53 AM [phone]

## 2014-02-24 NOTE — Progress Notes (Addendum)
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 amLODipine  5 mg Oral Daily  . antiseptic oral rinse  7 mL Mouth Rinse BID  . aspirin  81 mg Oral Daily  . atorvastatin  20 mg Oral q1800  . carvedilol  12.5 mg Oral BID WC  . clopidogrel  75 mg Oral Daily  . enalapril  2.5 mg Oral Daily  . heparin subcutaneous  5,000 Units Subcutaneous 3 times per day  . hydrALAZINE  25 mg Oral 3 times per day  . isosorbide mononitrate  30 mg Oral Daily  . sodium chloride  3 mL Intravenous Q12H    OBJECTIVE  Filed Vitals:   02/23/14 0926 02/23/14 1353 02/23/14 2134 02/24/14 0452  BP: 153/86 121/44 124/40 153/60  Pulse: 70 65 65 76  Temp:  97.7 F (36.5 C) 98.1 F (36.7 C) 98.3 F (36.8 C)  TempSrc:  Oral Oral Oral  Resp: 18 20 18 20   Height:      Weight:    190 lb 12.8 oz (86.546 kg)  SpO2: 95% 96% 94% 92%    Intake/Output Summary (Last 24 hours) at 02/24/14 0935 Last data filed at 02/24/14 0800  Gross per 24 hour  Intake    820 ml  Output   1725 ml  Net   -905 ml   Filed Weights   02/22/14 1750 02/23/14 0710 02/24/14 0452  Weight: 189 lb 9.6 oz (86.002 kg) 188 lb 7.9 oz (85.5 kg) 190 lb 12.8 oz (86.546 kg)    PHYSICAL EXAM  General: Pleasant, NAD. Frail Neuro: Alert and oriented X 3. Moves all extremities spontaneously. Psych: Normal affect. HEENT:  Normal  Neck: Supple without bruits or JVD. Lungs:  Resp regular and unlabored, CTA. Heart: RRR no s3, s4, or murmurs. Abdomen: Soft,  non-tender, non-distended, BS + x 4.  Extremities: No clubbing, cyanosis or edema. DP/PT/Radials 2+ and equal bilaterally.  Accessory Clinical Findings  CBC  Recent Labs  02/23/14 0426 02/24/14 0758  WBC 6.2 7.2  HGB 10.5* 10.6*  HCT 34.3* 34.9*  MCV 80.3 82.7  PLT 251 387   Basic Metabolic Panel  Recent Labs  02/23/14 0426 02/24/14 0758  NA 138 138  K 4.0 4.7  CL 96 98  CO2 28 26  GLUCOSE 100* 95  BUN 49* 59*  CREATININE 3.26* 3.68*  CALCIUM 8.1* 8.3*    TELE NSR with HR 70-80s, no significant ventricular ectopy    ECG  NSR with HR 70s, TWI in anterolateral leads  Echocardiogram  02/20/2014 LV EF: 25% - 30%  ------------------------------------------------------------------- Indications: 428.0 CHF.  ------------------------------------------------------------------- Study Conclusions  - Left ventricle: The cavity size was moderately dilated. Wall thickness was increased in a pattern of moderate LVH. Systolic function was severely reduced. The estimated ejection fraction was in the range of 25% to 30%. Diffuse hypokinesis. Doppler parameters are consistent with abnormal left ventricular relaxation (grade 1 diastolic dysfunction). - Mitral valve: Calcified annulus. - Left atrium: The atrium was  severely dilated. - Right atrium: The atrium was mildly dilated.  Impressions:  - Severe global reduction in LV function; moderate LVH and LVE; grade 1 diastolic dysfunction.      Radiology/Studies  Dg Chest Port 1 View  02/20/2014   CLINICAL DATA:  Shortness of breath and chest pain  EXAM: PORTABLE CHEST - 1 VIEW  COMPARISON:  08/25/2012  FINDINGS: Technically limited study due to patient positioning. Shallow inspiration. Heart size and pulmonary vascularity appear increased. Diffuse interstitial changes in the lungs. No focal consolidation. No blunting of costophrenic angles. No pneumothorax.  IMPRESSION: Cardiac enlargement with mild vascular congestion  and interstitial edema.   Electronically Signed   By: Lucienne Capers M.D.   On: 02/20/2014 04:51    ASSESSMENT AND PLAN  1. Acute respiratory failure: see #2  2. Acute on chronic combined systolic and diastolic CHF  - EF decreased to 25-30%  - increase coreg to 25mg  as SBP in 120-160 range, no ACEI with AKI on CKD, continue hydralazine/nitrate combo for CHF.   - although patient does not want dialysis, ?if can still consider  40mg  PRN lasix on discharge in case she has recurrent swelling or SOB  - likely able to be discharged today.  3. Accelerated HTN  4. NSTEMI vs demand ischemia with underlying CAD  - she does not want cardiac cath if it could lead to dialysis  - plan for medical therapy with ASA, BB, statin and plavix  5. Acute on chronic renal insufficiency, stage IV  - patient does NOT want dialysis 6. H./o med noncompliance 7. Ongoing tobacco abuse  Signed, Woodward Ku Pager: [phone]   The patient was seen, examined and discussed with Almyra Deforest, PA-C and I agree with the above.   79 year old female with known CAD, chronic combined systolic and diastolic CHF, CKD stage 4 who was admitted with an acute respiratory failure secondary to NSTEMI. The patient doesn't want to have cardiac cath inorder to avoid possible HD.  We will continue aggressive medical management for NSTEMI with ASA, Plavix, BB, no ACEI due to CKD stage 4, but nitrates/hydralazine. We will increase carvedilol for better BP control to 25 mg PO BID. Negative 2 lbs fluid balnce again since yesterday. Minimal fluid overload on physical exam. No diuretics sec to CKD stage 4. BP - 160 this am, we will increase imdur to 60 mg po daily.  [name] 02/24/2014

## 2014-02-24 NOTE — Progress Notes (Signed)
Co-signed for Glean Hess, RN/BSN for medication administration, assessments, Vital signs, I's & O's, progress notes, care plans, and patient education. Tonny Branch, RN/BSN

## 2014-02-24 NOTE — Progress Notes (Signed)
D/C tele.  D/C IV.  D/C pt.  D/C instructions and home medications discussed with pt.  Pt. Denied any questions or concerns at this time.  Pt leaving via wheelchair and in no acute distress.

## 2014-02-24 NOTE — Discharge Instructions (Signed)
Heart Failure °Heart failure is a condition in which the heart has trouble pumping blood. This means your heart does not pump blood efficiently for your body to work well. In some cases of heart failure, fluid may back up into your lungs or you may have swelling (edema) in your lower legs. Heart failure is usually a long-term (chronic) condition. It is important for you to take good care of yourself and follow your health care provider's treatment plan. °CAUSES  °Some health conditions can cause heart failure. Those health conditions include: °· High blood pressure (hypertension). Hypertension causes the heart muscle to work harder than normal. When pressure in the blood vessels is high, the heart needs to pump (contract) with more force in order to circulate blood throughout the body. High blood pressure eventually causes the heart to become stiff and weak. °· Coronary artery disease (CAD). CAD is the buildup of cholesterol and fat (plaque) in the arteries of the heart. The blockage in the arteries deprives the heart muscle of oxygen and blood. This can cause chest pain and may lead to a heart attack. High blood pressure can also contribute to CAD. °· Heart attack (myocardial infarction). A heart attack occurs when one or more arteries in the heart become blocked. The loss of oxygen damages the muscle tissue of the heart. When this happens, part of the heart muscle dies. The injured tissue does not contract as well and weakens the heart's ability to pump blood. °· Abnormal heart valves. When the heart valves do not open and close properly, it can cause heart failure. This makes the heart muscle pump harder to keep the blood flowing. °· Heart muscle disease (cardiomyopathy or myocarditis). Heart muscle disease is damage to the heart muscle from a variety of causes. These can include drug or alcohol abuse, infections, or unknown reasons. These can increase the risk of heart failure. °· Lung disease. Lung disease  makes the heart work harder because the lungs do not work properly. This can cause a strain on the heart, leading it to fail. °· Diabetes. Diabetes increases the risk of heart failure. High blood sugar contributes to high fat (lipid) levels in the blood. Diabetes can also cause slow damage to tiny blood vessels that carry important nutrients to the heart muscle. When the heart does not get enough oxygen and food, it can cause the heart to become weak and stiff. This leads to a heart that does not contract efficiently. °· Other conditions can contribute to heart failure. These include abnormal heart rhythms, thyroid problems, and low blood counts (anemia). °Certain unhealthy behaviors can increase the risk of heart failure, including: °· Being overweight. °· Smoking or chewing tobacco. °· Eating foods high in fat and cholesterol. °· Abusing illicit drugs or alcohol. °· Lacking physical activity. °SYMPTOMS  °Heart failure symptoms may vary and can be hard to detect. Symptoms may include: °· Shortness of breath with activity, such as climbing stairs. °· Persistent cough. °· Swelling of the feet, ankles, legs, or abdomen. °· Unexplained weight gain. °· Difficulty breathing when lying flat (orthopnea). °· Waking from sleep because of the need to sit up and get more air. °· Rapid heartbeat. °· Fatigue and loss of energy. °· Feeling light-headed, dizzy, or close to fainting. °· Loss of appetite. °· Nausea. °· Increased urination during the night (nocturia). °DIAGNOSIS  °A diagnosis of heart failure is based on your history, symptoms, physical examination, and diagnostic tests. Diagnostic tests for heart failure may include: °·   Echocardiography. °· Electrocardiography. °· Chest X-ray. °· Blood tests. °· Exercise stress test. °· Cardiac angiography. °· Radionuclide scans. °TREATMENT  °Treatment is aimed at managing the symptoms of heart failure. Medicines, behavioral changes, or surgical intervention may be necessary to  treat heart failure. °· Medicines to help treat heart failure may include: °¨ Angiotensin-converting enzyme (ACE) inhibitors. This type of medicine blocks the effects of a blood protein called angiotensin-converting enzyme. ACE inhibitors relax (dilate) the blood vessels and help lower blood pressure. °¨ Angiotensin receptor blockers (ARBs). This type of medicine blocks the actions of a blood protein called angiotensin. Angiotensin receptor blockers dilate the blood vessels and help lower blood pressure. °¨ Water pills (diuretics). Diuretics cause the kidneys to remove salt and water from the blood. The extra fluid is removed through urination. This loss of extra fluid lowers the volume of blood the heart pumps. °¨ Beta blockers. These prevent the heart from beating too fast and improve heart muscle strength. °¨ Digitalis. This increases the force of the heartbeat. °· Healthy behavior changes include: °¨ Obtaining and maintaining a healthy weight. °¨ Stopping smoking or chewing tobacco. °¨ Eating heart-healthy foods. °¨ Limiting or avoiding alcohol. °¨ Stopping illicit drug use. °¨ Physical activity as directed by your health care provider. °· Surgical treatment for heart failure may include: °¨ A procedure to open blocked arteries, repair damaged heart valves, or remove damaged heart muscle tissue. °¨ A pacemaker to improve heart muscle function and control certain abnormal heart rhythms. °¨ An internal cardioverter defibrillator to treat certain serious abnormal heart rhythms. °¨ A left ventricular assist device (LVAD) to assist the pumping ability of the heart. °HOME CARE INSTRUCTIONS  °· Take medicines only as directed by your health care provider. Medicines are important in reducing the workload of your heart, slowing the progression of heart failure, and improving your symptoms. °¨ Do not stop taking your medicine unless directed by your health care provider. °¨ Do not skip any dose of medicine. °¨ Refill your  prescriptions before you run out of medicine. Your medicines are needed every day. °· Engage in moderate physical activity if directed by your health care provider. Moderate physical activity can benefit some people. The elderly and people with severe heart failure should consult with a health care provider for physical activity recommendations. °· Eat heart-healthy foods. Food choices should be free of trans fat and low in saturated fat, cholesterol, and salt (sodium). Healthy choices include fresh or frozen fruits and vegetables, fish, lean meats, legumes, fat-free or low-fat dairy products, and whole grain or high fiber foods. Talk to a dietitian to learn more about heart-healthy foods. °· Limit sodium if directed by your health care provider. Sodium restriction may reduce symptoms of heart failure in some people. Talk to a dietitian to learn more about heart-healthy seasonings. °· Use healthy cooking methods. Healthy cooking methods include roasting, grilling, broiling, baking, poaching, steaming, or stir-frying. Talk to a dietitian to learn more about healthy cooking methods. °· Limit fluids if directed by your health care provider. Fluid restriction may reduce symptoms of heart failure in some people. °· Weigh yourself every day. Daily weights are important in the early recognition of excess fluid. You should weigh yourself every morning after you urinate and before you eat breakfast. Wear the same amount of clothing each time you weigh yourself. Record your daily weight. Provide your health care provider with your weight record. °· Monitor and record your blood pressure if directed by your health care   provider.  Check your pulse if directed by your health care provider.  Lose weight if directed by your health care provider. Weight loss may reduce symptoms of heart failure in some people.  Stop smoking or chewing tobacco. Nicotine makes your heart work harder by causing your blood vessels to constrict.  Do not use nicotine gum or patches before talking to your health care provider.  Keep all follow-up visits as directed by your health care provider. This is important.  Limit alcohol intake to no more than 1 drink per day for nonpregnant women and 2 drinks per day for men. One drink equals 12 ounces of beer, 5 ounces of wine, or 1 ounces of hard liquor. Drinking more than that is harmful to your heart. Tell your health care provider if you drink alcohol several times a week. Talk with your health care provider about whether alcohol is safe for you. If your heart has already been damaged by alcohol or you have severe heart failure, drinking alcohol should be stopped completely.  Stop illicit drug use.  Stay up-to-date with immunizations. It is especially important to prevent respiratory infections through current pneumococcal and influenza immunizations.  Manage other health conditions such as hypertension, diabetes, thyroid disease, or abnormal heart rhythms as directed by your health care provider.  Learn to manage stress.  Plan rest periods when fatigued.  Learn strategies to manage high temperatures. If the weather is extremely hot:  Avoid vigorous physical activity.  Use air conditioning or fans or seek a cooler location.  Avoid caffeine and alcohol.  Wear loose-fitting, lightweight, and light-colored clothing.  Learn strategies to manage cold temperatures. If the weather is extremely cold:  Avoid vigorous physical activity.  Layer clothes.  Wear mittens or gloves, a hat, and a scarf when going outside.  Avoid alcohol.  Obtain ongoing education and support as needed.  Participate in or seek rehabilitation as needed to maintain or improve independence and quality of life. SEEK MEDICAL CARE IF:   Your weight increases by 03 lb/1.4 kg in 1 day or 05 lb/2.3 kg in a week.  You have increasing shortness of breath that is unusual for you.  You are unable to participate in  your usual physical activities.  You tire easily.  You cough more than normal, especially with physical activity.  You have any or more swelling in areas such as your hands, feet, ankles, or abdomen.  You are unable to sleep because it is hard to breathe.  You feel like your heart is beating fast (palpitations).  You become dizzy or light-headed upon standing up. SEEK IMMEDIATE MEDICAL CARE IF:   You have difficulty breathing.  There is a change in mental status such as decreased alertness or difficulty with concentration.  You have a pain or discomfort in your chest.  You have an episode of fainting (syncope). MAKE SURE YOU:   Understand these instructions.  Will watch your condition.  Will get help right away if you are not doing well or get worse. Document Released: 06/03/2005 Document Revised: 10/18/2013 Document Reviewed: 07/03/2012 [facility] Patient Information 2015 [place], [place]. This information is not intended to replace advice given to you by your health care provider. Make sure you discuss any questions you have with your health care provider.   Avoid diet with high salt content, avoid fluid intake for >2L in 24 hours. Please measure your weight every single morning and call cardiology if weight increase by more than 3 lbs overnight or 5  lbs in a single week.

## 2014-02-24 NOTE — Progress Notes (Signed)
Spoke with pt again. Pt willing to listen to small amounts of education re HF. Gave HF booklet and discussed signs and sx of fluid. Pt does not have a scale at home, notified CM of this. Discussed low sodium diet which pt sts she follows. Pt refused to discuss smoking cessation, sts she has smoked 60 years and has never had a problem with smoking and doesn't now. Did convince pt that she needs to use a RW and she requests one now. Notified CM. I had hoped to talk with son however he plans to pick her up at the door and get back to work. [place], ACSM 2:36 PM 02/24/2014

## 2014-02-24 NOTE — Discharge Summary (Signed)
Physician Discharge Summary  [name] MEQ:[id number] DOB: 09-17-1934 DOA: 02/20/2014  PCP: Adella Hare, MD  Admit date: 02/20/2014 Discharge date: 02/24/2014  Time spent: 40 minutes  Recommendations for Outpatient Follow-up:  1. Established with Dr. As PCP.  Follow up for management of HTN, CHF and medication  2. Home health PT- please monitor volume status, obtain daily weights.  Patient recommended to take 40 mg Lasix PO as needed daily if >/= 3 pounds weight gain.   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   Discharge Condition: Stable  Diet recommendation: heart health  Filed Weights   02/22/14 1750 02/23/14 0710 02/24/14 0452  Weight: 86.002 kg (189 lb 9.6 oz) 85.5 kg (188 lb 7.9 oz) 86.546 kg (190 lb 12.8 oz)    History of present illness:  79 y/o female with PMH of CKD stage IV, COPD, accelerated hypertension, acute on chronic combined systolic and diastolic CHF (EF decreased to 25-30%), CAD (stent in [place] 2001, cath in 2008 with possible balloon or stent), strokes (including hemorrhagic in 2012), prior GIB requiring transfusion, prior medication noncompliance, who presented to ED with worsening SOB and DOE and lower leg edema over the past week. She states she ran out of some her medications. She was found hypoxemic with O2 sats at 80%, and was placed on 100% O2, with improvement in respiratory status. Her BNP [place] 34048.0, and her chest X-Ray reveals pulmonary edema. She was administered 80 mg of IV Lasix X1. EKG with evidence of NSTEMI, which was managed medically because patient/family declined cardiac  catheterization.   Hospital Course:   Diastolic CHF, acute on chronic  - Patient diuresed with IV Lasix and appears more euvolemic.  Negative 2 lbs fluid balance since admission; net I/O: negative 6 liters.  Clinically, does not appear fluid overloaded, and is able to ambulate in hall without SOB.m  2D Echo-Severe global reduction in LV function; moderate LVH and LVE; grade 1 diastolic dysfunction.  -Physical therapy recommends use of cane when ambulating due to history of recurrent falls. -cardiology recommends on discharge, lasix 40 mg PO q daily/PRN for weight gain or leg swelling.  NSTEMI  -patient was treated medially with  aspirin, beta blocker, statin, and plavix.  She refused heart catheterization because of potential of leading to dialysis, and she does not want dialysis. Was place on ACEI but due to her renal insufficiency, she was placed on imdur/hydralazine.    -Patient is established with PCP, Dr.  Mare Ferrari, and appointment is set up for 03/17/14. -On discharge- continue aspirin, lipitor, and carvedilol.  Hypertension, benign essential, labile  -BP is stable on discharge.   Patient is noncompliant with home mediations, and continues to smoke- she has been advised to stop smoking and take keep up with her medications at home.  -cardiology has recommended to continue amlodipine 5 mg daily and increase Norvasc to 6.25 BID -follow up with PCP for management of HTN  CAD/Chest pain  -chest pain resolved on discharge.  Initially placed on IV NTG drip, ASA.    -On discharge, continue aspirin and ranolazine.  CKD stage IV  -stable on discharge.  Due to renal insufficiency, ACEI was stopped.    Anemia of chronic disease  -Likely due to chronic kidney disease; hemoglobin stable on discharge; no active/history of bleeding.   Chronic obstructive asthma, unspecified  -No SOB, wheezing- on 2L on [place] sat 96%. continue Symbicort as needed on discharge   Procedures:  None    Consultations:  Cardiology  Discharge Exam: Filed Vitals:   02/24/14 1105  BP: 143/50  Pulse: 63  Temp:   Resp:      Exam General: chronically ill appearing white female in NAD.  HEENT: normocephalic, atraumatic.  Pupils symmetrical.  Moist mucosa Cardiovascular: Regular rate and rhythm.  No murmurs, rubs, or gallops. Respiratory: Clear to auscultate bilaterally.  No rhonchi or crepitations. Abdomen: Soft nontender bowel sounds present. No guarding or rigidity.  Musculoskeletal: symmetrical in appearance, no edema noted Psychiatric: Appears normal.  Neurologic: Alert awake oriented to time place and person, and is excited that she is discharged today.   Discharge Instructions You were cared for by a hospitalist during your hospital stay. If you have any questions about your discharge medications or the care you received while you were in the hospital after you are discharged, you can call the unit and asked to speak with the hospitalist on call if the hospitalist that took care of you is not available. Once you are discharged, your primary care physician will handle any further medical issues. Please note that NO REFILLS for any discharge medications will be authorized once you are discharged, as it is imperative that you return to your primary care physician (or establish a relationship with a primary care physician if you do not have one) for your aftercare needs so that they can reassess your need for medications and monitor your lab values.      Discharge Instructions   (HEART FAILURE PATIENTS) Call MD:  Anytime you have any of the following symptoms: 1) 3 pound weight gain in 24 hours or 5 pounds in 1 week 2) shortness of breath, with or without a dry hacking cough 3) swelling in the hands, feet or stomach 4) if you have to sleep on extra pillows at night in order to breathe.    Complete by:  As directed      Call MD for:  difficulty breathing, headache or visual  disturbances    Complete by:  As directed      Call MD for:  extreme fatigue    Complete by:  As directed      Call MD for:  hives    Complete by:  As directed      Call MD for:  persistant dizziness or light-headedness    Complete by:  As directed      Call MD for:  persistant nausea and vomiting    Complete by:  As directed      Call MD for:  redness, tenderness, or signs of infection (pain, swelling, redness, odor or green/yellow discharge around incision site)    Complete by:  As directed      Call MD for:  severe uncontrolled pain    Complete by:  As directed      Call MD for:  temperature >100.4    Complete by:  As directed      Diet - low sodium heart healthy    Complete by:  As directed      Increase activity slowly    Complete by:  As directed  Medication List    STOP taking these medications       enalapril 5 MG tablet  Commonly known as:  VASOTEC     isosorbide dinitrate 30 MG tablet  Commonly known as:  ISORDIL      TAKE these medications       amLODipine 5 MG tablet  Commonly known as:  NORVASC  Take 1 by mouth daily     aspirin 81 MG chewable tablet  Chew 1 tablet (81 mg total) by mouth daily.     atorvastatin 20 MG tablet  Commonly known as:  LIPITOR  Take 1 tablet (20 mg total) by mouth daily at 6 PM.     budesonide-formoterol 80-4.5 MCG/ACT inhaler  Commonly known as:  SYMBICORT  Inhale 2 puffs into the lungs 2 (two) times daily as needed (shortness of breath).     carvedilol 25 MG tablet  Commonly known as:  COREG  Take 1 tablet (25 mg total) by mouth 2 (two) times daily with a meal.     furosemide 40 MG tablet  Commonly known as:  LASIX  Take 1 tablet (40 mg total) by mouth daily as needed.     isosorbide mononitrate 60 MG 24 hr tablet  Commonly known as:  IMDUR  Take 1 tablet (60 mg total) by mouth daily.  Start taking on:  02/25/2014     ranolazine 500 MG 12 hr tablet  Commonly known as:  RANEXA  Take 1 tablet (500 mg  total) by mouth 2 (two) times daily.       No Known Allergies Follow-up Information   Follow up with Lynne Logan, MD. (Initial 1st appointment with  Primary Care Doctor,  Dr. Nancy Fetter 03/16/2014 at 9:15am.  Located at the intersection of [address].  Please take ID or Drivers License, [employer], and co-pay listed on insurance card.  )    Specialty:  Family Medicine   Contact information:   [place], [place] [address] [phone]       Follow up with Darlin Coco, MD On 03/17/2014. (10:45am)    Specialty:  Cardiology   Contact information:   [address] [place] [address] [phone]        The results of significant diagnostics from this hospitalization (including imaging, microbiology, ancillary and laboratory) are listed below for reference.    Significant Diagnostic Studies: Dg Chest Port 1 View  02/20/2014   CLINICAL DATA:  Shortness of breath and chest pain  EXAM: PORTABLE CHEST - 1 VIEW  COMPARISON:  08/25/2012  FINDINGS: Technically limited study due to patient positioning. Shallow inspiration. Heart size and pulmonary vascularity appear increased. Diffuse interstitial changes in the lungs. No focal consolidation. No blunting of costophrenic angles. No pneumothorax.  IMPRESSION: Cardiac enlargement with mild vascular congestion and interstitial edema.   Electronically Signed   By: Lucienne Capers M.D.   On: 02/20/2014 04:51    Microbiology: Recent Results (from the past 240 hour(s))  MRSA PCR SCREENING     Status: None   Collection Time    02/20/14 10:25 AM      Result Value Ref Range Status   MRSA by PCR NEGATIVE  NEGATIVE Final   Comment:            The GeneXpert MRSA Assay (FDA     approved for NASAL specimens     only), is one component of a     comprehensive MRSA  colonization     surveillance program. It is not     intended to diagnose MRSA     infection nor to guide or     monitor treatment for      MRSA infections.     Labs: Basic Metabolic Panel:  Recent Labs Lab 02/20/14 0434 02/21/14 0210 02/22/14 0210 02/23/14 0426 02/24/14 0758  NA 146 143 137 138 138  K 4.3 4.2 3.7 4.0 4.7  CL 104 101 95* 96 98  CO2 27 31 29 28 26   GLUCOSE 208* 107* 121* 100* 95  BUN 46* 45* 45* 49* 59*  CREATININE 3.15* 3.30* 3.04* 3.26* 3.68*  CALCIUM 7.7* 7.3* 7.4* 8.1* 8.3*   Liver Function Tests:  Recent Labs Lab 02/20/14 1018  AST 20  ALT 8  ALKPHOS 63  BILITOT 0.2*  PROT 6.6  ALBUMIN 3.2*   No results found for this basename: LIPASE, AMYLASE,  in the last 168 hours No results found for this basename: AMMONIA,  in the last 168 hours CBC:  Recent Labs Lab 02/20/14 0434 02/21/14 0210 02/22/14 0210 02/23/14 0426 02/24/14 0758  WBC 12.0* 8.5 7.5 6.2 7.2  HGB 11.2* 10.2* 9.9* 10.5* 10.6*  HCT 37.2 33.5* 32.1* 34.3* 34.9*  MCV 84.0 81.7 80.7 80.3 82.7  PLT 267 260 227 251 277   Cardiac Enzymes:  Recent Labs Lab 02/20/14 0715 02/20/14 1240 02/20/14 1831  TROPONINI 2.62* 6.24* 6.11*   BNP: BNP (last 3 results)  Recent Labs  02/20/14 0434  PROBNP 34048.0*   CBG: No results found for this basename: GLUCAP,  in the last 168 hours     Signed:  Kelvin Cellar PA-C  [facility] 02/24/2014, 11:43 AM  Addendum    I personally saw and evaluated patient on 02/24/2014. Plan was discussed with cardiology who recommended discharging her on as needed Lasix 40 mg by mouth daily when necessary for 3 pound weight gain.  There is concerned with her kidney function for scheduling daily Lasix. Instructions were written for home health services to monitor volume status. Prior to discharge home health services were set up for home RN for medication management, PT at home aide. Patient will followup with cardiology in the outpatient setting.

## 2014-02-24 NOTE — Progress Notes (Signed)
UR completed Alba Perillo K. Parris Signer, RN, BSN, [place], CCM  02/24/2014 5:12 PM

## 2014-02-25 ENCOUNTER — Other Ambulatory Visit: Payer: Self-pay

## 2014-02-25 MED ORDER — ISOSORBIDE MONONITRATE ER 60 MG PO TB24: 60.0000 mg | ORAL_TABLET | Freq: Every day | ORAL | Status: DC

## 2014-02-25 MED ORDER — AMLODIPINE BESYLATE 5 MG PO TABS: 5.0000 mg | ORAL_TABLET | Freq: Every day | ORAL | Status: DC

## 2014-02-25 MED ORDER — RANOLAZINE ER 500 MG PO TB12: 500.0000 mg | ORAL_TABLET | Freq: Two times a day (BID) | ORAL | Status: DC

## 2014-03-02 ENCOUNTER — Telehealth: Payer: Self-pay | Admitting: Cardiology

## 2014-03-02 NOTE — Telephone Encounter (Signed)
Discussed with  Dr. Mare Ferrari and will continue to monitor and call back with any changes

## 2014-03-02 NOTE — Telephone Encounter (Signed)
New message     Pt is due to have a hosp follow up 03-17-14.  Her heart rate is in the fifties.  Should she be seen sooner?

## 2014-03-02 NOTE — Telephone Encounter (Signed)
Spoke with [facility] nurse, patient heart rate 54 blood pressure 130's/60's and O2 sat 93-95 % on room air.  Patient asymptomatic. Today was first visit with patient. Taking Lasix daily, ordered prn but has swelling daily. Will forward to  Dr. Mare Ferrari for review.

## 2014-03-03 NOTE — Telephone Encounter (Signed)
[name], tried to call patient and she was not accepting calls

## 2014-03-17 ENCOUNTER — Ambulatory Visit (INDEPENDENT_AMBULATORY_CARE_PROVIDER_SITE_OTHER): Payer: Medicare Other | Admitting: Cardiology

## 2014-03-17 ENCOUNTER — Encounter: Payer: Self-pay | Admitting: Cardiology

## 2014-03-17 VITALS — BP 108/62 | HR 56 | Ht 61.0 in | Wt 185.0 lb

## 2014-03-17 DIAGNOSIS — I10 Essential (primary) hypertension: Secondary | ICD-10-CM

## 2014-03-17 DIAGNOSIS — R079 Chest pain, unspecified: Secondary | ICD-10-CM

## 2014-03-17 MED ORDER — NITROGLYCERIN 0.4 MG SL SUBL: 0.4000 mg | SUBLINGUAL_TABLET | SUBLINGUAL | Status: DC | PRN

## 2014-03-17 NOTE — Patient Instructions (Addendum)
Will refilled your NTG   Your physician wants you to follow-up in: 4 month ov/ekg You will receive a reminder letter in the mail two months in advance. If you don't receive a letter, please call our office to schedule the follow-up appointment.   Your physician recommends that you continue on your current medications as directed. Please refer to the Current Medication list given to you today.

## 2014-03-17 NOTE — Progress Notes (Signed)
[name] Date of Birth:  Oct 24, 1934 [name] [address] [place], [place]  [address] [phone]        Fax   [phone]   History of Present Illness: This pleasant 79 year old Caucasian female is seen for a posthospital office visit.  She was recently hospitalized for acute on chronic systolic congestive heart failure.  She was hospitalized on 02/20/14 and discharged on 02/24/14.  She was admitted with a non-STEMI.  She did not undergo cardiac catheterization because of chronic renal insufficiency.  She was treated medically.  She responded to diuretics.  She has a history of known ischemic heart disease.  In 2001 she underwent a stent while living in [place].  She was recathed in 2008 and had possible balloon or stent.  She has a past history of strokes including a hemorrhagic stroke in 2012.  She has a history of prior medication noncompliance and she continues to smoke against advice.  On admission her pro BNP was elevated at 34,000 and her chest x-ray revealed pulmonary edema.  She had a two-dimensional echocardiogram which showed severe global reduction in LV function with moderate LVH and with grade 1 diastolic dysfunction.  She refused heart catheterization because of the potential of leading to dialysis and she does not want dialysis.  She was discharged on aspirin Lipitor and carvedilol and Lasix.  She is also on ranolazine.  She is not on ACE inhibitor because of renal insufficiency. Since leaving the hospital she has been stable.  She has had no further chest discomfort and her shortness of breath has improved.  She still sleeps on 2 pillows.  She has weak pedal pulses but denies claudication.  She is relatively sedentary. Her social history reveals that she lives with her 78 year old son who himself has stents. She presently is smoking about half a pack of cigarettes a day and is trying to quit smoking.  Current Outpatient Prescriptions  Medication  Sig Dispense Refill  . amLODipine (NORVASC) 5 MG tablet Take 1 tablet (5 mg total) by mouth daily.  30 tablet  0  . aspirin 81 MG chewable tablet Chew 1 tablet (81 mg total) by mouth daily.      Marland Kitchen atorvastatin (LIPITOR) 20 MG tablet Take 1 tablet (20 mg total) by mouth daily at 6 PM.  30 tablet  11  . budesonide-formoterol (SYMBICORT) 80-4.5 MCG/ACT inhaler Inhale 2 puffs into the lungs 2 (two) times daily as needed (shortness of breath).      . carvedilol (COREG) 25 MG tablet Take 1 tablet (25 mg total) by mouth 2 (two) times daily with a meal.  60 tablet  0  . furosemide (LASIX) 40 MG tablet Take 1 tablet (40 mg total) by mouth daily as needed.  30 tablet  0  . isosorbide mononitrate (IMDUR) 60 MG 24 hr tablet Take 1 tablet (60 mg total) by mouth daily.  30 tablet  0  . nitroGLYCERIN (NITROSTAT) 0.4 MG SL tablet Place 1 tablet (0.4 mg total) under the tongue every 5 (five) minutes as needed for chest pain.  25 tablet  prn  . ranolazine (RANEXA) 500 MG 12 hr tablet Take 1 tablet (500 mg total) by mouth 2 (two) times daily.  60 tablet  0   No current facility-administered medications for this visit.    No Known Allergies  Patient Active Problem List   Diagnosis Date Noted  . Acute systolic congestive heart failure, NYHA class 4 02/22/2014  .  NSTEMI (non-ST elevated myocardial infarction) 02/22/2014  . 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 CHF exacerbation 02/20/2014  . Diastolic CHF, acute on chronic 02/20/2014  . Chronic obstructive asthma, unspecified 09/13/2012  . Renal dysfunction- Gd 4 08/29/2012  . Hypokalemia 08/29/2012  . Acute exacerbation of CHF (congestive heart failure) 08/25/2012  . HYPERTENSION, BENIGN ESSENTIAL, LABILE 07/24/2010  . CAD 07/24/2010  . CEREBROVASCULAR ACCIDENT 07/24/2010  . GASTROINTESTINAL HEMORRHAGE 07/24/2010  . NEPHROLITHIASIS 07/24/2010  . OSTEOARTHRITIS,  GENERALIZED 07/24/2010  . PEPTIC ULCER DISEASE, HX OF 07/24/2010    History  Smoking status  . Current Every Day Smoker  . Types: Cigarettes  Smokeless tobacco  . Never Used    History  Alcohol Use No    Family History  Problem Relation Age of Onset  . Diabetes Mother   . Hypertension Mother   . Diabetes Sister   . Cancer Son     breast  . Diabetes Sister     Review of Systems: Constitutional: no fever chills diaphoresis or fatigue or change in weight.  Head and neck: no hearing loss, no epistaxis, no photophobia or visual disturbance. Respiratory: No cough, positive for chronic dyspnea at rest and with exertion  Cardiovascular: No chest pain peripheral edema, palpitations. Gastrointestinal: No abdominal distention, no abdominal pain, no change in bowel habits hematochezia or melena. Genitourinary: No dysuria, no frequency, no urgency, no nocturia. Musculoskeletal:No arthralgias, no back pain, no gait disturbance or myalgias. Neurological: No dizziness, no headaches, no numbness, no seizures, no syncope, no weakness, no tremors. Hematologic: No lymphadenopathy, no easy bruising. Psychiatric: No confusion, no hallucinations, no sleep disturbance.    Physical Exam: Filed Vitals:   03/17/14 1126  BP: 108/62  Pulse: 56  The patient appears to be in no distress.  Her clothes smell of cigarette smoke.  Head and neck exam reveals that the pupils are equal and reactive.  The extraocular movements are full.  There is no scleral icterus.  Mouth and pharynx are benign.  No lymphadenopathy.  No carotid bruits.  The jugular venous pressure is normal.  Thyroid is not enlarged or tender.  Chest is clear to percussion and auscultation.  No rales or rhonchi.  Expansion of the chest is symmetrical.  Breath sounds are very distant.  Heart reveals no abnormal lift or heave.  First and second heart sounds are normal.  There is no murmur gallop rub or click.  Heart sounds are very  distant.  The abdomen is soft and nontender.  Bowel sounds are normoactive.  There is no hepatosplenomegaly or mass.  There are no abdominal bruits.  Extremities reveal no phlebitis or edema.  Pedal pulses are weak but present.  There is no cyanosis or clubbing.  Neurologic exam is normal strength and no lateralizing weakness.  No sensory deficits.  Integument reveals no rash  EKG shows sinus bradycardia with left bundle branch block and secondary ST-T wave changes.  Since prior tracing of 02/20/14, heart rate is slower  Assessment / Plan: 1.  Acute on chronic systolic heart failure with ejection fraction 25-30% 2. ischemic heart disease with recent non-STEMI treated medically 3. chronic renal insufficiency stage IV 4. COPD with ongoing cigarette abuse 5. essential hypertension  Disposition: Continue current medication.  We urged her to quit smoking altogether.  We refilled her prior prescription for sublingual nitroglycerin for her to have on hand.  Recheck in 4 months  for office visit and EKG.  Continue close followup with her PCP, Dr. Nancy Fetter

## 2014-03-21 ENCOUNTER — Telehealth: Payer: Self-pay | Admitting: Cardiology

## 2014-03-21 MED ORDER — RANOLAZINE ER 500 MG PO TB12: 500.0000 mg | ORAL_TABLET | Freq: Two times a day (BID) | ORAL | Status: DC

## 2014-03-21 NOTE — Telephone Encounter (Signed)
Spoke with pharmacy and they were unaware of Ranexa being d/c but patient did need refills. Updated Rx for 1 year. Left message Rx sent to pharmacy

## 2014-03-21 NOTE — Telephone Encounter (Signed)
Patient seen 03/17/14

## 2014-03-21 NOTE — Telephone Encounter (Signed)
New message      Pt said ranexa is being discontinued and her pharmacy does not carry it anymore.  What else can she take?

## 2014-03-24 ENCOUNTER — Other Ambulatory Visit: Payer: Self-pay

## 2014-03-24 MED ORDER — ISOSORBIDE MONONITRATE ER 60 MG PO TB24: 60.0000 mg | ORAL_TABLET | Freq: Every day | ORAL | Status: DC

## 2014-03-24 MED ORDER — AMLODIPINE BESYLATE 5 MG PO TABS: 5.0000 mg | ORAL_TABLET | Freq: Every day | ORAL | Status: DC

## 2014-04-04 ENCOUNTER — Other Ambulatory Visit: Payer: Self-pay

## 2014-04-04 MED ORDER — FUROSEMIDE 40 MG PO TABS: 40.0000 mg | ORAL_TABLET | Freq: Every day | ORAL | Status: AC | PRN

## 2014-04-04 MED ORDER — ATORVASTATIN CALCIUM 20 MG PO TABS: 20.0000 mg | ORAL_TABLET | Freq: Every day | ORAL | Status: DC

## 2014-04-04 MED ORDER — RANOLAZINE ER 500 MG PO TB12: 500.0000 mg | ORAL_TABLET | Freq: Two times a day (BID) | ORAL | Status: DC

## 2014-04-04 MED ORDER — CARVEDILOL 25 MG PO TABS: 25.0000 mg | ORAL_TABLET | Freq: Two times a day (BID) | ORAL | Status: DC

## 2014-04-04 MED ORDER — ISOSORBIDE MONONITRATE ER 60 MG PO TB24: 60.0000 mg | ORAL_TABLET | Freq: Every day | ORAL | Status: DC

## 2014-10-10 ENCOUNTER — Ambulatory Visit: Payer: Self-pay | Admitting: Cardiology

## 2015-08-01 ENCOUNTER — Emergency Department (HOSPITAL_COMMUNITY): Payer: Medicare Other

## 2015-08-01 ENCOUNTER — Inpatient Hospital Stay (HOSPITAL_COMMUNITY)
Admission: EM | Admit: 2015-08-01 | Discharge: 2015-08-07 | DRG: 280 | Disposition: A | Discharge status: Hospice | Payer: Medicare Other | Attending: Internal Medicine | Admitting: Internal Medicine

## 2015-08-01 ENCOUNTER — Encounter (HOSPITAL_COMMUNITY): Payer: Self-pay | Admitting: Emergency Medicine

## 2015-08-01 DIAGNOSIS — Z7189 Other specified counseling: Secondary | ICD-10-CM | POA: Insufficient documentation

## 2015-08-01 DIAGNOSIS — N184 Chronic kidney disease, stage 4 (severe): Secondary | ICD-10-CM | POA: Diagnosis present

## 2015-08-01 DIAGNOSIS — I5040 Unspecified combined systolic (congestive) and diastolic (congestive) heart failure: Secondary | ICD-10-CM

## 2015-08-01 DIAGNOSIS — I252 Old myocardial infarction: Secondary | ICD-10-CM

## 2015-08-01 DIAGNOSIS — J189 Pneumonia, unspecified organism: Secondary | ICD-10-CM | POA: Diagnosis not present

## 2015-08-01 DIAGNOSIS — Z9114 Patient's other noncompliance with medication regimen: Secondary | ICD-10-CM

## 2015-08-01 DIAGNOSIS — N179 Acute kidney failure, unspecified: Secondary | ICD-10-CM | POA: Diagnosis not present

## 2015-08-01 DIAGNOSIS — Z515 Encounter for palliative care: Secondary | ICD-10-CM | POA: Insufficient documentation

## 2015-08-01 DIAGNOSIS — F17210 Nicotine dependence, cigarettes, uncomplicated: Secondary | ICD-10-CM | POA: Diagnosis present

## 2015-08-01 DIAGNOSIS — I1310 Hypertensive heart and chronic kidney disease without heart failure, with stage 1 through stage 4 chronic kidney disease, or unspecified chronic kidney disease: Secondary | ICD-10-CM | POA: Diagnosis present

## 2015-08-01 DIAGNOSIS — Z9119 Patient's noncompliance with other medical treatment and regimen: Secondary | ICD-10-CM

## 2015-08-01 DIAGNOSIS — T502X5A Adverse effect of carbonic-anhydrase inhibitors, benzothiadiazides and other diuretics, initial encounter: Secondary | ICD-10-CM | POA: Diagnosis not present

## 2015-08-01 DIAGNOSIS — R7989 Other specified abnormal findings of blood chemistry: Secondary | ICD-10-CM | POA: Diagnosis not present

## 2015-08-01 DIAGNOSIS — K219 Gastro-esophageal reflux disease without esophagitis: Secondary | ICD-10-CM | POA: Diagnosis present

## 2015-08-01 DIAGNOSIS — Z85820 Personal history of malignant melanoma of skin: Secondary | ICD-10-CM

## 2015-08-01 DIAGNOSIS — Z833 Family history of diabetes mellitus: Secondary | ICD-10-CM | POA: Diagnosis not present

## 2015-08-01 DIAGNOSIS — Z8249 Family history of ischemic heart disease and other diseases of the circulatory system: Secondary | ICD-10-CM | POA: Diagnosis not present

## 2015-08-01 DIAGNOSIS — J1000 Influenza due to other identified influenza virus with unspecified type of pneumonia: Secondary | ICD-10-CM | POA: Diagnosis present

## 2015-08-01 DIAGNOSIS — I161 Hypertensive emergency: Secondary | ICD-10-CM | POA: Diagnosis present

## 2015-08-01 DIAGNOSIS — I255 Ischemic cardiomyopathy: Secondary | ICD-10-CM | POA: Diagnosis present

## 2015-08-01 DIAGNOSIS — R739 Hyperglycemia, unspecified: Secondary | ICD-10-CM | POA: Diagnosis present

## 2015-08-01 DIAGNOSIS — I130 Hypertensive heart and chronic kidney disease with heart failure and stage 1 through stage 4 chronic kidney disease, or unspecified chronic kidney disease: Secondary | ICD-10-CM | POA: Diagnosis not present

## 2015-08-01 DIAGNOSIS — Z8673 Personal history of transient ischemic attack (TIA), and cerebral infarction without residual deficits: Secondary | ICD-10-CM

## 2015-08-01 DIAGNOSIS — N289 Disorder of kidney and ureter, unspecified: Secondary | ICD-10-CM

## 2015-08-01 DIAGNOSIS — I5043 Acute on chronic combined systolic (congestive) and diastolic (congestive) heart failure: Secondary | ICD-10-CM | POA: Insufficient documentation

## 2015-08-01 DIAGNOSIS — I10 Essential (primary) hypertension: Secondary | ICD-10-CM | POA: Diagnosis not present

## 2015-08-01 DIAGNOSIS — I509 Heart failure, unspecified: Secondary | ICD-10-CM | POA: Diagnosis not present

## 2015-08-01 DIAGNOSIS — R197 Diarrhea, unspecified: Secondary | ICD-10-CM | POA: Diagnosis not present

## 2015-08-01 DIAGNOSIS — I214 Non-ST elevation (NSTEMI) myocardial infarction: Secondary | ICD-10-CM | POA: Diagnosis present

## 2015-08-01 DIAGNOSIS — I2510 Atherosclerotic heart disease of native coronary artery without angina pectoris: Secondary | ICD-10-CM | POA: Diagnosis present

## 2015-08-01 DIAGNOSIS — E869 Volume depletion, unspecified: Secondary | ICD-10-CM | POA: Diagnosis not present

## 2015-08-01 DIAGNOSIS — J440 Chronic obstructive pulmonary disease with acute lower respiratory infection: Secondary | ICD-10-CM | POA: Diagnosis present

## 2015-08-01 DIAGNOSIS — Z66 Do not resuscitate: Secondary | ICD-10-CM | POA: Diagnosis present

## 2015-08-01 DIAGNOSIS — I160 Hypertensive urgency: Secondary | ICD-10-CM | POA: Diagnosis present

## 2015-08-01 DIAGNOSIS — I5021 Acute systolic (congestive) heart failure: Secondary | ICD-10-CM | POA: Diagnosis present

## 2015-08-01 DIAGNOSIS — Z87442 Personal history of urinary calculi: Secondary | ICD-10-CM | POA: Diagnosis not present

## 2015-08-01 LAB — BASIC METABOLIC PANEL
Anion gap: 15 (ref 5–15)
BUN: 32 mg/dL — AB (ref 6–20)
CHLORIDE: 104 mmol/L (ref 101–111)
CO2: 20 mmol/L — ABNORMAL LOW (ref 22–32)
Calcium: 8.4 mg/dL — ABNORMAL LOW (ref 8.9–10.3)
Creatinine, Ser: 2.84 mg/dL — ABNORMAL HIGH (ref 0.44–1.00)
GFR calc Af Amer: 17 mL/min — ABNORMAL LOW (ref >60)
GFR calc non Af Amer: 15 mL/min — ABNORMAL LOW (ref >60)
GLUCOSE: 128 mg/dL — AB (ref 65–99)
POTASSIUM: 3.7 mmol/L (ref 3.5–5.1)
SODIUM: 139 mmol/L (ref 135–145)

## 2015-08-01 LAB — DIFFERENTIAL
BASOS PCT: 0 %
Basophils Absolute: 0 10*3/uL (ref 0.0–0.1)
EOS ABS: 0 10*3/uL (ref 0.0–0.7)
Eosinophils Relative: 0 %
Lymphocytes Relative: 13 %
Lymphs Abs: 0.9 10*3/uL (ref 0.7–4.0)
MONO ABS: 0.6 10*3/uL (ref 0.1–1.0)
Monocytes Relative: 9 %
NEUTROS ABS: 5.3 10*3/uL (ref 1.7–7.7)
Neutrophils Relative %: 78 %

## 2015-08-01 LAB — CBC
HCT: 40.2 % (ref 36.0–46.0)
HEMOGLOBIN: 12.1 g/dL (ref 12.0–15.0)
MCH: 26.5 pg (ref 26.0–34.0)
MCHC: 30.1 g/dL (ref 30.0–36.0)
MCV: 88 fL (ref 78.0–100.0)
Platelets: 189 10*3/uL (ref 150–400)
RBC: 4.57 MIL/uL (ref 3.87–5.11)
RDW: 16.5 % — ABNORMAL HIGH (ref 11.5–15.5)
WBC: 7 10*3/uL (ref 4.0–10.5)

## 2015-08-01 LAB — I-STAT TROPONIN, ED: TROPONIN I, POC: 0.7 ng/mL — AB (ref 0.00–0.08)

## 2015-08-01 LAB — BRAIN NATRIURETIC PEPTIDE: B NATRIURETIC PEPTIDE 5: 4106.8 pg/mL — AB (ref 0.0–100.0)

## 2015-08-01 LAB — TROPONIN I: TROPONIN I: 0.55 ng/mL — AB (ref <0.031)

## 2015-08-01 MED ORDER — HEPARIN (PORCINE) IN NACL 100-0.45 UNIT/ML-% IJ SOLN
1050.0000 [IU]/h | INTRAMUSCULAR | Status: DC
  Administered 2015-08-01: 900 [IU]/h via INTRAVENOUS
  Filled 2015-08-01: qty 250

## 2015-08-01 MED ORDER — CARVEDILOL 25 MG PO TABS
25.0000 mg | ORAL_TABLET | Freq: Two times a day (BID) | ORAL | Status: DC
  Administered 2015-08-02 – 2015-08-07 (×9): 25 mg via ORAL
  Filled 2015-08-01: qty 1
  Filled 2015-08-01: qty 2
  Filled 2015-08-01 (×9): qty 1

## 2015-08-01 MED ORDER — ATORVASTATIN CALCIUM 20 MG PO TABS
20.0000 mg | ORAL_TABLET | Freq: Every day | ORAL | Status: DC
  Administered 2015-08-02 – 2015-08-06 (×5): 20 mg via ORAL
  Filled 2015-08-01 (×5): qty 1

## 2015-08-01 MED ORDER — AMLODIPINE BESYLATE 5 MG PO TABS
5.0000 mg | ORAL_TABLET | Freq: Every day | ORAL | Status: DC
  Administered 2015-08-02 – 2015-08-07 (×5): 5 mg via ORAL
  Filled 2015-08-01 (×5): qty 1
  Filled 2015-08-01: qty 2

## 2015-08-01 MED ORDER — ASPIRIN 81 MG PO CHEW
324.0000 mg | CHEWABLE_TABLET | Freq: Once | ORAL | Status: AC
  Administered 2015-08-01: 324 mg via ORAL
  Filled 2015-08-01: qty 4

## 2015-08-01 MED ORDER — NITROGLYCERIN 0.4 MG SL SUBL: 0.4000 mg | SUBLINGUAL_TABLET | SUBLINGUAL | Status: DC | PRN

## 2015-08-01 MED ORDER — ONDANSETRON HCL 4 MG/2ML IJ SOLN: 4.0000 mg | Freq: Four times a day (QID) | INTRAMUSCULAR | Status: DC | PRN

## 2015-08-01 MED ORDER — ACETAMINOPHEN 325 MG PO TABS: 650.0000 mg | ORAL_TABLET | ORAL | Status: DC | PRN

## 2015-08-01 MED ORDER — FUROSEMIDE 10 MG/ML IJ SOLN
40.0000 mg | Freq: Once | INTRAMUSCULAR | Status: AC
  Administered 2015-08-01: 40 mg via INTRAVENOUS
  Filled 2015-08-01: qty 4

## 2015-08-01 MED ORDER — FUROSEMIDE 20 MG PO TABS: 40.0000 mg | ORAL_TABLET | Freq: Every day | ORAL | Status: DC

## 2015-08-01 MED ORDER — ASPIRIN 300 MG RE SUPP: 300.0000 mg | RECTAL | Status: AC

## 2015-08-01 MED ORDER — ASPIRIN EC 81 MG PO TBEC
81.0000 mg | DELAYED_RELEASE_TABLET | Freq: Every day | ORAL | Status: DC
  Administered 2015-08-02 – 2015-08-07 (×6): 81 mg via ORAL
  Filled 2015-08-01 (×6): qty 1

## 2015-08-01 MED ORDER — DEXTROSE 5 % IV SOLN
500.0000 mg | Freq: Once | INTRAVENOUS | Status: AC
  Administered 2015-08-01: 500 mg via INTRAVENOUS
  Filled 2015-08-01: qty 500

## 2015-08-01 MED ORDER — ASPIRIN 81 MG PO CHEW: 81.0000 mg | CHEWABLE_TABLET | Freq: Every day | ORAL | Status: DC

## 2015-08-01 MED ORDER — HEPARIN BOLUS VIA INFUSION
4000.0000 [IU] | Freq: Once | INTRAVENOUS | Status: AC
  Administered 2015-08-01: 4000 [IU] via INTRAVENOUS
  Filled 2015-08-01: qty 4000

## 2015-08-01 MED ORDER — DEXTROSE 5 % IV SOLN
1.0000 g | INTRAVENOUS | Status: DC
  Administered 2015-08-02 – 2015-08-06 (×5): 1 g via INTRAVENOUS
  Filled 2015-08-01 (×7): qty 10

## 2015-08-01 MED ORDER — AZITHROMYCIN 250 MG PO TABS
250.0000 mg | ORAL_TABLET | Freq: Every day | ORAL | Status: DC
  Administered 2015-08-02 – 2015-08-07 (×6): 250 mg via ORAL
  Filled 2015-08-01 (×7): qty 1

## 2015-08-01 MED ORDER — ASPIRIN 81 MG PO CHEW: 324.0000 mg | CHEWABLE_TABLET | ORAL | Status: AC

## 2015-08-01 MED ORDER — DEXTROSE 5 % IV SOLN
1.0000 g | Freq: Once | INTRAVENOUS | Status: AC
  Administered 2015-08-01: 1 g via INTRAVENOUS
  Filled 2015-08-01: qty 10

## 2015-08-01 MED ORDER — ISOSORBIDE MONONITRATE ER 60 MG PO TB24
60.0000 mg | ORAL_TABLET | Freq: Every day | ORAL | Status: DC
Start: 2015-08-02 — End: 2015-08-07
  Administered 2015-08-02 – 2015-08-07 (×5): 60 mg via ORAL
  Filled 2015-08-01 (×6): qty 1

## 2015-08-01 MED ORDER — RANOLAZINE ER 500 MG PO TB12
500.0000 mg | ORAL_TABLET | Freq: Two times a day (BID) | ORAL | Status: DC
  Administered 2015-08-02 – 2015-08-07 (×11): 500 mg via ORAL
  Filled 2015-08-01 (×13): qty 1

## 2015-08-01 NOTE — H&P (Addendum)
[facility] History and Physical  [name] [id number] DOB: Sep 22, 1934 DOA: 08/01/2015  Referring physician: EDP PCP: Lynne Logan, MD   Chief Complaint: SOB   HPI: [name] is a 81 y.o. female with h/o CAD, MI, CKD stage 4, CVA (ischemic in 2008, hemorrhagic in 2012), HTN.  Patient presents to ED with c/o SOB, chest pain, increasing dyspnea.  Symptoms have been ongoing for past couple of days, worse today, worse when lying down.  Cough productive of small amount of yellow sputum.  No fevers nor chills at home.  Taking a lot of NTG at home for chest pain (5 SL NTG), currently pain free but does get pain with minimal activity (walking to bathroom).  Breathing improved modestly with neb treatments in ED.  Work up significant for positive troponin of 0.55, EKG is ischemic.  Patient likely has at least a Type 2 NSTEMI.  Cardiology is coming to consult.  Patient last had NSTEMI in 2015, at that time due to advanced CKD, decision made not to cath patient because she wouldn't want to go on dialysis she states.  Today patient seems to be at a minimum of the same mindset.  Actually she says she dosent ever want another cath again "cant go through all that again".  On further questioning, it becomes clear she wants to be a DNR as well, which all things considered is perfectly reasonable I feel.  Review of Systems: Systems reviewed.  As above, otherwise negative  Past Medical History  Diagnosis Date  . Myocardial infarction ([place])   . Hypertension   . Chronic combined systolic and diastolic CHF (congestive heart failure) ([place])     a. EF 45-50% in 0000000. b. Mixed systolic/diastolic - EF [id number] by echo 2014, 35% by nuc.  Marland Kitchen Anemia   . Stroke [facility])     a. 2008: acute stroke in left centrum ovale. 06/2010: hemorrhagic stroke.  Marland Kitchen GERD (gastroesophageal reflux disease)   . Headache(784.0)   . Arthritis   . Depression   . CKD (chronic kidney disease), stage IV ([place]) 08/29/2012   . Cancer ([facility])     scalp  . Nephrolithiasis   . Hx of peptic ulcer   . Osteoarthritis, generalized   . Gastrointestinal hemorrhage     a. Pt reports she has h/o this many years ago, requiring 2 units of blood. Thinks it was in [place] but records not available. She does not know why this happened.  Marland Kitchen CAD (coronary artery disease)   . H/O medication noncompliance     a. Per notes, due to cost.   . CAD (coronary artery disease)     a. Stents in [facility] 2001 and cath 2008 with possible balloon or stent at [facility] (not available in [place]).   . Hemorrhoid     a. Mild hemorrhoidal bleeding per previous DC note.  Marland Kitchen COPD (chronic obstructive pulmonary disease) [facility])    Past Surgical History  Procedure Laterality Date  . Appendectomy    . Tonsillectomy    . Abdominal hysterectomy    . Cholecystectomy    . Eye surgery    . Colon surgery    . Colonoscopy  07/18/10  . Melanoma excision  1989    distal right LE    Social History:  reports that she has been smoking Cigarettes.  She has never used smokeless tobacco. She reports that she does not drink alcohol or use illicit drugs.  No Known Allergies  Family History  Problem  Relation Age of Onset  . Diabetes Mother   . Hypertension Mother   . Diabetes Sister   . Cancer Son     breast  . Diabetes Sister      Prior to Admission medications   Medication Sig Start Date End Date Taking? Authorizing Provider  amLODipine (NORVASC) 5 MG tablet Take 1 tablet (5 mg total) by mouth daily. 03/24/14  Yes Darlin Coco, MD  aspirin 81 MG chewable tablet Chew 1 tablet (81 mg total) by mouth daily. 09/02/12  Yes Neena Rhymes, MD  atorvastatin (LIPITOR) 20 MG tablet Take 1 tablet (20 mg total) by mouth daily at 6 PM. 04/04/14  Yes Darlin Coco, MD  carvedilol (COREG) 25 MG tablet Take 1 tablet (25 mg total) by mouth 2 (two) times daily with a meal. 04/04/14  Yes Darlin Coco, MD  furosemide (LASIX) 40 MG tablet Take 40 mg by  mouth daily.   Yes Historical Provider, MD  isosorbide mononitrate (IMDUR) 60 MG 24 hr tablet Take 1 tablet (60 mg total) by mouth daily. 04/04/14  Yes Darlin Coco, MD  nitroGLYCERIN (NITROSTAT) 0.4 MG SL tablet Place 1 tablet (0.4 mg total) under the tongue every 5 (five) minutes as needed for chest pain. 03/17/14  Yes Darlin Coco, MD  ranolazine (RANEXA) 500 MG 12 hr tablet Take 1 tablet (500 mg total) by mouth 2 (two) times daily. 04/04/14  Yes Darlin Coco, MD   Physical Exam: Filed Vitals:   08/01/15 2100 08/01/15 2115  BP: 157/95 152/87  Pulse: 86 86  Temp:    Resp:  33    BP 152/87 mmHg  Pulse 86  Temp(Src) 97.4 F (36.3 C) (Oral)  Resp 33  Ht 5\' 5"  (1.651 m)  Wt 77.111 kg (170 lb)  BMI 28.29 kg/m2  SpO2 99%  General Appearance:    Alert, oriented, no distress, appears stated age  Head:    Normocephalic, atraumatic  Eyes:    PERRL, EOMI, sclera non-icteric        Nose:   Nares without drainage or epistaxis. Mucosa, turbinates normal  Throat:   Moist mucous membranes. Oropharynx without erythema or exudate.  Neck:   Supple. No carotid bruits.  No thyromegaly.  No lymphadenopathy.   Back:     No CVA tenderness, no spinal tenderness  Lungs:     Clear to auscultation bilaterally, without wheezes, rhonchi or rales  Chest wall:    No tenderness to palpitation  Heart:    Regular rate and rhythm without murmurs, gallops, rubs  Abdomen:     Soft, non-tender, nondistended, normal bowel sounds, no organomegaly  Genitalia:    deferred  Rectal:    deferred  Extremities:   No clubbing, cyanosis or edema.  Pulses:   2+ and symmetric all extremities  Skin:   Skin color, texture, turgor normal, no rashes or lesions  Lymph nodes:   Cervical, supraclavicular, and axillary nodes normal  Neurologic:   CNII-XII intact. Normal strength, sensation and reflexes      throughout    Labs on Admission:  Basic Metabolic Panel:  Recent Labs Lab 08/01/15 1922  NA 139  K 3.7   CL 104  CO2 20*  GLUCOSE 128*  BUN 32*  CREATININE 2.84*  CALCIUM 8.4*   Liver Function Tests: No results for input(s): AST, ALT, ALKPHOS, BILITOT, PROT, ALBUMIN in the last 168 hours. No results for input(s): LIPASE, AMYLASE in the last 168 hours. No results for input(s): AMMONIA in the  last 168 hours. CBC:  Recent Labs Lab 08/01/15 1922  WBC 7.0  NEUTROABS 5.3  HGB 12.1  HCT 40.2  MCV 88.0  PLT 189   Cardiac Enzymes:  Recent Labs Lab 08/01/15 2008  TROPONINI 0.55*    BNP (last 3 results) No results for input(s): PROBNP in the last 8760 hours. CBG: No results for input(s): GLUCAP in the last 168 hours.  Radiological Exams on Admission: Dg Chest Port 1 View  08/01/2015  CLINICAL DATA:  Short of breath EXAM: PORTABLE CHEST 1 VIEW COMPARISON:  02/20/2014 FINDINGS: The heart is moderately enlarged. Aorta remains tortuous. A focal opacity in the right upper lung zone has developed. Pulmonary vascularity is within normal limits. No pneumothorax or pleural effusion. IMPRESSION: New focal right upper lobe opacity has developed. Followup PA and lateral chest X-ray is recommended in 3-4 weeks following trial of antibiotic therapy to ensure resolution and exclude underlying malignancy. Electronically Signed   By: Marybelle Killings M.D.   On: 08/01/2015 19:33    EKG: Independently reviewed.  Assessment/Plan Principal Problem:   NSTEMI (non-ST elevated myocardial infarction) ([place]) Active Problems:   Acute systolic congestive heart failure, NYHA class 4 (H[facility])   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, NYHA class 4 and CKD stage 4 ([place])   CAP (community acquired pneumonia)   1. NSTEMI - possibly with CHF exacerbation associated 1. Plan medical management at this point, patient pretty much flat out says "I wont go through that again" when cath discussed.  Also would be high risk to end up on dialysis with CKD stage 4 at  baseline. 2. Cards consult 3. Heparin gtt 4. NTG if needed 5. Continue home statin 6. ASA 7. Serial trops 8. Tele monitor 9. Continue home BP meds and PO lasix for now, will defer adjustment on the meds to cards. 10. Starting NTG ointment 11. 2d echo ordered 2. HTN - For now continue home BP meds and lasix dosing, patient still hypertensive despite #1 in ED (BP 159/77 most recently) 3. ? CAP - 1. Seen on x ray but no fever, no WBC, etc 2. Will leave on rocephin and vanc started in ED for the moment 3. Adult wheeze protocol for PRN nebs 4. CKD stage 4 - 1. Daily BMP 2. Looks to be about baseline for the moment 3. Probably not a good candidate for heart cath because of this.  (sounds like she dosent want / refuses one anyhow)  Cards consulted.  Spoke with Dr. Eula Fried: need to get BP down, use NTG gtt if paste dosent work, and add more lasix depending on response to first dose.  Code Status: DNR  Family Communication: No family in room, not able to get a-hold of son on either home or cell number left Disposition Plan: Admit to inpatient   Time spent: 69 min  GARDNER, JARED M. [facility] Pager [phone]  If 7AM-7PM, please contact the day team taking care of the patient [email or web address] Password [facility] 08/01/2015, 11:08 PM

## 2015-08-01 NOTE — ED Notes (Signed)
From home via [facility] for SOB and cough X 2days, CP, wheezing pta, 90% on RA per EMS, 250/120, 94 NSR  10mg  Alb 0.5 mg Atrovent 22g LFA

## 2015-08-01 NOTE — Consult Note (Signed)
ANTICOAGULATION CONSULT NOTE - Initial Consult  Pharmacy Consult for heparin Indication: chest pain/ACS  No Known Allergies  Patient Measurements: Height: 5\' 5"  (165.1 cm) Weight: 170 lb (77.111 kg) IBW/kg (Calculated) : 57 Heparin Dosing Weight: 73 kg  Vital Signs: Temp: 97.4 F (36.3 C) (02/14 1910) Temp Source: Oral (02/14 1910) BP: 164/112 mmHg (02/14 1945) Pulse Rate: 93 (02/14 1945)  Labs:  Recent Labs  08/01/15 1922  HGB 12.1  HCT 40.2  PLT 189    CrCl cannot be calculated (Patient has no serum creatinine result on file.).  Assessment: 81 yo female presents w/ SOB and cough. BP elevated to 210/97 and troponins elevated to 0.7. No anticoag PTA reported. CBC stable.  Goal of Therapy:  Heparin level 0.3-0.7 units/ml Monitor platelets by anticoagulation protocol: Yes   Plan:  Give 4000 units bolus x 1 Start heparin infusion at 900 units/hr Check anti-Xa level in 8 hours and daily while on heparin Continue to monitor H&H and platelets  Joya San, PharmD Clinical Pharmacy Resident Pager # [phone] 08/01/2015 8:04 PM

## 2015-08-01 NOTE — ED Provider Notes (Signed)
CSN: [id number]     Arrival date & time 08/01/15  1900 History   First MD Initiated Contact with Patient 08/01/15 1941     Chief Complaint  Patient presents with  . Respiratory Distress     (Consider location/radiation/quality/duration/timing/severity/associated sxs/prior Treatment) The history is provided by the patient.   81 year old female was brought in by ambulance because of cough and difficulty breathing and chest pain. She is a very difficult historian but apparently has had cough for several days with some increasing dyspnea. Dyspnea is worse when she lays down. Cough is productive of a small amount of yellowish sputum. Today, she has had recurrent episodes of chest pain and states that she took 5 nitroglycerin at home. She is currently pain-free. She denies fever, chills, sweats. She denies nausea or vomiting. She was treated with albuterol and ipratropium in the [place] with modest improvement.  Past Medical History  Diagnosis Date  . Myocardial infarction ([place])   . Hypertension   . Chronic combined systolic and diastolic CHF (congestive heart failure) ([place])     a. EF 45-50% in 0000000. b. Mixed systolic/diastolic - EF [id number] by echo 2014, 35% by nuc.  Marland Kitchen Anemia   . Stroke [facility])     a. 2008: acute stroke in left centrum ovale. 06/2010: hemorrhagic stroke.  Marland Kitchen GERD (gastroesophageal reflux disease)   . Headache(784.0)   . Arthritis   . Depression   . CKD (chronic kidney disease), stage IV ([place]) 08/29/2012  . Cancer (HCC)     scalp  . Nephrolithiasis   . Hx of peptic ulcer   . Osteoarthritis, generalized   . Gastrointestinal hemorrhage     a. Pt reports she has h/o this many years ago, requiring 2 units of blood. Thinks it was in [place] but records not available. She does not know why this happened.  Marland Kitchen CAD (coronary artery disease)   . H/O medication noncompliance     a. Per notes, due to cost.   . CAD (coronary artery disease)     a. Stents in [facility] 2001 and cath 2008 with  possible balloon or stent at [facility] (not available in [place]).   . Hemorrhoid     a. Mild hemorrhoidal bleeding per previous DC note.  Marland Kitchen COPD (chronic obstructive pulmonary disease) [facility])    Past Surgical History  Procedure Laterality Date  . Appendectomy    . Tonsillectomy    . Abdominal hysterectomy    . Cholecystectomy    . Eye surgery    . Colon surgery    . Colonoscopy  07/18/10  . Melanoma excision  1989    distal right LE    Family History  Problem Relation Age of Onset  . Diabetes Mother   . Hypertension Mother   . Diabetes Sister   . Cancer Son     breast  . Diabetes Sister    Social History  Substance Use Topics  . Smoking status: Current Every Day Smoker    Types: Cigarettes  . Smokeless tobacco: Never Used  . Alcohol Use: No   OB History    No data available     Review of Systems  All other systems reviewed and are negative.     Allergies  Review of patient's allergies indicates no known allergies.  Home Medications   Prior to Admission medications   Medication Sig Start Date End Date Taking? Authorizing Provider  amLODipine (NORVASC) 5 MG tablet Take 1 tablet (5 mg total) by mouth daily.  03/24/14   Darlin Coco, MD  aspirin 81 MG chewable tablet Chew 1 tablet (81 mg total) by mouth daily. 09/02/12   Neena Rhymes, MD  atorvastatin (LIPITOR) 20 MG tablet Take 1 tablet (20 mg total) by mouth daily at 6 PM. 04/04/14   Darlin Coco, MD  budesonide-formoterol [facility]) 80-4.5 MCG/ACT inhaler Inhale 2 puffs into the lungs 2 (two) times daily as needed (shortness of breath).    Historical Provider, MD  carvedilol (COREG) 25 MG tablet Take 1 tablet (25 mg total) by mouth 2 (two) times daily with a meal. 04/04/14   Darlin Coco, MD  isosorbide mononitrate (IMDUR) 60 MG 24 hr tablet Take 1 tablet (60 mg total) by mouth daily. 04/04/14   Darlin Coco, MD  nitroGLYCERIN (NITROSTAT) 0.4 MG SL tablet Place 1 tablet (0.4 mg total)  under the tongue every 5 (five) minutes as needed for chest pain. 03/17/14   Darlin Coco, MD  ranolazine (RANEXA) 500 MG 12 hr tablet Take 1 tablet (500 mg total) by mouth 2 (two) times daily. 04/04/14   Darlin Coco, MD   BP 210/82 mmHg  Pulse 92  Temp(Src) 97.4 F (36.3 C) (Oral)  Resp 23  SpO2 93% Physical Exam  Nursing note and vitals reviewed.  81 year old female, resting comfortably and in no acute distress. Vital signs are significant for hypertension and tachypnea. Oxygen saturation is 93%, which is normal. Although she is not in respiratory distress, she is unable to complete a full sentence without stopping to take another breath. Head is normocephalic and atraumatic. PERRLA, EOMI. Oropharynx is clear. Neck is nontender and supple without adenopathy or JVD. Back is nontender and there is no CVA tenderness. Lungs have distant breath sounds with fine rales heard at the right base. There are coarse expiratory rhonchi with some high-pitched wheezes present as well. Chest is nontender. Heart has regular rate and rhythm without murmur. Abdomen is soft, flat, nontender without masses or hepatosplenomegaly and peristalsis is normoactive. Extremities have 1+ edema with venous stasis changes present, full range of motion is present. Skin is warm and dry without rash. Neurologic: Mental status is normal, cranial nerves are intact, there are no motor or sensory deficits.  ED Course  Procedures (including critical care time) Labs Review Results for orders placed or performed during the hospital encounter of 08/01/15  CBC  Result Value Ref Range   WBC 7.0 4.0 - 10.5 K/uL   RBC 4.57 3.87 - 5.11 MIL/uL   Hemoglobin 12.1 12.0 - 15.0 g/dL   HCT 40.2 36.0 - 46.0 %   MCV 88.0 78.0 - 100.0 fL   MCH 26.5 26.0 - 34.0 pg   MCHC 30.1 30.0 - 36.0 g/dL   RDW 16.5 (H) 11.5 - 15.5 %   Platelets 189 150 - 400 K/uL  Basic metabolic panel  Result Value Ref Range   Sodium 139 135 - 145  mmol/L   Potassium 3.7 3.5 - 5.1 mmol/L   Chloride 104 101 - 111 mmol/L   CO2 20 (L) 22 - 32 mmol/L   Glucose, Bld 128 (H) 65 - 99 mg/dL   BUN 32 (H) 6 - 20 mg/dL   Creatinine, Ser 2.84 (H) 0.44 - 1.00 mg/dL   Calcium 8.4 (L) 8.9 - 10.3 mg/dL   GFR calc non Af Amer 15 (L) >60 mL/min   GFR calc Af Amer 17 (L) >60 mL/min   Anion gap 15 5 - 15  Brain natriuretic peptide (only with dyspnea)  Result Value Ref Range   B Natriuretic Peptide 4106.8 (H) 0.0 - 100.0 pg/mL  Troponin I  Result Value Ref Range   Troponin I 0.55 (HH) <0.031 ng/mL  Differential  Result Value Ref Range   Neutrophils Relative % 78 %   Neutro Abs 5.3 1.7 - 7.7 K/uL   Lymphocytes Relative 13 %   Lymphs Abs 0.9 0.7 - 4.0 K/uL   Monocytes Relative 9 %   Monocytes Absolute 0.6 0.1 - 1.0 K/uL   Eosinophils Relative 0 %   Eosinophils Absolute 0.0 0.0 - 0.7 K/uL   Basophils Relative 0 %   Basophils Absolute 0.0 0.0 - 0.1 K/uL  I-stat troponin, ED (not at [facility])  Result Value Ref Range   Troponin i, poc 0.70 (HH) 0.00 - 0.08 ng/mL   Comment NOTIFIED PHYSICIAN    Comment 3           Imaging Review Dg Chest Port 1 View  08/01/2015  CLINICAL DATA:  Short of breath EXAM: PORTABLE CHEST 1 VIEW COMPARISON:  02/20/2014 FINDINGS: The heart is moderately enlarged. Aorta remains tortuous. A focal opacity in the right upper lung zone has developed. Pulmonary vascularity is within normal limits. No pneumothorax or pleural effusion. IMPRESSION: New focal right upper lobe opacity has developed. Followup PA and lateral chest X-ray is recommended in 3-4 weeks following trial of antibiotic therapy to ensure resolution and exclude underlying malignancy. Electronically Signed   By: Marybelle Killings M.D.   On: 08/01/2015 19:33   I have personally reviewed and evaluated these images and lab results as part of my medical decision-making.   EKG Interpretation   Date/Time:  Tuesday August 01 2015 19:32:34 EST Ventricular Rate:  94 PR  Interval:  182 QRS Duration: 125 QT Interval:  414 QTC Calculation: 518 R Axis:   30 Text Interpretation:  Sinus rhythm LVH with secondary repolarization  abnormality ST depression, consider ischemia, diffuse lds Prolonged QT  interval When compared with ECG of 02/22/2014, QT has lengthened Confirmed  by [facility]  MD, Narciso Stoutenburg ([id number]) on 08/01/2015 7:36:00 PM      CRITICAL CARE Performed by: [id number] Total critical care time: 50 minutes Critical care time was exclusive of separately billable procedures and treating other patients. Critical care was necessary to treat or prevent imminent or life-threatening deterioration. Critical care was time spent personally by me on the following activities: development of treatment plan with patient and/or surrogate as well as nursing, discussions with consultants, evaluation of patient's response to treatment, examination of patient, obtaining history from patient or surrogate, ordering and performing treatments and interventions, ordering and review of laboratory studies, ordering and review of radiographic studies, pulse oximetry and re-evaluation of patient's condition.  MDM   Final diagnoses:  NSTEMI (non-ST elevated myocardial infarction) ([place])  Community acquired pneumonia  Renal insufficiency  Acute on chronic combined systolic and diastolic heart failure (HCC)    Chest pain with elevated troponin-apparent non-STEMI. Cough with infiltrates seen on x-ray will need to be treated as community-acquired pneumonia. I reviewed the x-ray and I'm not convinced that there actually is an infiltrate there, but with her symptoms, treatment for pneumonia is appropriate. Old records are reviewed and she had a similar hospital admission 1.5 years ago. She has not followed up with cardiology since then. She started on heparin and given oral aspirin and is started on antibiotics of ceftriaxone and azithromycin.  BNP has come back significantly elevated. Renal  function is about at baseline. She is  given a dose of furosemide. She continues to be pain-free in the ED. Case is discussed with Dr. Alcario Drought of [facility] who agrees to admit the patient.  Delora Fuel, MD [id number]

## 2015-08-02 ENCOUNTER — Inpatient Hospital Stay (HOSPITAL_COMMUNITY): Payer: Medicare Other

## 2015-08-02 DIAGNOSIS — J189 Pneumonia, unspecified organism: Secondary | ICD-10-CM

## 2015-08-02 DIAGNOSIS — I214 Non-ST elevation (NSTEMI) myocardial infarction: Principal | ICD-10-CM

## 2015-08-02 DIAGNOSIS — R7989 Other specified abnormal findings of blood chemistry: Secondary | ICD-10-CM

## 2015-08-02 DIAGNOSIS — I5021 Acute systolic (congestive) heart failure: Secondary | ICD-10-CM

## 2015-08-02 DIAGNOSIS — I509 Heart failure, unspecified: Secondary | ICD-10-CM

## 2015-08-02 DIAGNOSIS — I130 Hypertensive heart and chronic kidney disease with heart failure and stage 1 through stage 4 chronic kidney disease, or unspecified chronic kidney disease: Secondary | ICD-10-CM

## 2015-08-02 DIAGNOSIS — I5040 Unspecified combined systolic (congestive) and diastolic (congestive) heart failure: Secondary | ICD-10-CM

## 2015-08-02 DIAGNOSIS — N184 Chronic kidney disease, stage 4 (severe): Secondary | ICD-10-CM

## 2015-08-02 DIAGNOSIS — I10 Essential (primary) hypertension: Secondary | ICD-10-CM

## 2015-08-02 LAB — CBC
HEMATOCRIT: 38.6 % (ref 36.0–46.0)
Hemoglobin: 11.6 g/dL — ABNORMAL LOW (ref 12.0–15.0)
MCH: 26.5 pg (ref 26.0–34.0)
MCHC: 30.1 g/dL (ref 30.0–36.0)
MCV: 88.3 fL (ref 78.0–100.0)
PLATELETS: 186 10*3/uL (ref 150–400)
RBC: 4.37 MIL/uL (ref 3.87–5.11)
RDW: 16.4 % — AB (ref 11.5–15.5)
WBC: 6.9 10*3/uL (ref 4.0–10.5)

## 2015-08-02 LAB — BASIC METABOLIC PANEL
Anion gap: 14 (ref 5–15)
BUN: 34 mg/dL — AB (ref 6–20)
CALCIUM: 8 mg/dL — AB (ref 8.9–10.3)
CO2: 21 mmol/L — AB (ref 22–32)
Chloride: 103 mmol/L (ref 101–111)
Creatinine, Ser: 3.06 mg/dL — ABNORMAL HIGH (ref 0.44–1.00)
GFR calc Af Amer: 16 mL/min — ABNORMAL LOW (ref >60)
GFR, EST NON AFRICAN AMERICAN: 14 mL/min — AB (ref >60)
GLUCOSE: 103 mg/dL — AB (ref 65–99)
Potassium: 4 mmol/L (ref 3.5–5.1)
Sodium: 138 mmol/L (ref 135–145)

## 2015-08-02 LAB — TROPONIN I
TROPONIN I: 0.73 ng/mL — AB (ref <0.031)
TROPONIN I: 0.81 ng/mL — AB (ref <0.031)
TROPONIN I: 0.84 ng/mL — AB (ref <0.031)

## 2015-08-02 LAB — MRSA PCR SCREENING: MRSA BY PCR: NEGATIVE

## 2015-08-02 LAB — HEPARIN LEVEL (UNFRACTIONATED)
HEPARIN UNFRACTIONATED: 0.16 [IU]/mL — AB (ref 0.30–0.70)
HEPARIN UNFRACTIONATED: 0.59 [IU]/mL (ref 0.30–0.70)
Heparin Unfractionated: 0.23 IU/mL — ABNORMAL LOW (ref 0.30–0.70)

## 2015-08-02 MED ORDER — PNEUMOCOCCAL VAC POLYVALENT 25 MCG/0.5ML IJ INJ
0.5000 mL | INJECTION | INTRAMUSCULAR | Status: DC
  Filled 2015-08-02: qty 0.5

## 2015-08-02 MED ORDER — IPRATROPIUM-ALBUTEROL 0.5-2.5 (3) MG/3ML IN SOLN
3.0000 mL | Freq: Four times a day (QID) | RESPIRATORY_TRACT | Status: DC
  Administered 2015-08-02 (×3): 3 mL via RESPIRATORY_TRACT
  Filled 2015-08-02 (×3): qty 3

## 2015-08-02 MED ORDER — HEPARIN BOLUS VIA INFUSION
2000.0000 [IU] | Freq: Once | INTRAVENOUS | Status: AC
  Administered 2015-08-02: 2000 [IU] via INTRAVENOUS
  Filled 2015-08-02: qty 2000

## 2015-08-02 MED ORDER — NITROGLYCERIN 2 % TD OINT
1.0000 [in_us] | TOPICAL_OINTMENT | Freq: Four times a day (QID) | TRANSDERMAL | Status: DC
  Administered 2015-08-02 – 2015-08-03 (×5): 1 [in_us] via TOPICAL
  Filled 2015-08-02 (×4): qty 30
  Filled 2015-08-02: qty 1
  Filled 2015-08-02: qty 30

## 2015-08-02 MED ORDER — ALBUTEROL SULFATE (2.5 MG/3ML) 0.083% IN NEBU: 2.5000 mg | INHALATION_SOLUTION | RESPIRATORY_TRACT | Status: DC | PRN

## 2015-08-02 MED ORDER — FUROSEMIDE 10 MG/ML IJ SOLN
40.0000 mg | Freq: Two times a day (BID) | INTRAMUSCULAR | Status: DC
  Administered 2015-08-02 – 2015-08-03 (×4): 40 mg via INTRAVENOUS
  Filled 2015-08-02 (×4): qty 4

## 2015-08-02 MED ORDER — HEPARIN (PORCINE) IN NACL 100-0.45 UNIT/ML-% IJ SOLN
1250.0000 [IU]/h | INTRAMUSCULAR | Status: DC
  Administered 2015-08-02: 1250 [IU]/h via INTRAVENOUS
  Filled 2015-08-02: qty 250

## 2015-08-02 NOTE — ED Notes (Signed)
Cardiology at bedside.

## 2015-08-02 NOTE — ED Notes (Signed)
Dr. Alcario Drought back at the bedside

## 2015-08-02 NOTE — Progress Notes (Signed)
[facility]                                                                              Patient Demographics  [name], is a 81 y.o. female, DOB - 03-26-1935, [id number]  Admit date - 08/01/2015   Admitting Physician Etta Quill, DO  Outpatient Primary MD for the patient is Lynne Logan, MD  LOS - 1   Chief Complaint  Patient presents with  . Respiratory Distress       Brief HPI   [name] is a 81 y.o. female with h/o CAD, MI, CKD stage 4, CVA (ischemic in 2008, hemorrhagic in 2012), HTN presented to ED with shortness of breath, chest pain and increasing dyspnea, worsening over the last 2 days. No fevers or chills. The patient had been taking a lot of NTG at home for chest pain (5 SL NTG), currently pain free but does get pain with minimal activity (walking to bathroom). Breathing also improved modestly with neb treatments in ED. ED workup showed positive troponin of 0.55, EKG is ischemic, NSTEMI. Cardiology was consulted and patient was placed on heparin drip and admitted to stepdown unit.   Assessment & Plan    Principal Problem:   NSTEMI (non-ST elevated myocardial infarction) [facility]) - Cardiology consulted, no chest pain currently - Patient placed on IV heparin, continue aspirin, Lipitor, Coreg, Imdur, Ranexa - Patient is refusing cardiac cath  Active Problems:   Acute systolic congestive heart failure, NYHA class 4 (HCC) likely exacerbated by hypertensive emergency, noncompliance, pneumonia - BNP> 4000 at the time of admission - Appreciate cardiology recommendations, continue IV Lasix - Follow strict I's and O's and daily weights - Not on ACEI/ARB due to chronic kidney disease     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, NYHA class 4 and CKD stage 4 (HCC) - BP 210/97 at the time of admission - Continue amlodipine, Imdur, Lasix, Coreg    CAP (community acquired pneumonia) -  Continue IV Zithromax, Rocephin, DuoNeb - Chest x-ray showed new focal right upper lobe opacity, follow-up chest x-ray in 3-4 weeks - Blood cultures not ordered in ED, check pro-calcitonin, blood cultures, urine legionella antigen, urine strep antigen, influenza panel  CK D stage IV Baseline creatinine ~3, follow closely  Hyperglycemia with no underlying history of diabetes - Obtain hemoglobin A1c   Code Status: DO NOT RESUSCITATE   Family Communication: Discussed in detail with the patient, all imaging results, lab results explained to the patient    Disposition Plan: monitor in SDU  Time Spent in minutes  63minutes  Procedures  None   Consults   cardiology  DVT Prophylaxis   heparin   Medications  Scheduled Meds: . amLODipine  5 mg Oral Daily  . aspirin  324 mg Oral NOW   Or  . aspirin  300 mg Rectal NOW  . aspirin  81 mg Oral Daily  . aspirin EC  81 mg Oral Daily  . atorvastatin  20 mg Oral q1800  . azithromycin  250 mg Oral Daily  . carvedilol  25 mg Oral BID WC  .  cefTRIAXone (ROCEPHIN)  IV  1 g Intravenous Q24H  . furosemide  40 mg Intravenous BID  . isosorbide mononitrate  60 mg Oral Daily  . nitroGLYCERIN  1 inch Topical 4 times per day  . ranolazine  500 mg Oral BID   Continuous Infusions: . heparin 1,050 Units/hr (08/02/15 0800)   PRN Meds:.acetaminophen, albuterol, nitroGLYCERIN, ondansetron (ZOFRAN) IV   Antibiotics   Anti-infectives    Start     Dose/Rate Route Frequency Ordered Stop   08/02/15 2200  cefTRIAXone (ROCEPHIN) 1 g in dextrose 5 % 50 mL IVPB     1 g 100 mL/hr over 30 Minutes Intravenous Every 24 hours 08/01/15 2313     08/02/15 1000  azithromycin (ZITHROMAX) tablet 250 mg     250 mg Oral Daily 08/01/15 2313     08/01/15 2000  azithromycin (ZITHROMAX) 500 mg in dextrose 5 % 250 mL IVPB     500 mg 250 mL/hr over 60 Minutes Intravenous  Once 08/01/15 1949 08/01/15 2139   08/01/15 2000  cefTRIAXone (ROCEPHIN) 1 g in dextrose 5 % 50 mL  IVPB     1 g 100 mL/hr over 30 Minutes Intravenous  Once 08/01/15 1949 08/01/15 2036        Subjective:   [name] was seen and examined today. Denies any chest pain or shortness of breath at rest. Patient denies dizziness, chest pain, abdominal pain, N/V/D/C, new weakness, numbess, tingling. No acute events overnight.    Objective:   Filed Vitals:   08/02/15 0715 08/02/15 0730 08/02/15 0800 08/02/15 1000  BP: 182/86 186/94 172/109 133/68  Pulse: 77 75 77 58  Temp:   98 F (36.7 C)   TempSrc:      Resp: 33 21 32 20  Height:      Weight:      SpO2: 96% 98% 96% 94%    Intake/Output Summary (Last 24 hours) at 08/02/15 1144 Last data filed at 08/02/15 1100  Gross per 24 hour  Intake 137.78 ml  Output   1475 ml  Net -1337.22 ml     Wt Readings from Last 3 Encounters:  08/02/15 75 kg (165 lb 5.5 oz)  03/17/14 83.915 kg (185 lb)  02/24/14 86.546 kg (190 lb 12.8 oz)     Exam  General: Alert and oriented x 3, NAD, Speaking in full sentences   HEENT:  PERRLA, EOMI, Anicteric Sclera, mucous membranes moist.   Neck: Supple, no JVD, no masses  CVS: S1 S2 auscultated, no rubs, murmurs or gallops. Regular rate and rhythm.  Respiratory: Bilateral rhonchi   Abdomen: Soft, nontender, nondistended, + bowel sounds  Ext: no cyanosis clubbing , trace edema  Neuro: AAOx3, Cr N's II- XII. Strength 5/5 upper and lower extremities bilaterally  Skin: No rashes  Psych: Normal affect and demeanor, alert and oriented x3    Data Review   Micro Results Recent Results (from the past 240 hour(s))  MRSA PCR Screening     Status: None   Collection Time: 08/02/15  6:47 AM  Result Value Ref Range Status   MRSA by PCR NEGATIVE NEGATIVE Final    Comment:        The GeneXpert MRSA Assay (FDA approved for NASAL specimens only), is one component of a comprehensive MRSA colonization surveillance program. It is not intended to diagnose MRSA infection nor to guide  or monitor treatment for MRSA infections.     Radiology Reports Dg Chest Port 1 View  08/01/2015  CLINICAL  DATA:  Short of breath EXAM: PORTABLE CHEST 1 VIEW COMPARISON:  02/20/2014 FINDINGS: The heart is moderately enlarged. Aorta remains tortuous. A focal opacity in the right upper lung zone has developed. Pulmonary vascularity is within normal limits. No pneumothorax or pleural effusion. IMPRESSION: New focal right upper lobe opacity has developed. Followup PA and lateral chest X-ray is recommended in 3-4 weeks following trial of antibiotic therapy to ensure resolution and exclude underlying malignancy. Electronically Signed   By: Marybelle Killings M.D.   On: 08/01/2015 19:33    CBC  Recent Labs Lab 08/01/15 1922 08/02/15 0334  WBC 7.0 6.9  HGB 12.1 11.6*  HCT 40.2 38.6  PLT 189 186  MCV 88.0 88.3  MCH 26.5 26.5  MCHC 30.1 30.1  RDW 16.5* 16.4*  LYMPHSABS 0.9  --   MONOABS 0.6  --   EOSABS 0.0  --   BASOSABS 0.0  --     Chemistries   Recent Labs Lab 08/01/15 1922 08/02/15 0334  NA 139 138  K 3.7 4.0  CL 104 103  CO2 20* 21*  GLUCOSE 128* 103*  BUN 32* 34*  CREATININE 2.84* 3.06*  CALCIUM 8.4* 8.0*   ------------------------------------------------------------------------------------------------------------------ estimated creatinine clearance is 15.3 mL/min (by C-G formula based on Cr of 3.06). ------------------------------------------------------------------------------------------------------------------ No results for input(s): HGBA1C in the last 72 hours. ------------------------------------------------------------------------------------------------------------------ No results for input(s): CHOL, HDL, LDLCALC, TRIG, CHOLHDL, LDLDIRECT in the last 72 hours. ------------------------------------------------------------------------------------------------------------------ No results for input(s): TSH, T4TOTAL, T3FREE, THYROIDAB in the last 72 hours.  Invalid  input(s): FREET3 ------------------------------------------------------------------------------------------------------------------ No results for input(s): VITAMINB12, FOLATE, FERRITIN, TIBC, IRON, RETICCTPCT in the last 72 hours.  Coagulation profile No results for input(s): INR, PROTIME in the last 168 hours.  No results for input(s): DDIMER in the last 72 hours.  Cardiac Enzymes  Recent Labs Lab 08/01/15 2328 08/02/15 0334 08/02/15 0948  TROPONINI 0.73* 0.81* 0.84*   ------------------------------------------------------------------------------------------------------------------ Invalid input(s): POCBNP  No results for input(s): GLUCAP in the last 72 hours.   [name] M.D. [facility] 08/02/2015, 11:44 AM  Pager: [phone] Between 7am to 7pm - call Pager - [phone]  After 7pm go to [email or web address] - password [email or web address]  Call night coverage person covering after 7pm

## 2015-08-02 NOTE — Consult Note (Addendum)
CARDIOLOGY INPATIENT CONSULTATION NOTE  Patient ID: [name] MRN: [id number], DOB/AGE: 08/12/1934   Admit date: 08/01/2015   Primary Physician: Lynne Logan, MD Primary Cardiologist: Darlin Coco MD  Reason for Consult:   Elevated troponin  Requesting Physician: Jennette Kettle DO  HPI: This is a 81 y.o. female with CAD, MI first in 2001, CKD stage 4, CVA (ischemic in 2008, hemorrhagic in 2012), HTN and ischemic cardiomyopathy (LVEF 25-30%), DNR and poor compliance with the medications who presented with chest pain. Patient is admitted under triad hospitalist service and is being managed for HTN urgency, community acquired pneumonia and NSTEMI.   Patient is complaining of chest pain which started at 11 pm when she woke up and was going to the bathroom. She started having sudden onset chest pain. She took a NTG but it did not help the pain much. These pains have been going on 3 days. His son called the EMS and patient was sent to the hospital.  The chest pain is described present in the left side with radiation across the right chest. She rates the pain as 10/10 in intensity. She had associated SOB, elevated BP, nausea. She denied any vomiting, diarrhea, constipation, dysphagia. She also has a cough for the last one week which is mostly dry. She lives with her son. She is not very mobile and does not exercise.  Regarding her CAD, patient had first MI in 2001 probably in [place], [place]. She had another cardiac cath performed in 2008 for scalp cancer operation. Patient presented with NSTEMI 2 years ago and was treated medically due to her refusal to go for cardiac cath due to risk for dialysis.   Problem List: Past Medical History  Diagnosis Date  . Myocardial infarction ([place])   . Hypertension   . Chronic combined systolic and diastolic CHF (congestive heart failure) ([place])     a. EF 45-50% in 0000000. b. Mixed systolic/diastolic - EF [id number] by echo 2014, 35% by nuc.  Marland Kitchen Anemia     . Stroke [facility])     a. 2008: acute stroke in left centrum ovale. 06/2010: hemorrhagic stroke.  Marland Kitchen GERD (gastroesophageal reflux disease)   . Headache(784.0)   . Arthritis   . Depression   . CKD (chronic kidney disease), stage IV ([place]) 08/29/2012  . Cancer (HCC)     scalp  . Nephrolithiasis   . Hx of peptic ulcer   . Osteoarthritis, generalized   . Gastrointestinal hemorrhage     a. Pt reports she has h/o this many years ago, requiring 2 units of blood. Thinks it was in [place] but records not available. She does not know why this happened.  Marland Kitchen CAD (coronary artery disease)   . H/O medication noncompliance     a. Per notes, due to cost.   . CAD (coronary artery disease)     a. Stents in [facility] 2001 and cath 2008 with possible balloon or stent at [facility] (not available in [place]).   . Hemorrhoid     a. Mild hemorrhoidal bleeding per previous DC note.  Marland Kitchen COPD (chronic obstructive pulmonary disease) [facility])     Past Surgical History  Procedure Laterality Date  . Appendectomy    . Tonsillectomy    . Abdominal hysterectomy    . Cholecystectomy    . Eye surgery    . Colon surgery    . Colonoscopy  07/18/10  . Melanoma excision  1989    distal right LE  Allergies: No Known Allergies   Home Medications Current Facility-Administered Medications  Medication Dose Route Frequency Provider Last Rate Last Dose  . acetaminophen (TYLENOL) tablet 650 mg  650 mg Oral Q4H PRN Etta Quill, DO      . albuterol (PROVENTIL) (2.5 MG/3ML) 0.083% nebulizer solution 2.5 mg  2.5 mg Nebulization Q2H PRN Etta Quill, DO      . amLODipine (NORVASC) tablet 5 mg  5 mg Oral Daily Etta Quill, DO      . aspirin chewable tablet 324 mg  324 mg Oral NOW Etta Quill, DO   324 mg at 08/01/15 2322   Or  . aspirin suppository 300 mg  300 mg Rectal NOW Etta Quill, DO      . aspirin chewable tablet 81 mg  81 mg Oral Daily Jared M Gardner, DO      . aspirin EC tablet 81 mg  81 mg Oral  Daily Etta Quill, DO      . atorvastatin (LIPITOR) tablet 20 mg  20 mg Oral q1800 Etta Quill, DO      . azithromycin [facility]) tablet 250 mg  250 mg Oral Daily Etta Quill, DO      . carvedilol (COREG) tablet 25 mg  25 mg Oral BID WC Etta Quill, DO      . cefTRIAXone (ROCEPHIN) 1 g in dextrose 5 % 50 mL IVPB  1 g Intravenous Q24H Etta Quill, DO      . furosemide (LASIX) tablet 40 mg  40 mg Oral Daily Etta Quill, DO      . heparin ADULT infusion 100 units/mL (25000 units/250 mL)  900 Units/hr Intravenous Continuous Roma Schanz, RPH 9 mL/hr at 08/01/15 2036 900 Units/hr at 08/01/15 2036  . isosorbide mononitrate (IMDUR) 24 hr tablet 60 mg  60 mg Oral Daily Etta Quill, DO      . nitroGLYCERIN (NITROGLYN) 2 % ointment 1 inch  1 inch Topical 4 times per day Etta Quill, DO   1 inch at 08/02/15 0149  . nitroGLYCERIN (NITROSTAT) SL tablet 0.4 mg  0.4 mg Sublingual Q5 Min x 3 PRN Etta Quill, DO      . ondansetron [facility]) injection 4 mg  4 mg Intravenous Q6H PRN Etta Quill, DO      . ranolazine (RANEXA) 12 hr tablet 500 mg  500 mg Oral BID Etta Quill, DO       Current Outpatient Prescriptions  Medication Sig Dispense Refill  . amLODipine (NORVASC) 5 MG tablet Take 1 tablet (5 mg total) by mouth daily. 30 tablet 6  . aspirin 81 MG chewable tablet Chew 1 tablet (81 mg total) by mouth daily.    Marland Kitchen atorvastatin (LIPITOR) 20 MG tablet Take 1 tablet (20 mg total) by mouth daily at 6 PM. 30 tablet 11  . carvedilol (COREG) 25 MG tablet Take 1 tablet (25 mg total) by mouth 2 (two) times daily with a meal. 60 tablet 11  . furosemide (LASIX) 40 MG tablet Take 40 mg by mouth daily.    . isosorbide mononitrate (IMDUR) 60 MG 24 hr tablet Take 1 tablet (60 mg total) by mouth daily. 30 tablet 11  . nitroGLYCERIN (NITROSTAT) 0.4 MG SL tablet Place 1 tablet (0.4 mg total) under the tongue every 5 (five) minutes as needed for chest pain. 25 tablet prn  .  ranolazine (RANEXA) 500 MG 12 hr tablet Take 1  tablet (500 mg total) by mouth 2 (two) times daily. 60 tablet 11     Family History  Problem Relation Age of Onset  . Diabetes Mother   . Hypertension Mother   . Diabetes Sister   . Cancer Son     breast  . Diabetes Sister      Social History   Social History  . Marital Status: Divorced    Spouse Name: N/A  . Number of Children: N/A  . Years of Education: N/A   Occupational History  . Not on file.   Social History Main Topics  . Smoking status: Current Every Day Smoker    Types: Cigarettes  . Smokeless tobacco: Never Used  . Alcohol Use: No  . Drug Use: No  . Sexual Activity: Not on file   Other Topics Concern  . Not on file   Social History Narrative   Graduate [employer]   Married '59-30 yrs/divorced   2 sons, '61 '62; 8 grandchildren   Works as Optometrist, Field seismologist   Retired-lives in her own home and her son lives with her.      Review of Systems: General: fatigue increase weight negative for chills, fever, night sweats  Cardiovascular: leg edema, chest pain  Dermatological: positive for bruises Respiratory: productive cough negative for wheezing  Urologic: negative for hematuria Abdominal: nausea negative for vomiting, diarrhea, bright red blood per rectum, melena, or hematemesis Neurologic: negative for visual changes, syncope, or dizziness Hematology: mild anemia Psychiatry: non suicidal/homicidal  Musculoskeletal: back pain and knee pains Skin: bruises on the arms  Physical Exam: Vitals: BP 158/68 mmHg  Pulse 77  Temp(Src) 97.4 F (36.3 C) (Oral)  Resp 31  Ht 5\' 5"  (1.651 m)  Wt 77.111 kg (170 lb)  BMI 28.29 kg/m2  SpO2 98% General: not in acute distress, coughing, old looking frail lady  Neck: JVP elevated, no thyromegaly  Heart: regular rate and rhythm, S1, S2, no murmurs  Lungs: coarse crackles all over the lungs  GI: non tender, non distended, bowel sounds  present Extremities: trace ankle edema, right leg has a large bruise, tenderness in knee joints bilaterally Neuro: AAO x 3, bilateral hearing loss Psych: normal affect, no anxiety   Labs:   Results for orders placed or performed during the hospital encounter of 08/01/15 (from the past 24 hour(s))  CBC     Status: Abnormal   Collection Time: 08/01/15  7:22 PM  Result Value Ref Range   WBC 7.0 4.0 - 10.5 K/uL   RBC 4.57 3.87 - 5.11 MIL/uL   Hemoglobin 12.1 12.0 - 15.0 g/dL   HCT 40.2 36.0 - 46.0 %   MCV 88.0 78.0 - 100.0 fL   MCH 26.5 26.0 - 34.0 pg   MCHC 30.1 30.0 - 36.0 g/dL   RDW 16.5 (H) 11.5 - 15.5 %   Platelets 189 150 - 400 K/uL  Basic metabolic panel     Status: Abnormal   Collection Time: 08/01/15  7:22 PM  Result Value Ref Range   Sodium 139 135 - 145 mmol/L   Potassium 3.7 3.5 - 5.1 mmol/L   Chloride 104 101 - 111 mmol/L   CO2 20 (L) 22 - 32 mmol/L   Glucose, Bld 128 (H) 65 - 99 mg/dL   BUN 32 (H) 6 - 20 mg/dL   Creatinine, Ser 2.84 (H) 0.44 - 1.00 mg/dL   Calcium 8.4 (L) 8.9 - 10.3 mg/dL   GFR calc non Af Amer 15 (L) >  60 mL/min   GFR calc Af Amer 17 (L) >60 mL/min   Anion gap 15 5 - 15  Brain natriuretic peptide (only with dyspnea)     Status: Abnormal   Collection Time: 08/01/15  7:22 PM  Result Value Ref Range   B Natriuretic Peptide 4106.8 (H) 0.0 - 100.0 pg/mL  Differential     Status: None   Collection Time: 08/01/15  7:22 PM  Result Value Ref Range   Neutrophils Relative % 78 %   Neutro Abs 5.3 1.7 - 7.7 K/uL   Lymphocytes Relative 13 %   Lymphs Abs 0.9 0.7 - 4.0 K/uL   Monocytes Relative 9 %   Monocytes Absolute 0.6 0.1 - 1.0 K/uL   Eosinophils Relative 0 %   Eosinophils Absolute 0.0 0.0 - 0.7 K/uL   Basophils Relative 0 %   Basophils Absolute 0.0 0.0 - 0.1 K/uL  I-stat troponin, ED (not at [facility])     Status: Abnormal   Collection Time: 08/01/15  7:27 PM  Result Value Ref Range   Troponin i, poc 0.70 (HH) 0.00 - 0.08 ng/mL   Comment  NOTIFIED PHYSICIAN    Comment 3          Troponin I     Status: Abnormal   Collection Time: 08/01/15  8:08 PM  Result Value Ref Range   Troponin I 0.55 (HH) <0.031 ng/mL  Troponin I     Status: Abnormal   Collection Time: 08/01/15 11:28 PM  Result Value Ref Range   Troponin I 0.73 (HH) <0.031 ng/mL     Radiology/Studies: Dg Chest Port 1 View  08/01/2015  CLINICAL DATA:  Short of breath EXAM: PORTABLE CHEST 1 VIEW COMPARISON:  02/20/2014 FINDINGS: The heart is moderately enlarged. Aorta remains tortuous. A focal opacity in the right upper lung zone has developed. Pulmonary vascularity is within normal limits. No pneumothorax or pleural effusion. IMPRESSION: New focal right upper lobe opacity has developed. Followup PA and lateral chest X-ray is recommended in 3-4 weeks following trial of antibiotic therapy to ensure resolution and exclude underlying malignancy. Electronically Signed   By: Marybelle Killings M.D.   On: 08/01/2015 19:33    EKG: today normal sinus rhythm, LVH with T wave inversion in the inferolateral leads   Echo: 02/2014 Moderately dilated LV, moderate LVH, global diffuse HK with LVEF of 25-30%, severely LA dilatation, RA mildly dilated  Cardiac cath: results not available  Medical decision making:  Discussed care with the patient Discussed care with the hospitalist on the phone Reviewed labs and imaging personally Reviewed prior records  ASSESSMENT AND PLAN:  This is a 81 y.o. female with CAD, MI first in 2001, CKD stage 4, CVA (ischemic in 2008, hemorrhagic in 2012), HTN and ischemic cardiomyopathy (LVEF 25-30%) who presented with chest pain. Patient did not have medications refilled since she quit the doctor as she did not have money to go to her PCP.    Principal Problem:   NSTEMI (non-ST elevated myocardial infarction) ([place]) Active Problems:   Acute systolic congestive heart failure, NYHA class 4 (HCC)   Malignant hypertensive heart and kidney disease with combined  systolic and diastolic CHF, NYHA class 4 and CKD stage 4 ([place])   CAP (community acquired pneumonia)   1. Elevated troponin - mild elevation with symptoms  Treat as NSTEMI but could be a secondary to HTN urgency and CAP Aspirin, plavix, high dose statin  IV heparin for 48 hours, echocardiogram  Cycle troponin, repeat  EKG daily  Patient has refused cardiac cath in the past and continues to refuse it. Consider discussing it with the son if troponin continues to go up or any complication of MI develops Consider involving social worker for possible help with the medication compliance   2. Acute systolic congestive heart failure, NYHA class 4 exacerbated by HTN emergency, medication non compliance and CAP - IV diuresis with at least >2 L/day, control bp, consider NTG - repeat echocardiogram to evaluate improvement in LVEF - discussed about ICD with the patient but she wants to think about it  - counseled about compliance with medications   3. Hypertensive emergency  - NTG, restart home medications as patient wasn't taking them.   4. CAP - treat per primary team  5. CKD stage IV - avoid MAP <80, avoid contrast, avoid ACEi/ARB for now and reassess as outpatient Signed, Flossie Dibble, MD MS 08/02/2015, 1:50 AM

## 2015-08-02 NOTE — Care Management Note (Signed)
Case Management Note  Patient Details  Name: [name] MRN: [id number] Date of Birth: 04-09-1935  Subjective/Objective:        Adm w mi            Action/Plan: lives at home, pcp dr sun  Expected Discharge Date:                  Expected Discharge Plan:     In-House Referral:     Discharge planning Services     Post Acute Care Choice:    Choice offered to:     DME Arranged:    DME Agency:     HH Arranged:    [place] Agency:     Status of Service:     Medicare Important Message Given:    Date Medicare IM Given:    Medicare IM give by:    Date Additional Medicare IM Given:    Additional Medicare Important Message give by:     If discussed at [place] of Stay Meetings, dates discussed:    Additional Comments: ur review done  Lacretia Leigh, RN 08/02/2015, 8:05 AM

## 2015-08-02 NOTE — Progress Notes (Signed)
ANTICOAGULATION CONSULT NOTE - Follow Up Consult  Pharmacy Consult for Heparin  Indication: chest pain/ACS  No Known Allergies  Patient Measurements: Height: 5' 4.96" (165 cm) Weight: 165 lb 5.5 oz (75 kg) IBW/kg (Calculated) : 56.91  Vital Signs: Temp: 98 F (36.7 C) (02/15 2000) Temp Source: Oral (02/15 2000) BP: 111/56 mmHg (02/15 2200) Pulse Rate: 56 (02/15 2200)  Labs:  Recent Labs  08/01/15 1922  08/01/15 2328 08/02/15 0334 08/02/15 0948 08/02/15 1309 08/02/15 2247  HGB 12.1  --   --  11.6*  --   --   --   HCT 40.2  --   --  38.6  --   --   --   PLT 189  --   --  186  --   --   --   HEPARINUNFRC  --   --   --  0.23*  --  0.16* 0.59  CREATININE 2.84*  --   --  3.06*  --   --   --   TROPONINI  --   < > 0.73* 0.81* 0.84*  --   --   < > = values in this interval not displayed.  Estimated Creatinine Clearance: 15.3 mL/min (by C-G formula based on Cr of 3.06).   Assessment: Heparin for elevated troponin, pt refusing cath, planning for heparin x 48 hrs, HL is therapeutic x 1 after rate increase  Goal of Therapy:  Heparin level 0.3-0.7 units/ml Monitor platelets by anticoagulation protocol: Yes   Plan:  -Cont heparin at 1250 units/hr -Confirmatory HL with AM labs  Narda Bonds 08/02/2015,11:32 PM

## 2015-08-02 NOTE — Progress Notes (Addendum)
ANTICOAGULATION CONSULT NOTE - Follow Up Consult  Pharmacy Consult for Heparin  Indication: chest pain/ACS  No Known Allergies  Patient Measurements: Height: 5\' 5"  (165.1 cm) Weight: 170 lb (77.111 kg) IBW/kg (Calculated) : 57  Vital Signs: Temp: 97.4 F (36.3 C) (02/14 1910) Temp Source: Oral (02/14 1910) BP: 155/68 mmHg (02/15 0445) Pulse Rate: 72 (02/15 0445)  Labs:  Recent Labs  08/01/15 1922 08/01/15 2008 08/01/15 2328 08/02/15 0334  HGB 12.1  --   --  11.6*  HCT 40.2  --   --  38.6  PLT 189  --   --  186  HEPARINUNFRC  --   --   --  0.23*  CREATININE 2.84*  --   --  3.06*  TROPONINI  --  0.55* 0.73* 0.81*    Estimated Creatinine Clearance: 15.5 mL/min (by C-G formula based on Cr of 3.06).   Assessment: Heparin for elevated troponin, pt refusing cath, planning for heparin x 48 hrs  Goal of Therapy:  Heparin level 0.3-0.7 units/ml Monitor platelets by anticoagulation protocol: Yes   Plan:  -Increase heparin to 1050 units/hr -1300 HL  Narda Bonds 08/02/2015,5:02 AM   Addendum: Heparin level is decreased and still below goal at 0.16 despite rate increase earlier today. No issues with infusion per RN.  Plan: 1) Re-bolus heparin 2000 units x 1 2) Increase heparin drip to 1250 units/hr 3) Check 8 hour heparin level  Nena Jordan, PharmD, BCPS 08/02/2015, 2:52 PM

## 2015-08-02 NOTE — Progress Notes (Signed)
Patient Name: [name] Date of Encounter: 08/02/2015   SUBJECTIVE  Denies chest pain and sob. She is thinking about cath and needs more time. Son is coming later today, unknown time.   CURRENT MEDS . amLODipine  5 mg Oral Daily  . aspirin  324 mg Oral NOW   Or  . aspirin  300 mg Rectal NOW  . aspirin  81 mg Oral Daily  . aspirin EC  81 mg Oral Daily  . atorvastatin  20 mg Oral q1800  . azithromycin  250 mg Oral Daily  . carvedilol  25 mg Oral BID WC  . cefTRIAXone (ROCEPHIN)  IV  1 g Intravenous Q24H  . furosemide  40 mg Intravenous BID  . isosorbide mononitrate  60 mg Oral Daily  . nitroGLYCERIN  1 inch Topical 4 times per day  . ranolazine  500 mg Oral BID    OBJECTIVE  Filed Vitals:   08/02/15 0615 08/02/15 0650 08/02/15 0700 08/02/15 0715  BP: 158/78 178/71 178/84 182/86  Pulse:  82 80 77  Temp:  97.8 F (36.6 C)    TempSrc:  Oral    Resp: 33 20 25 33  Height:  5' 4.96" (1.65 m)    Weight:  165 lb 5.5 oz (75 kg)    SpO2:  96% 96% 96%    Intake/Output Summary (Last 24 hours) at 08/02/15 0740 Last data filed at 08/02/15 0426  Gross per 24 hour  Intake      0 ml  Output    800 ml  Net   -800 ml   Filed Weights   08/01/15 2000 08/02/15 0650  Weight: 170 lb (77.111 kg) 165 lb 5.5 oz (75 kg)    PHYSICAL EXAM  General: Pleasant frail appearing woman in NAD. Neuro: Alert and oriented X 3. Moves all extremities spontaneously. Psych: Normal affect. HEENT:  Normal  Neck: Supple without bruits. +  JVD. Lungs:  Resp regular and unlabored. Course breath sound throughout.  Heart: RRR no s3, s4, or murmurs. Abdomen: Soft, non-tender, non-distended, BS + x 4.  Extremities: No clubbing, cyanosis. BL trace ankle edema. DP/PT/Radials 2+ and equal bilaterally.  Accessory Clinical Findings  CBC  Recent Labs  08/01/15 1922 08/02/15 0334  WBC 7.0 6.9  NEUTROABS 5.3  --   HGB 12.1 11.6*  HCT 40.2 38.6  MCV 88.0 88.3  PLT 189 [id number]   Basic Metabolic  Panel  Recent Labs  08/01/15 1922 08/02/15 0334  NA 139 138  K 3.7 4.0  CL 104 103  CO2 20* 21*  GLUCOSE 128* 103*  BUN 32* 34*  CREATININE 2.84* 3.06*  CALCIUM 8.4* 8.0*   Cardiac Enzymes  Recent Labs  08/01/15 2008 08/01/15 2328 08/02/15 0334  TROPONINI 0.55* 0.73* 0.81*    TELE  Sinus rhythm  Radiology/Studies  Dg Chest Port 1 View  08/01/2015  CLINICAL DATA:  Short of breath EXAM: PORTABLE CHEST 1 VIEW COMPARISON:  02/20/2014 FINDINGS: The heart is moderately enlarged. Aorta remains tortuous. A focal opacity in the right upper lung zone has developed. Pulmonary vascularity is within normal limits. No pneumothorax or pleural effusion. IMPRESSION: New focal right upper lobe opacity has developed. Followup PA and lateral chest X-ray is recommended in 3-4 weeks following trial of antibiotic therapy to ensure resolution and exclude underlying malignancy. Electronically Signed   By: Marybelle Killings M.D.   On: 08/01/2015 19:33    ASSESSMENT AND PLAN  This is a 81 y.o. female  with CAD, MI first in 2001, CKD stage 4, CVA (ischemic in 2008, hemorrhagic in 2012), HTN and ischemic cardiomyopathy (LVEF 25-30%), DNR and poor compliance with the medications who presented with chest pain. Patient is admitted under triad hospitalist service and is being managed for HTN urgency, community acquired pneumonia and NSTEMI.   1. NSTEMI (non-ST elevated myocardial infarction) ([place]) - Troponin trending up. 0.55-->0.73-->0.81. Continue cycle troponin. Could be due to HTN urgency and CAP. EKG has new TWI in inferior leads when compare to prior EKG.  - Has refused cath in past, refusing now. Need time to think about. She wants to discuss with her son who is coming later today to visit her.  - Continue IV heparin, ASA, Lipitor, coreg, imdur and renexa.   2. Acute systolic congestive heart failure, NYHA class 4 exacerbated by HTN emergency, medication non compliance and CAP - BNP > 4000. Continue  IV lasix. Diuresed 824ml so far. Daily weight and strict I&O. - Last echo 9/15 showed ef of 25-30%, pending repeat echo. - Address medication compliance and importance, seems not interested.  - Not on ACE/ARB due to CKD. Consider adding hydralazine. She is on imdur  3. Hypertensive urgency - restarted home regimen   4. CKD, stage 4 - baseline Scr of ~3. Follow closely   5. CAP (community acquired pneumonia) - per primary  6. Hyperglycemia - Consider A1c check  Signed, Bhagat,Bhavinkumar PA-C Pager [phone]  Patient examined chart reviewed.  SEM basilar rales  Spoke with her son [name]  [id number] Patient clearly indicates that she does not want heart cath as their is a chance it will shut her kidneys down And she refuses to have dialysis Currently pain free  Continue current medical rx including beta blocker nitrates And ranexa.  No real evolution in troponin  ECG;s likely equally reflect LVH with strain.  Consider palliative care Consult. Suspect d/c next 48 hrs with medical rx similar to previous hospital admission  [name]

## 2015-08-02 NOTE — Progress Notes (Signed)
Only modest output after 40mg  IV lasix, giving another 40mg  IV now and putting patient on 40mg  IV BID

## 2015-08-02 NOTE — Progress Notes (Signed)
  Echocardiogram 2D Echocardiogram has been performed.  Jennette Dubin 08/02/2015, 1:38 PM

## 2015-08-03 DIAGNOSIS — Z515 Encounter for palliative care: Secondary | ICD-10-CM | POA: Insufficient documentation

## 2015-08-03 DIAGNOSIS — J189 Pneumonia, unspecified organism: Secondary | ICD-10-CM | POA: Insufficient documentation

## 2015-08-03 DIAGNOSIS — Z7189 Other specified counseling: Secondary | ICD-10-CM | POA: Insufficient documentation

## 2015-08-03 DIAGNOSIS — I5043 Acute on chronic combined systolic (congestive) and diastolic (congestive) heart failure: Secondary | ICD-10-CM | POA: Insufficient documentation

## 2015-08-03 LAB — CBC
HCT: 33.8 % — ABNORMAL LOW (ref 36.0–46.0)
HEMOGLOBIN: 10.2 g/dL — AB (ref 12.0–15.0)
MCH: 26.4 pg (ref 26.0–34.0)
MCHC: 30.2 g/dL (ref 30.0–36.0)
MCV: 87.3 fL (ref 78.0–100.0)
Platelets: 181 10*3/uL (ref 150–400)
RBC: 3.87 MIL/uL (ref 3.87–5.11)
RDW: 16.7 % — ABNORMAL HIGH (ref 11.5–15.5)
WBC: 5.4 10*3/uL (ref 4.0–10.5)

## 2015-08-03 LAB — BASIC METABOLIC PANEL
Anion gap: 12 (ref 5–15)
BUN: 58 mg/dL — AB (ref 6–20)
CHLORIDE: 103 mmol/L (ref 101–111)
CO2: 23 mmol/L (ref 22–32)
CREATININE: 4.31 mg/dL — AB (ref 0.44–1.00)
Calcium: 7.4 mg/dL — ABNORMAL LOW (ref 8.9–10.3)
GFR calc Af Amer: 10 mL/min — ABNORMAL LOW (ref >60)
GFR calc non Af Amer: 9 mL/min — ABNORMAL LOW (ref >60)
GLUCOSE: 115 mg/dL — AB (ref 65–99)
Potassium: 3.9 mmol/L (ref 3.5–5.1)
SODIUM: 138 mmol/L (ref 135–145)

## 2015-08-03 LAB — HEMOGLOBIN A1C
Hgb A1c MFr Bld: 5.5 % (ref 4.8–5.6)
Mean Plasma Glucose: 111 mg/dL

## 2015-08-03 LAB — HEPARIN LEVEL (UNFRACTIONATED): Heparin Unfractionated: 0.57 IU/mL (ref 0.30–0.70)

## 2015-08-03 LAB — INFLUENZA PANEL BY PCR (TYPE A & B)
H1N1FLUPCR: NOT DETECTED
Influenza A By PCR: POSITIVE — AB
Influenza B By PCR: NEGATIVE

## 2015-08-03 MED ORDER — IPRATROPIUM-ALBUTEROL 0.5-2.5 (3) MG/3ML IN SOLN
3.0000 mL | Freq: Two times a day (BID) | RESPIRATORY_TRACT | Status: DC
  Administered 2015-08-03 – 2015-08-06 (×2): 3 mL via RESPIRATORY_TRACT
  Filled 2015-08-03 (×8): qty 3

## 2015-08-03 MED ORDER — MORPHINE SULFATE (PF) 2 MG/ML IV SOLN: 2.0000 mg | INTRAVENOUS | Status: DC | PRN

## 2015-08-03 MED ORDER — OSELTAMIVIR PHOSPHATE 30 MG PO CAPS
30.0000 mg | ORAL_CAPSULE | Freq: Once | ORAL | Status: AC
  Administered 2015-08-03: 30 mg via ORAL
  Filled 2015-08-03: qty 1

## 2015-08-03 MED ORDER — OSELTAMIVIR PHOSPHATE 30 MG PO CAPS
30.0000 mg | ORAL_CAPSULE | Freq: Every day | ORAL | Status: AC
  Administered 2015-08-03 – 2015-08-07 (×5): 30 mg via ORAL
  Filled 2015-08-03 (×5): qty 1

## 2015-08-03 MED ORDER — FUROSEMIDE 10 MG/ML IJ SOLN
40.0000 mg | Freq: Every day | INTRAMUSCULAR | Status: DC
  Administered 2015-08-04: 40 mg via INTRAVENOUS
  Filled 2015-08-03: qty 4

## 2015-08-03 NOTE — Progress Notes (Signed)
MEDICATION RELATED CONSULT NOTE - INITIAL   Pharmacy Consult for Tamifu in setting of worsening renal function   No Known Allergies  Patient Measurements: Height: 5' 4.96" (165 cm) Weight: 165 lb 5.5 oz (75 kg) IBW/kg (Calculated) : 56.91  Vital Signs: Temp: 98 F (36.7 C) (02/16 0400) Temp Source: Oral (02/16 0400) BP: 129/49 mmHg (02/16 0600) Pulse Rate: 55 (02/16 0600) Intake/Output from previous day: 02/15 0701 - 02/16 0700 In: 416.6 [I.V.:366.6; IV Piggyback:50] Out: 2125 [Urine:2125] Intake/Output from this shift:    Labs:  Recent Labs  08/01/15 1922 08/02/15 0334 08/03/15 0300  WBC 7.0 6.9 5.4  HGB 12.1 11.6* 10.2*  HCT 40.2 38.6 33.8*  PLT 189 186 181  CREATININE 2.84* 3.06* 4.31*   Estimated Creatinine Clearance: 10.9 mL/min (by C-G formula based on Cr of 4.31).   Microbiology: Recent Results (from the past 720 hour(s))  MRSA PCR Screening     Status: None   Collection Time: 08/02/15  6:47 AM  Result Value Ref Range Status   MRSA by PCR NEGATIVE NEGATIVE Final    Comment:        The GeneXpert MRSA Assay (FDA approved for NASAL specimens only), is one component of a comprehensive MRSA colonization surveillance program. It is not intended to diagnose MRSA infection nor to guide or monitor treatment for MRSA infections.     Medical History: Past Medical History  Diagnosis Date  . Myocardial infarction ([place])   . Hypertension   . Chronic combined systolic and diastolic CHF (congestive heart failure) ([place])     a. EF 45-50% in 0000000. b. Mixed systolic/diastolic - EF [id number] by echo 2014, 35% by nuc.  Marland Kitchen Anemia   . Stroke [facility])     a. 2008: acute stroke in left centrum ovale. 06/2010: hemorrhagic stroke.  Marland Kitchen GERD (gastroesophageal reflux disease)   . Headache(784.0)   . Arthritis   . Depression   . CKD (chronic kidney disease), stage IV ([place]) 08/29/2012  . Cancer (HCC)     scalp  . Nephrolithiasis   . Hx of peptic ulcer   . Osteoarthritis,  generalized   . Gastrointestinal hemorrhage     a. Pt reports she has h/o this many years ago, requiring 2 units of blood. Thinks it was in [place] but records not available. She does not know why this happened.  Marland Kitchen CAD (coronary artery disease)   . H/O medication noncompliance     a. Per notes, due to cost.   . CAD (coronary artery disease)     a. Stents in [facility] 2001 and cath 2008 with possible balloon or stent at [facility] (not available in [place]).   . Hemorrhoid     a. Mild hemorrhoidal bleeding per previous DC note.  Marland Kitchen COPD (chronic obstructive pulmonary disease) ([place])      Assessment: 73 yof admitted 08/01/2015 with SOB and CP. Also with productive cough with yellow sputum. Tamiflu started for treatment. Pharmacy consulted to adjust tamiflu dose with worsening renal function.  SCr 3.06>4.31, CrCl ~49mL/min. Tamiflu currently dosed at 30mg  daily.  Plan:  Continue current dosing of tamiflu 30mg  daily  F/U renal fxn, LOT    Harley Mccartney C. Lennox Grumbles, PharmD Pharmacy Resident  Pager: [phone] 08/03/2015 7:46 AM

## 2015-08-03 NOTE — Progress Notes (Addendum)
Patient Name: [name] Date of Encounter: 08/03/2015   SUBJECTIVE  Denies any SOB or chest pain currently. Had several episodes of loose stool (nonbloody) yesterday after eating, had 1x this morning. Influenza +. Cdiff not collected.   CURRENT MEDS . amLODipine  5 mg Oral Daily  . aspirin EC  81 mg Oral Daily  . atorvastatin  20 mg Oral q1800  . azithromycin  250 mg Oral Daily  . carvedilol  25 mg Oral BID WC  . cefTRIAXone (ROCEPHIN)  IV  1 g Intravenous Q24H  . furosemide  40 mg Intravenous BID  . ipratropium-albuterol  3 mL Nebulization BID  . isosorbide mononitrate  60 mg Oral Daily  . nitroGLYCERIN  1 inch Topical 4 times per day  . oseltamivir  30 mg Oral Daily  . pneumococcal 23 valent vaccine  0.5 mL Intramuscular Tomorrow-1000  . ranolazine  500 mg Oral BID    OBJECTIVE  Filed Vitals:   08/03/15 0400 08/03/15 0500 08/03/15 0600 08/03/15 0800  BP: 112/55  129/49 128/63  Pulse: 52 52 55 64  Temp: 98 F (36.7 C)   98.3 F (36.8 C)  TempSrc: Oral   Oral  Resp: 24 22 28 19   Height:      Weight:      SpO2: 97% 97% 95% 94%    Intake/Output Summary (Last 24 hours) at 08/03/15 0838 Last data filed at 08/03/15 0800  Gross per 24 hour  Intake 335.31 ml  Output   2000 ml  Net -1664.69 ml   Filed Weights   08/01/15 2000 08/02/15 0650  Weight: 77.111 kg (170 lb) 75 kg (165 lb 5.5 oz)    PHYSICAL EXAM  General: Pleasant frail appearing woman in NAD. Some difficulty with hearing.  Neuro: Alert and oriented X 3. Moves all extremities spontaneously. Psych: Normal affect. HEENT:  Normal  Neck: Supple without bruits. +  JVD. Lungs:  Resp regular and unlabored. Course breath sound throughout.  Heart: RRR no s3, s4, or murmurs. Abdomen: Soft, non-tender, non-distended, BS + x 4.  Extremities: No clubbing, cyanosis. BL trace ankle edema. DP/PT/Radials 2+ and equal bilaterally.  Accessory Clinical Findings  CBC  Recent Labs  08/01/15 1922  08/02/15 0334 08/03/15 0300  WBC 7.0 6.9 5.4  NEUTROABS 5.3  --   --   HGB 12.1 11.6* 10.2*  HCT 40.2 38.6 33.8*  MCV 88.0 88.3 87.3  PLT 189 186 0000000   Basic Metabolic Panel  Recent Labs  08/02/15 0334 08/03/15 0300  NA 138 138  K 4.0 3.9  CL 103 103  CO2 21* 23  GLUCOSE 103* 115*  BUN 34* 58*  CREATININE 3.06* 4.31*  CALCIUM 8.0* 7.4*   Cardiac Enzymes  Recent Labs  08/01/15 2328 08/02/15 0334 08/02/15 0948  TROPONINI 0.73* 0.81* 0.84*    TELE  Sinus rhythm  Radiology/Studies  Dg Chest Port 1 View  08/01/2015  CLINICAL DATA:  Short of breath EXAM: PORTABLE CHEST 1 VIEW COMPARISON:  02/20/2014 FINDINGS: The heart is moderately enlarged. Aorta remains tortuous. A focal opacity in the right upper lung zone has developed. Pulmonary vascularity is within normal limits. No pneumothorax or pleural effusion. IMPRESSION: New focal right upper lobe opacity has developed. Followup PA and lateral chest X-ray is recommended in 3-4 weeks following trial of antibiotic therapy to ensure resolution and exclude underlying malignancy. Electronically Signed   By: Marybelle Killings M.D.   On: 08/01/2015 19:33    ASSESSMENT AND PLAN  This is a 81 y.o. female with CAD, MI first in 2001, CKD stage 4, CVA (ischemic in 2008, hemorrhagic in 2012), HTN and ischemic cardiomyopathy (LVEF 25-30%), DNR and poor compliance with the medications who presented with chest pain. Patient is admitted under [facility] service and is being managed for HTN urgency, community acquired pneumonia and NSTEMI.   1. NSTEMI vs trop elevation 2/2 to hypertensive urgency - trop peaked at 0.84. Remains on heparin gtt. Has refused cath in the past during NSTEMI and also refuses now as she does not want to be on dialysis.  - can d/c heparin gtt today, as has been 48 hours. Continue ASA, lipitor, coreg, imdur, and renexa. As patient does not want cath, can likely discharge with medical management. - consider  palliative consult.  2. Acute systolic congestive heart failure, NYHA class 4 exacerbated by HTN emergency, medication non compliance - BNP > 4000. Diuresing well - Last echo 9/15 showed ef of 25-30%,  Repeat ECHO showed EF 30-35%.  - Address medication compliance and importance, seems not interested. Without medication compliance hard to tell if EF would have improved. Now dosse meet criteria for ICD placement. Would discuss further with attending.  - Not on ACE/ARB due to CKD. Could consider adding hydralazine to her imdur if BP goes up, now BP may not tolerate this.   3. Hypertensive urgency - now normotensive after home meds.  - compliance with meds would be important.   4. CKD, stage 4 - baseline Scr of ~3. Now up to 4.31, likely 2/2 to overdiuresis. Consider reducing lasix today.   5. CAP (community acquired pneumonia) with Influenza - per primary  6. Hyperglycemia -  a1c is 5.5. Likely high in the setting of stress.   Signed, Ahmed, [phone]  Patient examined chart reviewed.  No chest pain Minimal troponin bump in setting of CRF  Refuses cath Due to risk of dialysis .  D/C heparin BP improved No further cardiac recommendations  Jenkins Rouge

## 2015-08-03 NOTE — Progress Notes (Signed)
Pharmacist Heart Failure Core Measure Documentation  Assessment: [name] has an EF documented as 30-35% on 08/02/15  by ECHO.  Rationale: Heart failure patients with left ventricular systolic dysfunction (LVSD) and an EF < 40% should be prescribed an angiotensin converting enzyme inhibitor (ACEI) or angiotensin receptor blocker (ARB) at discharge unless a contraindication is documented in the medical record.  This patient is not currently on an ACEI or ARB for HF.  This note is being placed in the record in order to provide documentation that a contraindication to the use of these agents is present for this encounter.  ACE Inhibitor or Angiotensin Receptor Blocker is contraindicated (specify all that apply)  []   ACEI allergy AND ARB allergy []   Angioedema []   Moderate or severe aortic stenosis []   Hyperkalemia [x]   Hypotension []   Renal artery stenosis [x]   Worsening renal function, preexisting renal disease or dysfunction   [name] 08/03/2015 10:20 AM

## 2015-08-03 NOTE — Progress Notes (Signed)
Pt eating. Will admin scheduled neb tx once finished. RT will continue to monitor.

## 2015-08-03 NOTE — Care Management Note (Signed)
Case Management Note  Patient Details  Name: [name] MRN: [id number] Date of Birth: 06-Aug-1934  Subjective/Objective:          Adm w nstemi          Action/Plan: pt lives at home and states son lives w her. He is one that insisted she come to hospital.    Expected Discharge Date:                  Expected Discharge Plan:  [place]  In-House Referral:  Hospice / Palliative Care  Discharge planning Services     Post Acute Care Choice:    Choice offered to:     DME Arranged:    DME Agency:     HH Arranged:    [place] Agency:     Status of Service:     Medicare Important Message Given:    Date Medicare IM Given:    Medicare IM give by:    Date Additional Medicare IM Given:    Additional Medicare Important Message give by:     If discussed at [place] of Stay Meetings, dates discussed:    Additional Comments: pt states she has always been indep and does all her work around the home. No dc needs at present per pt but will cont to follow for needs.  Lacretia Leigh, RN 08/03/2015, 3:27 PM

## 2015-08-03 NOTE — Progress Notes (Signed)
[facility]                                                                              Patient Demographics  [name], is a 81 y.o. female, DOB - [birth year], [id number]  Admit date - 08/01/2015   Admitting Physician Etta Quill, DO  Outpatient Primary MD for the patient is Lynne Logan, MD  LOS - 2   Chief Complaint  Patient presents with  . Respiratory Distress       Brief HPI   [name] is a 81 y.o. female with h/o CAD, MI, CKD stage 4, CVA (ischemic in 2008, hemorrhagic in 2012), HTN presented to ED with shortness of breath, chest pain and increasing dyspnea, worsening over the last 2 days. No fevers or chills. The patient had been taking a lot of NTG at home for chest pain (5 SL NTG), currently pain free but does get pain with minimal activity (walking to bathroom). Breathing also improved modestly with neb treatments in ED. ED workup showed positive troponin of 0.55, EKG is ischemic, NSTEMI. Cardiology was consulted and patient was placed on heparin drip and admitted to stepdown unit.   Assessment & Plan    Principal Problem:   NSTEMI (non-ST elevated myocardial infarction) [facility]) - Cardiology consulted, no chest pain currently - Patient placed on IV heparin, per cardiology, okay to DC heparin today - continue aspirin, Lipitor, Coreg, Imdur, Ranexa - Patient is refusing cardiac cath due to risk of hemodialysis with stage IV CKD, palliative medicine consulted  Active Problems:   Acute systolic congestive heart failure, NYHA class 4 (HCC) likely exacerbated by hypertensive emergency, noncompliance, pneumonia - BNP> 4000 at the time of admission, continue IV Lasix - Negative balance of 2.48 L, weight down from 170-> 165lbs   - Not on ACEI/ARB due to chronic kidney disease  Influenza A positive - Started on Tamiflu with renal dosing (day #1)    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, NYHA class 4 and CKD stage 4 (HCC) - BP 210/97 at the time of admission - Continue amlodipine, Imdur, Lasix, Coreg - BP now borderline low     CAP (community acquired pneumonia) - Continue IV Zithromax, Rocephin, DuoNeb - Chest x-ray showed new focal right upper lobe opacity, follow-up chest x-ray in 3-4 weeks - Blood cultures  negative to date  -urine legionella antigen, urine strep antigen still pending    acute onCK D stage IV: Likely worsened due to diuresis Baseline creatinine ~3, - Creatinine 4.31, cardiology following,, will reduce Lasix to daily dosing   Hyperglycemia with no underlying history of diabetes -  hemoglobin A1c5.5  Diarrhea - Overnight, C. difficile still pending   Code Status: DO NOT RESUSCITATE   Family Communication: Discussed in detail with the patient, all imaging results, lab results explained to the patient    Disposition Plan:  Transfer to telemetry floor   Time Spent in minutes  44minutes  Procedures  None   Consults   cardiology  DVT Prophylaxis   heparin   Medications  Scheduled Meds: . amLODipine  5 mg  Oral Daily  . aspirin EC  81 mg Oral Daily  . atorvastatin  20 mg Oral q1800  . azithromycin  250 mg Oral Daily  . carvedilol  25 mg Oral BID WC  . cefTRIAXone (ROCEPHIN)  IV  1 g Intravenous Q24H  . furosemide  40 mg Intravenous BID  . ipratropium-albuterol  3 mL Nebulization BID  . isosorbide mononitrate  60 mg Oral Daily  . nitroGLYCERIN  1 inch Topical 4 times per day  . oseltamivir  30 mg Oral Daily  . pneumococcal 23 valent vaccine  0.5 mL Intramuscular Tomorrow-1000  . ranolazine  500 mg Oral BID   Continuous Infusions:   PRN Meds:.acetaminophen, albuterol, nitroGLYCERIN, ondansetron (ZOFRAN) IV   Antibiotics   Anti-infectives    Start     Dose/Rate Route Frequency Ordered Stop   08/03/15 0800  oseltamivir (TAMIFLU) capsule 30 mg    Comments:  Tamiflu 30 mg once daily for CrCl < 30 mL/min   30 mg  Oral Daily 08/03/15 0733 08/08/15 0959   08/03/15 0200  oseltamivir (TAMIFLU) capsule 30 mg     30 mg Oral  Once 08/03/15 0154 08/03/15 0518   08/02/15 2200  cefTRIAXone (ROCEPHIN) 1 g in dextrose 5 % 50 mL IVPB     1 g 100 mL/hr over 30 Minutes Intravenous Every 24 hours 08/01/15 2313     08/02/15 1000  azithromycin (ZITHROMAX) tablet 250 mg     250 mg Oral Daily 08/01/15 2313     08/01/15 2000  azithromycin (ZITHROMAX) 500 mg in dextrose 5 % 250 mL IVPB     500 mg 250 mL/hr over 60 Minutes Intravenous  Once 08/01/15 1949 08/01/15 2139   08/01/15 2000  cefTRIAXone (ROCEPHIN) 1 g in dextrose 5 % 50 mL IVPB     1 g 100 mL/hr over 30 Minutes Intravenous  Once 08/01/15 1949 08/01/15 2036        Subjective:   [name] was seen and examined today. Denies any chest pain or shortness of breath at rest. Patient denies dizziness, chest pain, abdominal pain. Overnight had diarrhea. Denies any nausea and vomiting, fevers or chills.    Objective:   Filed Vitals:   08/03/15 0600 08/03/15 0800 08/03/15 0900 08/03/15 1000  BP: 129/49 128/63  95/49  Pulse: 55 64 63 56  Temp:  98.3 F (36.8 C)    TempSrc:  Oral    Resp: 28 19 26 26   Height:      Weight:      SpO2: 95% 94% 91% 98%    Intake/Output Summary (Last 24 hours) at 08/03/15 1114 Last data filed at 08/03/15 1000  Gross per 24 hour  Intake 556.31 ml  Output   1350 ml  Net -793.69 ml     Wt Readings from Last 3 Encounters:  08/02/15 75 kg (165 lb 5.5 oz)  03/17/14 83.915 kg (185 lb)  02/24/14 86.546 kg (190 lb 12.8 oz)     Exam  General: Alert and oriented x 3, NAD, Speaking in full sentences   HEENT:  PERRLA, EOMI  Neck: Supple, + JVD  CVS: S1 S2 auscultated,Regular rate and rhythm.  Respiratory:  decreased breath sounds at the bases, scattered rhonchi   Abdomen: Soft, nontender, nondistended, + bowel sounds  Ext: no cyanosis clubbing , trace edema  Neuro: no new deficits   Skin: No  rashes  Psych: Normal affect and demeanor, alert and oriented x3    Data Review  Micro Results Recent Results (from the past 240 hour(s))  MRSA PCR Screening     Status: None   Collection Time: 08/02/15  6:47 AM  Result Value Ref Range Status   MRSA by PCR NEGATIVE NEGATIVE Final    Comment:        The GeneXpert MRSA Assay (FDA approved for NASAL specimens only), is one component of a comprehensive MRSA colonization surveillance program. It is not intended to diagnose MRSA infection nor to guide or monitor treatment for MRSA infections.   Culture, blood (routine x 2)     Status: None (Preliminary result)   Collection Time: 08/02/15  1:25 PM  Result Value Ref Range Status   Specimen Description BLOOD RIGHT ARM  Final   Special Requests BOTTLES DRAWN AEROBIC AND ANAEROBIC [place]  Final   Culture NO GROWTH < 24 HOURS  Final   Report Status PENDING  Incomplete  Culture, blood (routine x 2)     Status: None (Preliminary result)   Collection Time: 08/02/15  1:34 PM  Result Value Ref Range Status   Specimen Description BLOOD RIGHT HAND  Final   Special Requests BOTTLES DRAWN AEROBIC AND ANAEROBIC 5CC  Final   Culture NO GROWTH < 24 HOURS  Final   Report Status PENDING  Incomplete    Radiology Reports Dg Chest Port 1 View  08/01/2015  CLINICAL DATA:  Short of breath EXAM: PORTABLE CHEST 1 VIEW COMPARISON:  02/20/2014 FINDINGS: The heart is moderately enlarged. Aorta remains tortuous. A focal opacity in the right upper lung zone has developed. Pulmonary vascularity is within normal limits. No pneumothorax or pleural effusion. IMPRESSION: New focal right upper lobe opacity has developed. Followup PA and lateral chest X-ray is recommended in 3-4 weeks following trial of antibiotic therapy to ensure resolution and exclude underlying malignancy. Electronically Signed   By: Marybelle Killings M.D.   On: 08/01/2015 19:33    CBC  Recent Labs Lab 08/01/15 1922 08/02/15 0334 08/03/15 0300   WBC 7.0 6.9 5.4  HGB 12.1 11.6* 10.2*  HCT 40.2 38.6 33.8*  PLT 189 186 181  MCV 88.0 88.3 87.3  MCH 26.5 26.5 26.4  MCHC 30.1 30.1 30.2  RDW 16.5* 16.4* 16.7*  LYMPHSABS 0.9  --   --   MONOABS 0.6  --   --   EOSABS 0.0  --   --   BASOSABS 0.0  --   --     Chemistries   Recent Labs Lab 08/01/15 1922 08/02/15 0334 08/03/15 0300  NA 139 138 138  K 3.7 4.0 3.9  CL 104 103 103  CO2 20* 21* 23  GLUCOSE 128* 103* 115*  BUN 32* 34* 58*  CREATININE 2.84* 3.06* 4.31*  CALCIUM 8.4* 8.0* 7.4*   ------------------------------------------------------------------------------------------------------------------ estimated creatinine clearance is 10.9 mL/min (by C-G formula based on Cr of 4.31). ------------------------------------------------------------------------------------------------------------------  Recent Labs  08/02/15 0948  HGBA1C 5.5   ------------------------------------------------------------------------------------------------------------------ No results for input(s): CHOL, HDL, LDLCALC, TRIG, CHOLHDL, LDLDIRECT in the last 72 hours. ------------------------------------------------------------------------------------------------------------------ No results for input(s): TSH, T4TOTAL, T3FREE, THYROIDAB in the last 72 hours.  Invalid input(s): FREET3 ------------------------------------------------------------------------------------------------------------------ No results for input(s): VITAMINB12, FOLATE, FERRITIN, TIBC, IRON, RETICCTPCT in the last 72 hours.  Coagulation profile No results for input(s): INR, PROTIME in the last 168 hours.  No results for input(s): DDIMER in the last 72 hours.  Cardiac Enzymes  Recent Labs Lab 08/01/15 2328 08/02/15 0334 08/02/15 0948  TROPONINI 0.73* 0.81* 0.84*   ------------------------------------------------------------------------------------------------------------------ Invalid input(s): POCBNP  No results  for input(s): GLUCAP in the last 72 hours.   Megan Hayduk M.D. [facility] 08/03/2015, 11:14 AM  Pager: [phone] Between 7am to 7pm - call Pager - [phone]  After 7pm go to [email or web address] - password [email or web address]  Call night coverage person covering after 7pm

## 2015-08-03 NOTE — Consult Note (Signed)
Consultation Note Date: 08/03/2015   Jarvis Name: [name]  DOB: 11-Nov-1934  MRN: [id number]  Age / Sex: 81 y.o., female  PCP: Donald Prose, MD Referring Physician: Mendel Corning, MD  Reason for Consultation: Establishing goals of care  HPI:  62 yof with mixed heart failure, Hx of both hemorrhagic and ischemic stroke, CKD IV, and COPD presented to Sarah ER on 2/14 and was admitted with NSTEMI, decompensated CHF, PNA, and was subsequently found to have influenza A.  Cardiology recommended cardiac cath, Jarvis refused due to risk of worsening renal failure.  Clinical Assessment/Narrative: [name] is a pleasant but feisty independent spirit. She tells me she is 55% [place] and that she married in [place]. She lives with her son (who smokes) and takes care of him. She tells me that she is mobile and independent with ADLs at home.  I asked permission to speak with her son and she refused, stating, "I make my and decisions".  She has no living will or advanced directives. When we discussed dying in general terms she stated "I am not going anywhere" and "a heart attack while necessarily kill me. I've had 3 before".  She states that she would not like to be placed on life support or resuscitated she prefers to die naturally.    She describes having difficulty obtaining her medications. She states she has been prescribed nebulizers in Sarah past but they don't carry them at Sarah 3 pharmacies she has checked.  She mentions financial difficulties (paying for co-pays and medications, doctor's visits) and states she has tried to apply for Medicaid.   Even at rest [name] has increased work of breathing. I asked her if she would like a small dose of morphine to ease her breathing and she agrees to this. I have ordered morphine 1 mg prn dyspnea.  I will continue to follow [name] while she is in Sarah  hospital to further refine her goals of care. She clearly has a prognosis of less than 6 months and is eligible for hospice services at home if she needs them. Hospice may help with her medications as well.   Contacts/Participants in Discussion: [name]  Primary Decision Maker:  [name]  HCPOA:   none   SUMMARY OF RECOMMENDATIONS  Code Status/Advance Care Planning: DNR   Other Directives:None  I will continue to work with [name] to develop her advanced directives.  Symptom Management:   Dyspnea-morphine PRN, hold for systolic blood pressure less than 100    Additional Recommendations (Limitations, Scope, Preferences):  I will continue to follow [name] during this hospitalization to further refine her goals of care and advanced directives   Psycho-social/Spiritual:  Support System: [place] Desire for further Chaplaincy support:no Additional Recommendations: Caregiving  Support/Resources, Hospice Education  Prognosis:  less than 6 months due to mixed heart failure, chronic kidney disease stage IV and current acute illness.  Discharge Planning: Home with Home Health if [name] will accept services.   Chief Complaint/ Primary Diagnoses: Present on Admission:  . NSTEMI (non-ST elevated myocardial infarction) ([place]) . Acute systolic congestive heart failure, NYHA class 4 ([place]) . 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, NYHA class 4 and CKD stage 4 ([place]) . CAP (community acquired pneumonia)  I have reviewed Sarah medical record, interviewed [name] and family, and examined [name]. Sarah following aspects are pertinent.  Past Medical History  Diagnosis Date  . Myocardial  infarction ([place])   . Hypertension   . Chronic combined systolic and diastolic CHF (congestive heart failure) ([place])     a. EF 45-50% in 0000000. b. Mixed systolic/diastolic - EF [id number] by echo 2014, 35% by nuc.  Marland Kitchen Anemia   . Stroke [facility])       a. 2008: acute stroke in left centrum ovale. 06/2010: hemorrhagic stroke.  Marland Kitchen GERD (gastroesophageal reflux disease)   . Headache(784.0)   . Arthritis   . Depression   . CKD (chronic kidney disease), stage IV ([place]) 08/29/2012  . Cancer (HCC)     scalp  . Nephrolithiasis   . Hx of peptic ulcer   . Osteoarthritis, generalized   . Gastrointestinal hemorrhage     a. Pt reports she has h/o this many years ago, requiring 2 units of blood. Thinks it was in [place] but records not available. She does not know why this happened.  Marland Kitchen CAD (coronary artery disease)   . H/O medication noncompliance     a. Per notes, due to cost.   . CAD (coronary artery disease)     a. Stents in [facility] 2001 and cath 2008 with possible balloon or stent at [facility] (not available in [place]).   . Hemorrhoid     a. Mild hemorrhoidal bleeding per previous DC note.  Marland Kitchen COPD (chronic obstructive pulmonary disease) ([place])    Social History   Social History  . Marital Status: Divorced    Spouse Name: N/A  . Number of Children: N/A  . Years of Education: N/A   Social History Main Topics  . Smoking status: Current Every Day Smoker    Types: Cigarettes  . Smokeless tobacco: Never Used  . Alcohol Use: No  . Drug Use: No  . Sexual Activity: Not Asked   Other Topics Concern  . None   Social History Narrative   Graduate [employer]   Married '59-30 yrs/divorced   2 sons, '61 '62; 8 grandchildren   Works as Optometrist, Field seismologist   Retired-lives in her own home and her son lives with her.    Family History  Problem Relation Age of Onset  . Diabetes Mother   . Hypertension Mother   . Diabetes Sister   . Cancer Son     breast  . Diabetes Sister    Scheduled Meds: . amLODipine  5 mg Oral Daily  . aspirin EC  81 mg Oral Daily  . atorvastatin  20 mg Oral q1800  . azithromycin  250 mg Oral Daily  . carvedilol  25 mg Oral BID WC  . cefTRIAXone (ROCEPHIN)  IV  1 g Intravenous Q24H  .  [START ON 08/04/2015] furosemide  40 mg Intravenous Daily  . ipratropium-albuterol  3 mL Nebulization BID  . isosorbide mononitrate  60 mg Oral Daily  . oseltamivir  30 mg Oral Daily  . pneumococcal 23 valent vaccine  0.5 mL Intramuscular Tomorrow-1000  . ranolazine  500 mg Oral BID   Continuous Infusions:  PRN Meds:.acetaminophen, albuterol, morphine injection, nitroGLYCERIN, ondansetron (ZOFRAN) IV Medications Prior to Admission:  Prior to Admission medications   Medication Sig Start Date End Date Taking? Authorizing Provider  amLODipine (NORVASC) 5 MG tablet Take 1 tablet (5 mg total) by mouth daily. 03/24/14  Yes Darlin Coco, MD  aspirin 81 MG chewable tablet Chew 1 tablet (81 mg total) by mouth daily. 09/02/12  Yes Neena Rhymes, MD  atorvastatin (LIPITOR) 20 MG tablet Take 1 tablet (20 mg total) by  mouth daily at 6 PM. 04/04/14  Yes Darlin Coco, MD  carvedilol (COREG) 25 MG tablet Take 1 tablet (25 mg total) by mouth 2 (two) times daily with a meal. 04/04/14  Yes Darlin Coco, MD  furosemide (LASIX) 40 MG tablet Take 40 mg by mouth daily.   Yes Historical Provider, MD  isosorbide mononitrate (IMDUR) 60 MG 24 hr tablet Take 1 tablet (60 mg total) by mouth daily. 04/04/14  Yes Darlin Coco, MD  nitroGLYCERIN (NITROSTAT) 0.4 MG SL tablet Place 1 tablet (0.4 mg total) under Sarah tongue every 5 (five) minutes as needed for chest pain. 03/17/14  Yes Darlin Coco, MD  ranolazine (RANEXA) 500 MG 12 hr tablet Take 1 tablet (500 mg total) by mouth 2 (two) times daily. 04/04/14  Yes Darlin Coco, MD   No Known Allergies  Review of Systems  Constitutional: Positive for activity change, appetite change and fatigue.  HENT: Negative.   Eyes: Negative.   Respiratory: Positive for chest tightness and shortness of breath.   Cardiovascular: Positive for chest pain.  Gastrointestinal: Positive for diarrhea.  Endocrine: Negative.   Genitourinary: Negative.   Musculoskeletal:  Positive for arthralgias.  Skin: Positive for color change.  Allergic/Immunologic: Negative.   Neurological: Positive for weakness.  Hematological: Negative.   Psychiatric/Behavioral: Negative.     Physical Exam Elderly female, sitting in recliner chair watching TV, oxygen in place Resp:  Mild - mod increased work of breathing at rest.  Decreased breath sounds CV:  Difficult to hear but no M/R/G Abdomen:  Soft, NT, +BS Skin:  Bruising on upper extremities, tenting.  Vital Signs: BP 107/46 mmHg  Pulse 57  Temp(Src) 97.1 F (36.2 C) (Oral)  Resp 17  Ht 5' 4.96" (1.65 m)  Wt 75 kg (165 lb 5.5 oz)  BMI 27.55 kg/m2  SpO2 95%  SpO2: SpO2: 95 % O2 Device:SpO2: 95 % O2 Flow Rate: .O2 Flow Rate (L/min): 2 L/min  IO: Intake/output summary:  Intake/Output Summary (Last 24 hours) at 08/03/15 1502 Last data filed at 08/03/15 1124  Gross per 24 hour  Intake 514.31 ml  Output   1050 ml  Net -535.69 ml    LBM:   Baseline Weight: Weight: 77.111 kg (170 lb) Most recent weight: Weight: 75 kg (165 lb 5.5 oz)      Palliative Assessment/Data:  Flowsheet Rows        Most Recent Value   Intake Tab    Referral Department  Hospitalist   Unit at Time of Referral  Intermediate Care Unit   Palliative Care Primary Diagnosis  Cardiac   Date Notified  08/02/15   Palliative Care Type  New Palliative care   Reason for referral  Clarify Goals of Care   Date of Admission  08/01/15   Date first seen by Palliative Care  08/03/15   # of days Palliative referral response time  1 Day(s)   # of days IP prior to Palliative referral  1   Clinical Assessment    Palliative Performance Scale Score  50%   Dyspnea Max Last 24 Hours  7   Dyspnea Min Last 24 hours  3   Psychosocial & Spiritual Assessment    Palliative Care Outcomes    Jarvis/Family meeting held?  Yes   Who was at Sarah meeting?  Jarvis   Palliative Care Outcomes  Clarified goals of care, Improved non-pain symptom therapy       Additional Data Reviewed:  CBC:    Component Value Date/Time  WBC 5.4 08/03/2015 0300   HGB 10.2* 08/03/2015 0300   HCT 33.8* 08/03/2015 0300   PLT 181 08/03/2015 0300   MCV 87.3 08/03/2015 0300   NEUTROABS 5.3 08/01/2015 1922   LYMPHSABS 0.9 08/01/2015 1922   MONOABS 0.6 08/01/2015 1922   EOSABS 0.0 08/01/2015 1922   BASOSABS 0.0 08/01/2015 1922   Comprehensive Metabolic Panel:    Component Value Date/Time   NA 138 08/03/2015 0300   K 3.9 08/03/2015 0300   CL 103 08/03/2015 0300   CO2 23 08/03/2015 0300   BUN 58* 08/03/2015 0300   CREATININE 4.31* 08/03/2015 0300   GLUCOSE 115* 08/03/2015 0300   CALCIUM 7.4* 08/03/2015 0300   AST 20 02/20/2014 1018   ALT 8 02/20/2014 1018   ALKPHOS 63 02/20/2014 1018   BILITOT 0.2* 02/20/2014 1018   PROT 6.6 02/20/2014 1018   ALBUMIN 3.2* 02/20/2014 1018     Time In: 2:00 Time Out: 3:10 Time Total: 70 min Greater than 50%  of this time was spent counseling and coordinating care related to Sarah above assessment and plan.  Signed by:  Imogene Burn, PA-C Palliative Medicine Pager: [phone]   08/03/2015, 3:02 PM  Please contact Palliative Medicine Team phone at [phone] for questions and concerns.

## 2015-08-04 ENCOUNTER — Encounter (HOSPITAL_COMMUNITY): Payer: Self-pay | Admitting: General Practice

## 2015-08-04 DIAGNOSIS — Z7189 Other specified counseling: Secondary | ICD-10-CM

## 2015-08-04 LAB — BASIC METABOLIC PANEL
Anion gap: 16 — ABNORMAL HIGH (ref 5–15)
BUN: 77 mg/dL — AB (ref 6–20)
CHLORIDE: 101 mmol/L (ref 101–111)
CO2: 20 mmol/L — ABNORMAL LOW (ref 22–32)
Calcium: 7 mg/dL — ABNORMAL LOW (ref 8.9–10.3)
Creatinine, Ser: 5.57 mg/dL — ABNORMAL HIGH (ref 0.44–1.00)
GFR calc Af Amer: 8 mL/min — ABNORMAL LOW (ref >60)
GFR calc non Af Amer: 7 mL/min — ABNORMAL LOW (ref >60)
GLUCOSE: 93 mg/dL (ref 65–99)
Potassium: 3.9 mmol/L (ref 3.5–5.1)
SODIUM: 137 mmol/L (ref 135–145)

## 2015-08-04 LAB — CBC
HCT: 31.4 % — ABNORMAL LOW (ref 36.0–46.0)
Hemoglobin: 9.8 g/dL — ABNORMAL LOW (ref 12.0–15.0)
MCH: 27.4 pg (ref 26.0–34.0)
MCHC: 31.2 g/dL (ref 30.0–36.0)
MCV: 87.7 fL (ref 78.0–100.0)
PLATELETS: 163 10*3/uL (ref 150–400)
RBC: 3.58 MIL/uL — ABNORMAL LOW (ref 3.87–5.11)
RDW: 16.8 % — ABNORMAL HIGH (ref 11.5–15.5)
WBC: 3.4 10*3/uL — ABNORMAL LOW (ref 4.0–10.5)

## 2015-08-04 NOTE — Care Management Note (Signed)
Case Management Note  Patient Details  Name: [name] MRN: [id number] Date of Birth: 06/13/1935  Subjective/Objective:       Admitted with NSTEMI           Action/Plan: CM talked to patient about DCP; Patient lives with her son and stated that she does not need any HHC at this time. CM discussed the importance of having [place] services, but patient graciously refused, stated " I dont need that honey." CM informed patient that if she changed her mind her PCP can make arrangements from his office. She has a walker at home, no home oxygen at this time.  Expected Discharge Date:    possibly 08/07/2015              Expected Discharge Plan:  [place]  In-House Referral:  Hospice / Palliative Care   Choice offered to:  Patient  HH Arranged:  Patient Refused  Status of Service:  In process, will continue to follow  Sherrilyn Rist [id number] 08/04/2015, 11:28 AM

## 2015-08-04 NOTE — Progress Notes (Signed)
[facility]                                                                              Patient Demographics  [name], is a 81 y.o. female, DOB - 1935/04/28, [id number]  Admit date - 08/01/2015   Admitting Physician Etta Quill, DO  Outpatient Primary MD for the patient is Lynne Logan, MD  LOS - 3   Chief Complaint  Patient presents with  . Respiratory Distress       Brief HPI   [name] is a 81 y.o. female with h/o CAD, MI, CKD stage 4, CVA (ischemic in 2008, hemorrhagic in 2012), HTN presented to ED with shortness of breath, chest pain and increasing dyspnea, worsening over the last 2 days. No fevers or chills. The patient had been taking a lot of NTG at home for chest pain (5 SL NTG), currently pain free but does get pain with minimal activity (walking to bathroom). Breathing also improved modestly with neb treatments in ED. ED workup showed positive troponin of 0.55, EKG is ischemic, NSTEMI. Cardiology was consulted and patient was placed on heparin drip and admitted to stepdown unit.   Assessment & Plan    Principal Problem:   NSTEMI (non-ST elevated myocardial infarction) [facility]) - Cardiology consulted, no chest pain currently - Patient was placed on IV heparin for 48 hours - continue aspirin, Lipitor, Coreg, Imdur, Ranexa - Patient refused cardiac cath, due to risk of hemodialysis with stage IV CKD.  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   Hyperglycemia with no underlying history of diabetes -  hemoglobin A1c5.5  Diarrhea - Resolved, no BM for over 24 hours, discontinued C. difficile order   Code Status: DO NOT RESUSCITATE   Family Communication: Discussed in detail with the patient, all imaging results, lab results explained to the patient    Disposition Plan:  Appreciate palliative recommendations, possible dc 24-48 hours  Time Spent in minutes  20minutes  Procedures  None  Consults   cardiology  DVT Prophylaxis   heparin   Medications  Scheduled Meds: . amLODipine  5 mg Oral Daily  . aspirin EC  81 mg Oral Daily  . atorvastatin  20 mg Oral q1800  . azithromycin  250 mg Oral Daily  . carvedilol  25 mg Oral BID WC  . cefTRIAXone (ROCEPHIN)  IV  1 g Intravenous Q24H  . ipratropium-albuterol  3 mL Nebulization BID  . isosorbide mononitrate  60 mg Oral Daily  . oseltamivir  30 mg Oral Daily  . pneumococcal 23 valent vaccine  0.5 mL Intramuscular Tomorrow-1000  . ranolazine  500 mg Oral BID   Continuous Infusions:   PRN Meds:.acetaminophen, albuterol, morphine injection, nitroGLYCERIN, ondansetron (ZOFRAN) IV   Antibiotics   Anti-infectives    Start     Dose/Rate Route Frequency Ordered Stop   08/03/15 0800  oseltamivir (TAMIFLU) capsule 30 mg     Comments:  Tamiflu 30 mg once daily for CrCl < 30 mL/min   30 mg Oral Daily 08/03/15 0733 08/08/15 0959   08/03/15 0200  oseltamivir (TAMIFLU) capsule 30 mg     30 mg Oral  Once 08/03/15 0154 08/03/15 0518   08/02/15 2200  cefTRIAXone (ROCEPHIN) 1 g in dextrose 5 % 50 mL IVPB     1 g 100 mL/hr over 30 Minutes Intravenous Every 24 hours 08/01/15 2313     08/02/15 1000  azithromycin (ZITHROMAX) tablet 250 mg     250 mg Oral Daily 08/01/15 2313     08/01/15 2000  azithromycin (ZITHROMAX) 500 mg in dextrose 5 % 250 mL IVPB     500 mg 250 mL/hr over 60 Minutes Intravenous  Once 08/01/15 1949 08/01/15 2139   08/01/15 2000  cefTRIAXone (ROCEPHIN) 1 g in dextrose 5 % 50 mL IVPB     1 g 100 mL/hr over 30 Minutes Intravenous  Once 08/01/15 1949 08/01/15 2036        Subjective:   [name] was seen and examined today. Currently denies any chest pain. No worsening of shortness of breath. No nausea, vomiting or diarrhea. Patient denies dizziness, chest pain, abdominal pain.  Denies fevers or chills.    Objective:   Filed Vitals:   08/03/15 1949 08/03/15 2114 08/04/15 0518 08/04/15 0817  BP:  109/42 127/46 137/49  Pulse:  57 60 63  Temp:  98.2 F (36.8 C) 97.5 F (36.4 C) 98.2 F (36.8 C)  TempSrc:   Oral Oral  Resp:  20 20 20   Height:      Weight:   76.703 kg (169 lb 1.6 oz)   SpO2: 98% 96% 93% 92%    Intake/Output Summary (Last 24 hours) at 08/04/15 1119 Last data filed at 08/04/15 1020  Gross per 24 hour  Intake    480 ml  Output    650 ml  Net   -170 ml     Wt Readings from Last 3 Encounters:  08/04/15 76.703 kg (169 lb 1.6 oz)  03/17/14 83.915 kg (185 lb)  02/24/14 86.546 kg (190 lb 12.8 oz)     Exam  General: Alert and oriented x 3, NAD, Speaking in full sentences   HEENT:  PERRLA, EOMI  Neck: Supple, + JVD  CVS: S1 S2 auscultated,Regular rate and rhythm.  Respiratory: Diminished breath sounds at the bases, no rhonchi or wheezing  Abdomen: Soft,  nontender, nondistended, + bowel sounds  Ext: no cyanosis clubbing , no edema  Neuro: no new deficits  Skin: No rashes  Psych: Normal affect and demeanor, alert and oriented x3    Data Review   Micro Results Recent Results (from the past 240 hour(s))  MRSA PCR Screening     Status: None   Collection Time: 08/02/15  6:47 AM  Result Value Ref Range Status   MRSA by PCR NEGATIVE NEGATIVE Final    Comment:        The GeneXpert MRSA Assay (FDA approved for NASAL specimens only), is one component of a comprehensive MRSA colonization surveillance program. It is not intended to diagnose MRSA infection nor to guide or monitor treatment for MRSA infections.   Culture, blood (routine x 2)     Status: None (Preliminary result)   Collection Time: 08/02/15  1:25 PM  Result Value Ref Range Status   Specimen Description BLOOD RIGHT ARM  Final   Special Requests BOTTLES DRAWN AEROBIC AND ANAEROBIC [place]  Final   Culture NO GROWTH 1 DAY  Final   Report Status PENDING  Incomplete  Culture, blood (routine x 2)     Status: None (Preliminary result)   Collection Time: 08/02/15  1:34 PM  Result Value Ref Range Status   Specimen Description BLOOD RIGHT HAND  Final   Special Requests BOTTLES DRAWN AEROBIC AND ANAEROBIC 5CC  Final   Culture NO GROWTH 1 DAY  Final   Report Status PENDING  Incomplete    Radiology Reports Dg Chest Port 1 View  08/01/2015  CLINICAL DATA:  Short of breath EXAM: PORTABLE CHEST 1 VIEW COMPARISON:  02/20/2014 FINDINGS: The heart is moderately enlarged. Aorta remains tortuous. A focal opacity in the right upper lung zone has developed. Pulmonary vascularity is within normal limits. No pneumothorax or pleural effusion. IMPRESSION: New focal right upper lobe opacity has developed. Followup PA and lateral chest X-ray is recommended in 3-4 weeks following trial of antibiotic therapy to ensure resolution and exclude underlying malignancy. Electronically Signed   By: Marybelle Killings M.D.   On: 08/01/2015 19:33    CBC  Recent Labs Lab 08/01/15 1922 08/02/15 0334 08/03/15 0300 08/04/15 0420  WBC 7.0 6.9 5.4 3.4*  HGB 12.1 11.6* 10.2* 9.8*  HCT 40.2 38.6 33.8* 31.4*  PLT 189 186 181 163  MCV 88.0 88.3 87.3 87.7  MCH 26.5 26.5 26.4 27.4  MCHC 30.1 30.1 30.2 31.2  RDW 16.5* 16.4* 16.7* 16.8*  LYMPHSABS 0.9  --   --   --   MONOABS 0.6  --   --   --   EOSABS 0.0  --   --   --   BASOSABS 0.0  --   --   --     Chemistries   Recent Labs Lab 08/01/15 1922 08/02/15 0334 08/03/15 0300 08/04/15 0420  NA 139 138 138 137  K 3.7 4.0 3.9 3.9  CL 104 103 103 101  CO2 20* 21* 23 20*  GLUCOSE 128* 103* 115* 93  BUN 32* 34* 58* 77*  CREATININE 2.84* 3.06* 4.31* 5.57*  CALCIUM 8.4* 8.0* 7.4* 7.0*   ------------------------------------------------------------------------------------------------------------------ estimated creatinine clearance is 8.5 mL/min (by C-G formula based on Cr of 5.57). ------------------------------------------------------------------------------------------------------------------  Recent Labs  08/02/15 0948  HGBA1C 5.5   ------------------------------------------------------------------------------------------------------------------ No results for input(s): CHOL, HDL, LDLCALC, TRIG, CHOLHDL, LDLDIRECT in the last 72 hours. ------------------------------------------------------------------------------------------------------------------ No results for input(s): TSH, T4TOTAL, T3FREE, THYROIDAB in the last 72 hours.  Invalid input(s): FREET3 ------------------------------------------------------------------------------------------------------------------ No results for input(s): VITAMINB12, FOLATE, FERRITIN, TIBC, IRON, RETICCTPCT in the last 72  hours.  Coagulation profile No results for input(s): INR, PROTIME in the last 168 hours.  No results for input(s): DDIMER in the last 72 hours.  Cardiac Enzymes  Recent Labs Lab  08/01/15 2328 08/02/15 0334 08/02/15 0948  TROPONINI 0.73* 0.81* 0.84*   ------------------------------------------------------------------------------------------------------------------ Invalid input(s): POCBNP  No results for input(s): GLUCAP in the last 72 hours.   Yulieth Carrender M.D. [facility] 08/04/2015, 11:19 AM  Pager: [phone] Between 7am to 7pm - call Pager - [phone]  After 7pm go to [email or web address] - password [email or web address]  Call night coverage person covering after 7pm

## 2015-08-04 NOTE — Progress Notes (Signed)
Pt a/o, no c/o pain or SOB, VSS, abx given as ordered, pt resting comfortably, pt stable

## 2015-08-04 NOTE — Progress Notes (Signed)
SATURATION QUALIFICATIONS: (This note is used to comply with regulatory documentation for home oxygen)  Patient Saturations on Room Air at Rest = 86-92%  Patient Saturations on Room Air while Ambulating = 80-85 %  Patient Saturations on 3 Liters of oxygen while Ambulating = 90-93% with pursed lip breathing.  If not pursed lip breathing, O2 87-90%  Please briefly explain why patient needs home oxygen:Pt needs home O2 due to desaturations at rest and with activity.  THanks.  [place] [phone] (pager)

## 2015-08-04 NOTE — Progress Notes (Signed)
Subjective: No chest pain, stated her breathing was Ok   Objective: Vital signs in last 24 hours: Temp:  [97.1 F (36.2 C)-98.2 F (36.8 C)] 98.2 F (36.8 C) (02/17 0817) Pulse Rate:  [54-66] 63 (02/17 0817) Resp:  [17-29] 20 (02/17 0817) BP: (95-137)/(42-65) 137/49 mmHg (02/17 0817) SpO2:  [92 %-100 %] 92 % (02/17 0817) FiO2 (%):  [2 %] 2 % (02/16 1900) Weight:  [169 lb 1.6 oz (76.703 kg)] 169 lb 1.6 oz (76.703 kg) (02/17 0518) Weight change:    Intake/Output from previous day: -382 02/16 0701 - 02/17 0700 In: 505 [P.O.:480; I.V.:25] Out: 900 [Urine:900] Intake/Output this shift:    PE: General:Pleasant affect, NAD Skin:Warm and dry, brisk capillary refill HEENT:normocephalic, sclera clear, mucus membranes moist, hard of hearing Heart:S1S2 RRR without murmur, gallup, rub or click Lungs:diminished without rales, rhonchi, or wheezes [id number], non tender, + BS, do not palpate liver spleen or masses Ext:no lower ext edema, 2+ pedal pulses, 2+ radial pulses Neuro:alert and oriented X 3, MAE, follows commands, + facial symmetry Tele:  SR to SB rate to 39 at times.   Lab Results:  Recent Labs  08/03/15 0300 08/04/15 0420  WBC 5.4 3.4*  HGB 10.2* 9.8*  HCT 33.8* 31.4*  PLT 181 163   BMET  Recent Labs  08/03/15 0300 08/04/15 0420  NA 138 137  K 3.9 3.9  CL 103 101  CO2 23 20*  GLUCOSE 115* 93  BUN 58* 77*  CREATININE 4.31* 5.57*  CALCIUM 7.4* 7.0*    Recent Labs  08/02/15 0334 08/02/15 0948  TROPONINI 0.81* 0.84*    Lab Results  Component Value Date   CHOL 115 08/27/2012   HDL 54 08/27/2012   LDLCALC 36 08/27/2012   TRIG 126 08/27/2012   CHOLHDL 2.1 08/27/2012   Lab Results  Component Value Date   HGBA1C 5.5 08/02/2015     Lab Results  Component Value Date   TSH 1.420 02/20/2014       Studies/Results: ECHO: Study Conclusions  - Left ventricle: The cavity size was normal. There was moderate concentric hypertrophy.  Systolic function was moderately to severely reduced. The estimated ejection fraction was in the range of 30% to 35%. Diffuse hypokinesis. Doppler parameters are consistent with abnormal left ventricular relaxation (grade 1 diastolic dysfunction). Doppler parameters are consistent with elevated ventricular end-diastolic filling pressure. - Ventricular septum: Septal motion showed abnormal function, dyssynergy, and paradox. - Aortic valve: Trileaflet; normal thickness leaflets. There was no regurgitation. - Aortic root: The aortic root was normal in size. - Mitral valve: Calcified annulus. There was no regurgitation. - Left atrium: The atrium was mildly dilated. - Right ventricle: The cavity size was normal. Wall thickness was normal. Systolic function was mildly reduced. - Tricuspid valve: There was no regurgitation. - Pulmonary arteries: Systolic pressure was within the normal range. - Inferior vena cava: The vessel was normal in size. - Pericardium, extracardiac: There was no pericardial effusion.   Medications: I have reviewed the patient's current medications. Scheduled Meds: . amLODipine  5 mg Oral Daily  . aspirin EC  81 mg Oral Daily  . atorvastatin  20 mg Oral q1800  . azithromycin  250 mg Oral Daily  . carvedilol  25 mg Oral BID WC  . cefTRIAXone (ROCEPHIN)  IV  1 g Intravenous Q24H  . ipratropium-albuterol  3 mL Nebulization BID  . isosorbide mononitrate  60 mg Oral Daily  . oseltamivir  30  mg Oral Daily  . pneumococcal 23 valent vaccine  0.5 mL Intramuscular Tomorrow-1000  . ranolazine  500 mg Oral BID   Continuous Infusions:  PRN Meds:.acetaminophen, albuterol, morphine injection, nitroGLYCERIN, ondansetron (ZOFRAN) IV  Assessment/Plan: Principal Problem:   NSTEMI (non-ST elevated myocardial infarction) ([place]) Active Problems:   Acute systolic congestive heart failure, NYHA class 4 (HCC)   Malignant hypertensive heart and kidney disease with  combined systolic and diastolic CHF, NYHA class 4 and CKD stage 4 (HCC)   CAP (community acquired pneumonia)   Acute on chronic combined systolic and diastolic heart failure ([place])   Community acquired pneumonia   Palliative care encounter   Advanced directives, counseling/discussion   Encounter for hospice care discussion  This is a 81 y.o. female with CAD, MI first in 2001, CKD stage 4, CVA (ischemic in 2008, hemorrhagic in 2012), HTN and ischemic cardiomyopathy (LVEF 25-30%), DNR and poor compliance with the medications who presented with chest pain. Patient is admitted under triad hospitalist service and is being managed for HTN urgency, community acquired pneumonia and NSTEMI.   1. NSTEMI vs trop elevation 2/2 to hypertensive urgency - trop peaked at 0.84.  Has refused cath as she does not want to be on dialysis.  -  Continue ASA, lipitor, coreg, imdur, and renexa. As patient does not want cath, can likely discharge with medical management. - Palliative consult done- .treat SOB with morphine -pt is DNR  --pt has trouble affording Ranexa she would like to stop- review with MD  2. Acute systolic congestive heart failure, NYHA class 4 exacerbated by HTN emergency, medication non compliance - BNP > 4000. Diuresing well - Last echo 9/15 showed ef of 25-30%, Repeat ECHO showed EF 30-35%.  - Address medication compliance and importance, seems not interested. Without medication compliance hard to tell if EF would have improved. Now dosse meet criteria for ICD placement. Would discuss further with attending.  - Not on ACE/ARB due to CKD. Could consider adding hydralazine to her imdur if BP goes up, now BP may not tolerate this.  - negative 2890 since admit wt down 1 lb.   3. Hypertensive urgency - now normotensive after home meds.  - compliance with meds would be important.   4. CKD, stage 5 - baseline Scr of ~3. Now up to 4.31-->5.57, likely 2/2 to overdiuresis. Consider reducing  lasix today or holding.   5. CAP (community acquired pneumonia) with Influenza - per primary  6. Hyperglycemia - a1c is 5.5. Likely high in the setting of stress.   LOS: 3 days   Time spent with pt. :15 minutes. [facility] R  Nurse Practitioner Certified Pager [phone] or after 5pm and on weekends call [phone] 08/04/2015, 9:50 AM   Patient examined chart reviewed.  Patient chronically ill with stage 5 kidney disease Refusing cath as she  Does not want to be on dialysis . Continue medical Rx.  Non compliant with meds which does not help With volume overload and HTN.  Agree with palliative care and DNR  Will sign off  Jenkins Rouge

## 2015-08-04 NOTE — Evaluation (Signed)
Physical Therapy Evaluation Patient Details Name: [name] MRN: [id number] DOB: June 10, 1935 Today's Date: 08/04/2015   History of Present Illness  [name] is a 81 y.o. female with h/o CAD, MI, CKD stage 4, CVA (ischemic in 2008, hemorrhagic in 2012), HTN presented to ED with shortness of breath, chest pain and increasing dyspnea, worsening over the last 2 days. No fevers or chills. The patient had been taking a lot of NTG at home for chest pain (5 SL NTG), currently pain free but does get pain with minimal activity (walking to bathroom). Breathing also improved modestly with neb treatments in ED.  Clinical Impression  Pt admitted with above diagnosis. Pt currently with functional limitations due to the deficits listed below (see PT Problem List). Pt was able to ambulate but with limited ambulation due to dizziness, poor balance and desaturations.  Pt adamant taht she is going home with children.  Feel SNF stay would benefit pt and tried to convince pt but pt is adamant she is going home and does not need O2 even though she will have to have O2 due to desaturation.   Pt will benefit from skilled PT to increase their independence and safety with mobility to allow discharge to the venue listed below.      Follow Up Recommendations SNF;Supervision/Assistance - 24 hour (If refuses SNF will need HHPT,HHOT, HHaide)    Equipment Recommendations  Home O2   Recommendations for Other Services       Precautions / Restrictions Precautions Precautions: Fall Precaution Comments: watch O2 Restrictions Weight Bearing Restrictions: No      Mobility  Bed Mobility Overal bed mobility: Needs Assistance Bed Mobility: Supine to Sit     Supine to sit: Min assist     General bed mobility comments: Needed assist for LEs on and off bed.   Transfers Overall transfer level: Needs assistance Equipment used: None Transfers: Sit to/from Stand Sit to Stand: Min assist;+2  safety/equipment         General transfer comment: Pt generally unsteady on her feet needing steadying assist.  Pt losing balance posteriorly but appears unaware.  Refused to use RW.  "If I fall, I fall,"pt stated.    Ambulation/Gait Ambulation/Gait assistance: Min assist;+2 safety/equipment Ambulation Distance (Feet): 18 Feet Assistive device: None Gait Pattern/deviations: Step-through pattern;Decreased stride length;Shuffle;Antalgic;Wide base of support;Trunk flexed   Gait velocity interpretation: Below normal speed for age/gender General Gait Details: Pt with generally unsteady gait leaning posteriorly needing min assisst multiple times to prevent falls.  Pt would not use RW even though PT encouraged the rW for safety.  Pt states she catches hold to furniture.   Stairs            Wheelchair Mobility    Modified Rankin (Stroke Patients Only)       Balance Overall balance assessment: Needs assistance;History of Falls Sitting-balance support: No upper extremity supported;Feet supported Sitting balance-Leahy Scale: Good   Postural control: Posterior lean Standing balance support: No upper extremity supported;During functional activity Standing balance-Leahy Scale: Poor Standing balance comment: Pt loses balance posterior in stanidng if does not have bil UE support.                               Pertinent Vitals/Pain Pain Assessment: No/denies pain    SATURATION QUALIFICATIONS: (This note is used to comply with regulatory documentation for home oxygen)  Patient Saturations on Room Air at Rest =  86-92%  Patient Saturations on Room Air while Ambulating = 80-85 %  Patient Saturations on 3 Liters of oxygen while Ambulating = 90-93% with pursed lip breathing.  If not pursed lip breathing, O2 87-90%  Please briefly explain why patient needs home oxygen:Pt needs home O2 due to desaturations at rest and with activity.  Home Living Family/patient expects to be  discharged to:: Private residence Living Arrangements: Children Available Help at Discharge: Family;Available 24 hours/day Type of Home: House Home Access: Level entry     Home Layout: One level Home Equipment: Bedside commode;Walker - 2 wheels;Shower seat Additional Comments: states her son who has had 3 heart attacks and daughters in law live with her.    Prior Function Level of Independence: Independent         Comments: pt cooks and cleans, has falls, furniture walks at home     Hand Dominance   Dominant Hand: Right    Extremity/Trunk Assessment   Upper Extremity Assessment: Defer to OT evaluation           Lower Extremity Assessment: Generalized weakness      Cervical / Trunk Assessment: Kyphotic  Communication   Communication: HOH  Cognition Arousal/Alertness: Awake/alert Behavior During Therapy: Flat affect Overall Cognitive Status: History of cognitive impairments - at baseline                      General Comments      Exercises        Assessment/Plan    PT Assessment Patient needs continued PT services  PT Diagnosis Generalized weakness   PT Problem List Decreased activity tolerance;Decreased balance;Decreased mobility;Decreased strength;Decreased knowledge of use of DME;Decreased safety awareness;Decreased knowledge of precautions;Cardiopulmonary status limiting activity  PT Treatment Interventions DME instruction;Gait training;Therapeutic activities;Functional mobility training;Therapeutic exercise;Balance training;Patient/family education   PT Goals (Current goals can be found in the Care Plan section) Acute Rehab PT Goals Patient Stated Goal: to get better PT Goal Formulation: With patient Time For Goal Achievement: 08/18/15 Potential to Achieve Goals: Good    Frequency Min 3X/week   Barriers to discharge        Co-evaluation               End of Session Equipment Utilized During Treatment: Gait  belt;Oxygen Activity Tolerance: Patient limited by fatigue Patient left: in bed;with call bell/phone within reach;with bed alarm set Nurse Communication: Mobility status         Time: [id number] PT Time Calculation (min) (ACUTE ONLY): 28 min   Charges:   PT Evaluation $PT Eval Moderate Complexity: 1 Procedure PT Treatments $Gait Training: 8-22 mins   PT G CodesDenice Paradise 2015-08-19, 4:05 PM  [place] Razi Hickle,PT Acute Rehabilitation [phone] (pager)

## 2015-08-04 NOTE — Progress Notes (Signed)
No charge follow up note:  Meet with Patient and son in the room late this afternoon.  With her son's encouragement, the patient agrees to Hospice services at home.  Son and patient have known other people who used [facility] and they choose [facility].  Patient also filled out a MOST form - a copy of which is on the shadow chart and the original was given back to the patient.  Patient will go home on oxygen.  She did not have oxygen at home previously.  She will need 3L continuous.  Nebulizer treatments are also recommended.  The patient will need a nebulizer machine.  I placed an order with case management.  Imogene Burn, [place] [facility] Pager: [phone]

## 2015-08-05 LAB — CBC
HEMATOCRIT: 32.4 % — AB (ref 36.0–46.0)
HEMOGLOBIN: 9.6 g/dL — AB (ref 12.0–15.0)
MCH: 25.8 pg — ABNORMAL LOW (ref 26.0–34.0)
MCHC: 29.6 g/dL — ABNORMAL LOW (ref 30.0–36.0)
MCV: 87.1 fL (ref 78.0–100.0)
PLATELETS: 164 10*3/uL (ref 150–400)
RBC: 3.72 MIL/uL — AB (ref 3.87–5.11)
RDW: 16.5 % — AB (ref 11.5–15.5)
WBC: 3.4 10*3/uL — AB (ref 4.0–10.5)

## 2015-08-05 LAB — BASIC METABOLIC PANEL
Anion gap: 15 (ref 5–15)
BUN: 90 mg/dL — AB (ref 6–20)
CALCIUM: 6.6 mg/dL — AB (ref 8.9–10.3)
CO2: 19 mmol/L — ABNORMAL LOW (ref 22–32)
CREATININE: 6.1 mg/dL — AB (ref 0.44–1.00)
Chloride: 103 mmol/L (ref 101–111)
GFR calc non Af Amer: 6 mL/min — ABNORMAL LOW (ref >60)
GFR, EST AFRICAN AMERICAN: 7 mL/min — AB (ref >60)
Glucose, Bld: 95 mg/dL (ref 65–99)
Potassium: 3.9 mmol/L (ref 3.5–5.1)
SODIUM: 137 mmol/L (ref 135–145)

## 2015-08-05 MED ORDER — OXYCODONE HCL 5 MG PO TABS: 2.5000 mg | ORAL_TABLET | Freq: Two times a day (BID) | ORAL | Status: DC | PRN

## 2015-08-05 NOTE — Care Management Note (Signed)
Case Management Note  Patient Details  Name: [name] MRN: [id number] Date of Birth: January 25, 1935  Subjective/Objective:                   NSTEMI (non-ST elevated myocardial infarction) [facility]) Action/Plan: Discharge planning Expected Discharge Date:  08/06/15               Expected Discharge Plan:  Home w Hospice Care  In-House Referral:  Hospice / Palliative Care  Discharge planning Services  CM Consult  Post Acute Care Choice:    Choice offered to:  Patient, Adult Children  DME Arranged:  Oxygen, Nebulizer/meds DME Agency:     HH Arranged:    [place] Agency:  Other - See comment  Status of Service:  Completed, signed off  Medicare Important Message Given:    Date Medicare IM Given:    Medicare IM give by:    Date Additional Medicare IM Given:    Additional Medicare Important Message give by:     If discussed at [place] of Stay Meetings, dates discussed:    Additional Comments: CM received request to arrange for home hospice with [facility]. Contact is son of pt, [name], [phone]. CM spoke with [facility] rep, Bambi who requested I fax facesheet, order/consult, H&P, Palliative consult to [phone].  CM faxed requested information.  No other CM needs were communicated. Dellie Catholic, RN 08/05/2015, 12:13 PM

## 2015-08-05 NOTE — Progress Notes (Signed)
Subjective: No chest pain, stated her breathing was [facility]  Unfortunately, her creatinine is steadiy increasing . Received a dose of lasix yesterda.    Objective: Vital signs in last 24 hours: Temp:  [97.6 F (36.4 C)-97.9 F (36.6 C)] 97.9 F (36.6 C) (02/18 0457) Pulse Rate:  [53-57] 56 (02/18 0457) Resp:  [20] 20 (02/18 0457) BP: (102-130)/(42-51) 130/51 mmHg (02/18 0457) SpO2:  [91 %-95 %] 95 % (02/18 0457) Weight:  [168 lb 14.4 oz (76.613 kg)] 168 lb 14.4 oz (76.613 kg) (02/18 0457) Weight change: -3.2 oz (-0.091 kg) Last BM Date: 08/03/15 Intake/Output from previous day: -382 02/17 0701 - 02/18 0700 In: 360 [P.O.:360] Out: 300 [Urine:300] Intake/Output this shift:    PE: General:Pleasant affect, NAD Skin:Warm and dry, brisk capillary refill HEENT:normocephalic, sclera clear, mucus membranes moist, hard of hearing Heart:S1S2 RRR without murmur, gallup, rub or click Lungs:diminished without rales, rhonchi, or wheezes [id number], non tender, + BS, do not palpate liver spleen or masses Ext:no lower ext edema, 2+ pedal pulses, 2+ radial pulses Neuro:alert and oriented X 3, MAE, follows commands, + facial symmetry Tele:  SR to SB rate to 39 at times.   Lab Results:  Recent Labs  08/04/15 0420 08/05/15 0538  WBC 3.4* 3.4*  HGB 9.8* 9.6*  HCT 31.4* 32.4*  PLT 163 164   BMET  Recent Labs  08/04/15 0420 08/05/15 0538  NA 137 137  K 3.9 3.9  CL 101 103  CO2 20* 19*  GLUCOSE 93 95  BUN 77* 90*  CREATININE 5.57* 6.10*  CALCIUM 7.0* 6.6*   No results for input(s): TROPONINI in the last 72 hours.  Invalid input(s): CK, MB  Lab Results  Component Value Date   CHOL 115 08/27/2012   HDL 54 08/27/2012   LDLCALC 36 08/27/2012   TRIG 126 08/27/2012   CHOLHDL 2.1 08/27/2012   Lab Results  Component Value Date   HGBA1C 5.5 08/02/2015     Lab Results  Component Value Date   TSH 1.420 02/20/2014       Studies/Results: ECHO: Study  Conclusions  - Left ventricle: The cavity size was normal. There was moderate concentric hypertrophy. Systolic function was moderately to severely reduced. The estimated ejection fraction was in the range of 30% to 35%. Diffuse hypokinesis. Doppler parameters are consistent with abnormal left ventricular relaxation (grade 1 diastolic dysfunction). Doppler parameters are consistent with elevated ventricular end-diastolic filling pressure. - Ventricular septum: Septal motion showed abnormal function, dyssynergy, and paradox. - Aortic valve: Trileaflet; normal thickness leaflets. There was no regurgitation. - Aortic root: The aortic root was normal in size. - Mitral valve: Calcified annulus. There was no regurgitation. - Left atrium: The atrium was mildly dilated. - Right ventricle: The cavity size was normal. Wall thickness was normal. Systolic function was mildly reduced. - Tricuspid valve: There was no regurgitation. - Pulmonary arteries: Systolic pressure was within the normal range. - Inferior vena cava: The vessel was normal in size. - Pericardium, extracardiac: There was no pericardial effusion.   Medications: I have reviewed the patient's current medications. Scheduled Meds: . amLODipine  5 mg Oral Daily  . aspirin EC  81 mg Oral Daily  . atorvastatin  20 mg Oral q1800  . azithromycin  250 mg Oral Daily  . carvedilol  25 mg Oral BID WC  . cefTRIAXone (ROCEPHIN)  IV  1 g Intravenous Q24H  . ipratropium-albuterol  3 mL Nebulization BID  . isosorbide  mononitrate  60 mg Oral Daily  . oseltamivir  30 mg Oral Daily  . pneumococcal 23 valent vaccine  0.5 mL Intramuscular Tomorrow-1000  . 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 female with CAD, MI first in 2001, CKD stage 4, CVA (ischemic in 2008, hemorrhagic in 2012), HTN and ischemic cardiomyopathy (LVEF 25-30%), DNR and poor compliance with the medications who presented with chest pain. Patient is admitted under triad hospitalist service and is being managed for HTN urgency, community acquired pneumonia and NSTEMI.   1. NSTEMI vs trop elevation 2/2 to hypertensive urgency - trop peaked at 0.84.  Has refused cath as she does not want to be on dialysis.  No further recs.  - Palliative consult done- .treat SOB with morphine -pt is DNR   2. Acute systolic congestive heart failure, NYHA class 4 exacerbated by HTN emergency, medication non compliance - BNP > 4000. Diuresing well - Last echo 9/15 showed ef of 25-30%, Repeat ECHO showed EF 30-35%.  - Address medication compliance and importance, seems not interested. Without medication compliance hard to tell if EF would have improved. Now dosse meet criteria for ICD placement. Would discuss further with attending.  - Not on ACE/ARB due to CKD.   3. Hypertensive urgency - now normotensive after home meds.  - compliance with meds would be important.   4. CKD, stage 5 Creatinine has continued to climb.  Agree with Dr. Tana Coast that she should be observed 1 more day    5. CAP (community acquired pneumonia) with Influenza - per primary  6. Hyperglycemia - a1c is 5.5. Likely high in the setting of stress.   LOS: 4 days   Will sign off.  Call for  questions     Finlee Concepcion, Wonda Cheng, MD  08/05/2015 10:09 AM    [address],  [place], [place]  [id number] Pager [phone] Phone: [phone]; Fax: [phone]   [facility]  [address] [place],   [phone]   Fax [phone]

## 2015-08-05 NOTE — Progress Notes (Signed)
[facility]                                                                              Patient Demographics  [name], is a 81 y.o. female, DOB - 1934/12/13, [id number]  Admit date - 08/01/2015   Admitting Physician Etta Quill, DO  Outpatient Primary MD for the patient is Lynne Logan, MD  LOS - 4   Chief Complaint  Patient presents with  . Respiratory Distress       Brief HPI   [name] is a 81 y.o. female with h/o CAD, MI, CKD stage 4, CVA (ischemic in 2008, hemorrhagic in 2012), HTN presented to ED with shortness of breath, chest pain and increasing dyspnea, worsening over the last 2 days. No fevers or chills. The patient had been taking a lot of NTG at home for chest pain (5 SL NTG), currently pain free but does get pain with minimal activity (walking to bathroom). Breathing also improved modestly with neb treatments in ED. ED workup showed positive troponin of 0.55, EKG is ischemic, NSTEMI. Cardiology was consulted and patient was placed on heparin drip and admitted to stepdown unit.   Assessment & Plan    Principal Problem:   NSTEMI (non-ST elevated myocardial infarction) [facility]) - Cardiology consulted, no chest pain currently, Patient was placed on IV heparin for 48 hours - continue aspirin, Lipitor, Coreg, Imdur, Ranexa - Patient refused cardiac cath, due to risk of hemodialysis with stage IV CKD. Hence cardiology recommended medical management at this time. -  palliative medicine consulted, appreciate recommendations  Active Problems:   Acute systolic congestive heart failure, NYHA class 4 (HCC) likely exacerbated by hypertensive emergency, noncompliance, pneumonia - BNP> 4000 at the time of admission, continue IV Lasix - Negative balance of 2.8 L, weight down from 170-> 168lbs  - Not on ACEI/ARB due to chronic kidney disease - Creatinine continues to trend up, Lasix was discontinued yesterday however patient had received  morning dose of IV Lasix 40 mg. Creatinine 6.1 today. Continue to hold Lasix.  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 Continue to hold Lasix  Hyperglycemia with no underlying history of diabetes -  hemoglobin A1c5.5  Diarrhea - Resolved, no BM for over 24 hours, discontinued C. difficile order   Code Status: DO NOT RESUSCITATE   Family Communication: Discussed in detail with the patient, all imaging results, lab results explained to the patient    Disposition Plan:  Hopefully DC home with hospice if  creatinine improving tomorrow  Time Spent in minutes  43minutes  Procedures  None   Consults   Cardiology Palliative medicine  DVT Prophylaxis   heparin   Medications  Scheduled Meds: . amLODipine  5 mg Oral Daily  . aspirin EC  81 mg Oral Daily  . atorvastatin  20 mg Oral q1800  . azithromycin  250 mg Oral Daily  . carvedilol  25 mg Oral BID WC  . cefTRIAXone (ROCEPHIN)  IV  1 g Intravenous Q24H  . ipratropium-albuterol  3 mL Nebulization BID  . isosorbide mononitrate  60 mg Oral Daily  . oseltamivir  30 mg Oral Daily  . pneumococcal 23 valent vaccine  0.5 mL Intramuscular Tomorrow-1000  . ranolazine  500 mg Oral BID   Continuous Infusions:   PRN Meds:.acetaminophen, albuterol, morphine injection, nitroGLYCERIN, ondansetron (ZOFRAN)  IV   Antibiotics   Anti-infectives    Start     Dose/Rate Route Frequency Ordered Stop   08/03/15 0800  oseltamivir (TAMIFLU) capsule 30 mg    Comments:  Tamiflu 30 mg once daily for CrCl < 30 mL/min   30 mg Oral Daily 08/03/15 0733 08/08/15 0959   08/03/15 0200  oseltamivir (TAMIFLU) capsule 30 mg     30 mg Oral  Once 08/03/15 0154 08/03/15 0518   08/02/15 2200  cefTRIAXone (ROCEPHIN) 1 g in dextrose 5 % 50 mL IVPB     1 g 100 mL/hr over 30 Minutes Intravenous Every 24 hours 08/01/15 2313     08/02/15 1000  azithromycin (ZITHROMAX) tablet 250 mg     250 mg Oral Daily 08/01/15 2313     08/01/15 2000  azithromycin (ZITHROMAX) 500 mg in dextrose 5 % 250 mL IVPB     500 mg 250 mL/hr over 60 Minutes Intravenous  Once 08/01/15 1949 08/01/15 2139   08/01/15 2000  cefTRIAXone (ROCEPHIN) 1 g in dextrose 5 % 50 mL IVPB     1 g 100 mL/hr over 30 Minutes Intravenous  Once 08/01/15 1949 08/01/15 2036        Subjective:   [name] was seen and examined today. No complaints. No worsening of shortness of breath. No nausea, vomiting or diarrhea. Patient denies dizziness, chest pain, abdominal pain.  Denies fevers or chills.    Objective:   Filed Vitals:   08/04/15 1211 08/04/15 2042 08/05/15 0457 08/05/15 1005  BP: 102/42 125/50 130/51 135/48  Pulse: 53 57 56 58  Temp: 97.6 F (36.4 C) 97.9 F (36.6 C) 97.9 F (36.6 C)   TempSrc: Oral Oral Oral   Resp: 20 20 20 20   Height:      Weight:   76.613 kg (168 lb 14.4 oz)   SpO2: 94% 91% 95% 93%    Intake/Output Summary (Last 24 hours) at 08/05/15 1058 Last data filed at 08/05/15 0900  Gross per 24 hour  Intake    120 ml  Output    300 ml  Net   -180 ml     Wt Readings from Last 3 Encounters:  08/05/15 76.613 kg (168 lb 14.4 oz)  03/17/14 83.915 kg (185 lb)  02/24/14 86.546 kg (190 lb 12.8 oz)     Exam  General: Alert and oriented x 3, NAD  HEENT:    Neck: Supple, + JVD  CVS: S1 S2 auscultated,Regular rate  and rhythm.  Respiratory: Diminished breath sounds at the bases  Abdomen: Soft, nontender, nondistended, + bowel sounds  Ext: no cyanosis clubbing , no edema  Neuro: no new deficits   Skin: No rashes  Psych: Normal affect and demeanor, alert and oriented x3    Data Review   Micro Results Recent Results (from the past 240 hour(s))  MRSA PCR Screening     Status: None   Collection Time: 08/02/15  6:47 AM  Result Value Ref Range Status   MRSA by PCR NEGATIVE NEGATIVE Final    Comment:        The GeneXpert MRSA Assay (FDA approved for NASAL specimens only), is one component of a comprehensive MRSA colonization surveillance program. It is not intended to diagnose MRSA infection nor to guide or monitor treatment for MRSA infections.   Culture, blood (routine x 2)     Status: None (Preliminary result)   Collection Time: 08/02/15  1:25 PM  Result Value Ref Range Status   Specimen Description BLOOD RIGHT ARM  Final   Special Requests BOTTLES DRAWN AEROBIC AND ANAEROBIC [place]  Final   Culture NO GROWTH 2 DAYS  Final   Report Status PENDING  Incomplete  Culture, blood (routine x 2)     Status: None (Preliminary result)   Collection Time: 08/02/15  1:34 PM  Result Value Ref Range Status   Specimen Description BLOOD RIGHT HAND  Final   Special Requests BOTTLES DRAWN AEROBIC AND ANAEROBIC 5CC  Final   Culture NO GROWTH 2 DAYS  Final   Report Status PENDING  Incomplete    Radiology Reports Dg Chest Port 1 View  08/01/2015  CLINICAL DATA:  Short of breath EXAM: PORTABLE CHEST 1 VIEW COMPARISON:  02/20/2014 FINDINGS: The heart is moderately enlarged. Aorta remains tortuous. A focal opacity in the right upper lung zone has developed. Pulmonary vascularity is within normal limits. No pneumothorax or pleural effusion. IMPRESSION: New focal right upper lobe opacity has developed. Followup PA and lateral chest X-ray is recommended in 3-4 weeks following trial of antibiotic therapy to  ensure resolution and exclude underlying malignancy. Electronically Signed   By: Marybelle Killings M.D.   On: 08/01/2015 19:33    CBC  Recent Labs Lab 08/01/15 1922 08/02/15 0334 08/03/15 0300 08/04/15 0420 08/05/15 0538  WBC 7.0 6.9 5.4 3.4* 3.4*  HGB 12.1 11.6* 10.2* 9.8* 9.6*  HCT 40.2 38.6 33.8* 31.4* 32.4*  PLT 189 186 181 163 164  MCV 88.0 88.3 87.3 87.7 87.1  MCH 26.5 26.5 26.4 27.4 25.8*  MCHC 30.1 30.1 30.2 31.2 29.6*  RDW 16.5* 16.4* 16.7* 16.8* 16.5*  LYMPHSABS 0.9  --   --   --   --   MONOABS 0.6  --   --   --   --   EOSABS 0.0  --   --   --   --   BASOSABS 0.0  --   --   --   --     Chemistries   Recent Labs Lab 08/01/15 1922 08/02/15 0334 08/03/15 0300 08/04/15 0420 08/05/15 0538  NA 139 138 138 137 137  K 3.7 4.0 3.9 3.9 3.9  CL 104 103 103 101 103  CO2 20* 21* 23 20* 19*  GLUCOSE 128* 103* 115* 93 95  BUN 32* 34* 58* 77* 90*  CREATININE 2.84* 3.06* 4.31* 5.57* 6.10*  CALCIUM 8.4* 8.0* 7.4* 7.0* 6.6*   ------------------------------------------------------------------------------------------------------------------ estimated creatinine clearance is 7.8 mL/min (by C-G formula based on Cr of 6.1). ------------------------------------------------------------------------------------------------------------------ No results for input(s): HGBA1C in the last 72 hours. ------------------------------------------------------------------------------------------------------------------ No results for input(s): CHOL, HDL, LDLCALC, TRIG, CHOLHDL, LDLDIRECT in  the last 72 hours. ------------------------------------------------------------------------------------------------------------------ No results for input(s): TSH, T4TOTAL, T3FREE, THYROIDAB in the last 72 hours.  Invalid input(s): FREET3 ------------------------------------------------------------------------------------------------------------------ No results for input(s): VITAMINB12, FOLATE, FERRITIN, TIBC,  IRON, RETICCTPCT in the last 72 hours.  Coagulation profile No results for input(s): INR, PROTIME in the last 168 hours.  No results for input(s): DDIMER in the last 72 hours.  Cardiac Enzymes  Recent Labs Lab 08/01/15 2328 08/02/15 0334 08/02/15 0948  TROPONINI 0.73* 0.81* 0.84*   ------------------------------------------------------------------------------------------------------------------ Invalid input(s): POCBNP  No results for input(s): GLUCAP in the last 72 hours.   RAI,RIPUDEEP M.D. [facility] 08/05/2015, 10:58 AM  Pager: [phone] Between 7am to 7pm - call Pager - [phone]  After 7pm go to [email or web address] - password [email or web address]  Call night coverage person covering after 7pm

## 2015-08-06 DIAGNOSIS — I5043 Acute on chronic combined systolic (congestive) and diastolic (congestive) heart failure: Secondary | ICD-10-CM

## 2015-08-06 LAB — BASIC METABOLIC PANEL
ANION GAP: 14 (ref 5–15)
Anion gap: 13 (ref 5–15)
BUN: 97 mg/dL — AB (ref 6–20)
BUN: 95 mg/dL — AB (ref 6–20)
CALCIUM: 6.4 mg/dL — AB (ref 8.9–10.3)
CO2: 20 mmol/L — ABNORMAL LOW (ref 22–32)
CO2: 21 mmol/L — AB (ref 22–32)
Calcium: 6.5 mg/dL — ABNORMAL LOW (ref 8.9–10.3)
Chloride: 103 mmol/L (ref 101–111)
Chloride: 102 mmol/L (ref 101–111)
Creatinine, Ser: 6.22 mg/dL — ABNORMAL HIGH (ref 0.44–1.00)
Creatinine, Ser: 5.85 mg/dL — ABNORMAL HIGH (ref 0.44–1.00)
GFR calc Af Amer: 7 mL/min — ABNORMAL LOW (ref >60)
GFR, EST AFRICAN AMERICAN: 7 mL/min — AB (ref >60)
GFR, EST NON AFRICAN AMERICAN: 6 mL/min — AB (ref >60)
GFR, EST NON AFRICAN AMERICAN: 6 mL/min — AB (ref >60)
GLUCOSE: 126 mg/dL — AB (ref 65–99)
Glucose, Bld: 109 mg/dL — ABNORMAL HIGH (ref 65–99)
POTASSIUM: 4.1 mmol/L (ref 3.5–5.1)
POTASSIUM: 4.4 mmol/L (ref 3.5–5.1)
SODIUM: 137 mmol/L (ref 135–145)
Sodium: 136 mmol/L (ref 135–145)

## 2015-08-06 LAB — STREP PNEUMONIAE URINARY ANTIGEN: Strep Pneumo Urinary Antigen: NEGATIVE

## 2015-08-06 LAB — CBC
HCT: 31.1 % — ABNORMAL LOW (ref 36.0–46.0)
Hemoglobin: 9.3 g/dL — ABNORMAL LOW (ref 12.0–15.0)
MCH: 25.8 pg — ABNORMAL LOW (ref 26.0–34.0)
MCHC: 29.9 g/dL — ABNORMAL LOW (ref 30.0–36.0)
MCV: 86.4 fL (ref 78.0–100.0)
PLATELETS: 151 10*3/uL (ref 150–400)
RBC: 3.6 MIL/uL — AB (ref 3.87–5.11)
RDW: 16.2 % — ABNORMAL HIGH (ref 11.5–15.5)
WBC: 3.6 10*3/uL — AB (ref 4.0–10.5)

## 2015-08-06 LAB — ALBUMIN: Albumin: 2.8 g/dL — ABNORMAL LOW (ref 3.5–5.0)

## 2015-08-06 MED ORDER — SODIUM CHLORIDE 0.9 % IV SOLN
1.0000 g | Freq: Once | INTRAVENOUS | Status: AC
  Administered 2015-08-06: 1 g via INTRAVENOUS
  Filled 2015-08-06: qty 10

## 2015-08-06 MED ORDER — SODIUM CHLORIDE 0.9 % IV BOLUS (SEPSIS)
500.0000 mL | Freq: Once | INTRAVENOUS | Status: AC
  Administered 2015-08-06: 500 mL via INTRAVENOUS

## 2015-08-06 NOTE — Progress Notes (Signed)
[facility]                                                                              Patient Demographics  [name], is a 81 y.o. female, DOB - November 25, 1934, [id number]  Admit date - 08/01/2015   Admitting Physician Etta Quill, DO  Outpatient Primary MD for the patient is Lynne Logan, MD  LOS - 5   Chief Complaint  Patient presents with  . Respiratory Distress       Brief HPI   [name] is a 81 y.o. female with h/o CAD, MI, CKD stage 4, CVA (ischemic in 2008, hemorrhagic in 2012), HTN presented to ED with shortness of breath, chest pain and increasing dyspnea, worsening over the last 2 days. No fevers or chills. The patient had been taking a lot of NTG at home for chest pain (5 SL NTG), currently pain free but does get pain with minimal activity (walking to bathroom). Breathing also improved modestly with neb treatments in ED. ED workup showed positive troponin of 0.55, EKG is ischemic, NSTEMI. Cardiology was consulted and patient was placed on heparin drip and admitted to stepdown unit.   Assessment & Plan    Principal Problem:   NSTEMI (non-ST elevated myocardial infarction) [facility]) - Cardiology consulted, no chest pain currently, Patient was placed on IV heparin for 48 hours - continue aspirin, Lipitor, Coreg, Imdur, Ranexa - Patient refused cardiac cath, due to risk of hemodialysis with stage IV CKD. Hence cardiology recommended medical management at this time. -  palliative medicine consulted, appreciate recommendations, plan to DC home with hospice  Active Problems:   Acute systolic congestive heart failure, NYHA class 4 (HCC) likely exacerbated by hypertensive emergency, noncompliance, pneumonia - BNP> 4000 at the time of admission, continue IV Lasix - Negative balance of 2.8 L, weight down from 170-> 168lbs  - Not on ACEI/ARB due to chronic kidney disease - Creatinine continues to trend up, Lasix was discontinued 2/17,  creatinine continues to trend up. Discussed with cardiology, will give small amounts of IV fluids. BMET later today.   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, NYHA class 4 and CKD stage 4 (HCC) - BP 210/97 at the time of admission - Continue amlodipine, Imdur, Coreg     CAP (community acquired pneumonia) - Continue IV Zithromax, Rocephin, DuoNeb - Chest x-ray showed new focal right upper lobe opacity, follow-up chest x-ray in 3-4 weeks - Blood cultures negative to date  - urine legionella antigen, urine strep antigen still pending    acute onCK D stage IV: Likely worsened due to diuresis Baseline creatinine ~3, - Creatinine still trending up, 6.2, continue to hold Lasix, 500 mL IV fluids today  Hyperglycemia with no underlying history of diabetes -  hemoglobin A1c5.5  Diarrhea - Resolved   Code Status: DO NOT RESUSCITATE   Family Communication: Discussed in detail with the patient, all imaging results, lab results explained to the patient    Disposition Plan:  Hopefully DC home with hospice  if creatinine improving tomorrow  Time Spent in minutes  62minutes  Procedures  None   Consults   Cardiology Palliative medicine  DVT Prophylaxis   heparin   Medications  Scheduled Meds: . amLODipine  5 mg Oral Daily  . aspirin EC  81 mg Oral Daily  . atorvastatin  20 mg Oral q1800  . azithromycin  250 mg Oral Daily  . carvedilol  25 mg Oral BID WC  . cefTRIAXone (ROCEPHIN)  IV  1 g Intravenous Q24H  . ipratropium-albuterol  3 mL Nebulization BID  . isosorbide mononitrate  60 mg Oral Daily  . oseltamivir  30 mg Oral Daily  . pneumococcal 23 valent vaccine  0.5 mL Intramuscular Tomorrow-1000  . ranolazine  500 mg Oral BID  . sodium chloride  500 mL Intravenous Once   Continuous Infusions:   PRN Meds:.acetaminophen, albuterol, morphine injection,  nitroGLYCERIN, ondansetron (ZOFRAN) IV   Antibiotics   Anti-infectives    Start     Dose/Rate Route Frequency Ordered Stop   08/03/15 0800  oseltamivir (TAMIFLU) capsule 30 mg    Comments:  Tamiflu 30 mg once daily for CrCl < 30 mL/min   30 mg Oral Daily 08/03/15 0733 08/08/15 0959   08/03/15 0200  oseltamivir (TAMIFLU) capsule 30 mg     30 mg Oral  Once 08/03/15 0154 08/03/15 0518   08/02/15 2200  cefTRIAXone (ROCEPHIN) 1 g in dextrose 5 % 50 mL IVPB     1 g 100 mL/hr over 30 Minutes Intravenous Every 24 hours 08/01/15 2313     08/02/15 1000  azithromycin (ZITHROMAX) tablet 250 mg     250 mg Oral Daily 08/01/15 2313     08/01/15 2000  azithromycin (ZITHROMAX) 500 mg in dextrose 5 % 250 mL IVPB     500 mg 250 mL/hr over 60 Minutes Intravenous  Once 08/01/15 1949 08/01/15 2139   08/01/15 2000  cefTRIAXone (ROCEPHIN) 1 g in dextrose 5 % 50 mL IVPB     1 g 100 mL/hr over 30 Minutes Intravenous  Once 08/01/15 1949 08/01/15 2036        Subjective:   [name] was seen and examined today. No complaints per patient. Denies any chest pain or shortness of breath, fevers or chills.  No nausea, vomiting or diarrhea. Patient denies dizziness, chest pain, abdominal pain.    Objective:   Filed Vitals:   08/05/15 1300 08/05/15 2027 08/06/15 0006 08/06/15 0412  BP: 126/60 135/56 124/52 128/49  Pulse: 53 49 49 46  Temp: 97.8 F (36.6 C) 97.6 F (36.4 C) 97.4 F (36.3 C) 97.5 F (36.4 C)  TempSrc: Oral Oral Oral Oral  Resp: 20 18 20 18   Height:      Weight:    77.52 kg (170 lb 14.4 oz)  SpO2: 90% 93% 95% 94%    Intake/Output Summary (Last 24 hours) at 08/06/15 1045 Last data filed at 08/06/15 1000  Gross per 24 hour  Intake    450 ml  Output   1150 ml  Net   -700 ml     Wt Readings from Last 3 Encounters:  08/06/15 77.52 kg (170 lb 14.4 oz)  03/17/14 83.915 kg (185 lb)  02/24/14 86.546 kg (190 lb 12.8 oz)     Exam  General: Alert and oriented x 3,  NAD  HEENT:    Neck: Supple, no JVD  CVS: S1 S2 auscultated,Regular rate and rhythm.  Respiratory: Diminished breath sounds at the bases, no rales  or rhonchi  Abdomen: Soft, nontender, nondistended, + bowel sounds  Ext: no cyanosis clubbing , no edema  Neuro: no new deficits   Skin: No rashes  Psych: Normal affect and demeanor, alert and oriented x3    Data Review   Micro Results Recent Results (from the past 240 hour(s))  MRSA PCR Screening     Status: None   Collection Time: 08/02/15  6:47 AM  Result Value Ref Range Status   MRSA by PCR NEGATIVE NEGATIVE Final    Comment:        The GeneXpert MRSA Assay (FDA approved for NASAL specimens only), is one component of a comprehensive MRSA colonization surveillance program. It is not intended to diagnose MRSA infection nor to guide or monitor treatment for MRSA infections.   Culture, blood (routine x 2)     Status: None (Preliminary result)   Collection Time: 08/02/15  1:25 PM  Result Value Ref Range Status   Specimen Description BLOOD RIGHT ARM  Final   Special Requests BOTTLES DRAWN AEROBIC AND ANAEROBIC [place]  Final   Culture NO GROWTH 3 DAYS  Final   Report Status PENDING  Incomplete  Culture, blood (routine x 2)     Status: None (Preliminary result)   Collection Time: 08/02/15  1:34 PM  Result Value Ref Range Status   Specimen Description BLOOD RIGHT HAND  Final   Special Requests BOTTLES DRAWN AEROBIC AND ANAEROBIC 5CC  Final   Culture NO GROWTH 3 DAYS  Final   Report Status PENDING  Incomplete    Radiology Reports Dg Chest Port 1 View  08/01/2015  CLINICAL DATA:  Short of breath EXAM: PORTABLE CHEST 1 VIEW COMPARISON:  02/20/2014 FINDINGS: The heart is moderately enlarged. Aorta remains tortuous. A focal opacity in the right upper lung zone has developed. Pulmonary vascularity is within normal limits. No pneumothorax or pleural effusion. IMPRESSION: New focal right upper lobe opacity has developed. Followup  PA and lateral chest X-ray is recommended in 3-4 weeks following trial of antibiotic therapy to ensure resolution and exclude underlying malignancy. Electronically Signed   By: Marybelle Killings M.D.   On: 08/01/2015 19:33    CBC  Recent Labs Lab 08/01/15 1922 08/02/15 0334 08/03/15 0300 08/04/15 0420 08/05/15 0538 08/06/15 0329  WBC 7.0 6.9 5.4 3.4* 3.4* 3.6*  HGB 12.1 11.6* 10.2* 9.8* 9.6* 9.3*  HCT 40.2 38.6 33.8* 31.4* 32.4* 31.1*  PLT 189 186 181 163 164 151  MCV 88.0 88.3 87.3 87.7 87.1 86.4  MCH 26.5 26.5 26.4 27.4 25.8* 25.8*  MCHC 30.1 30.1 30.2 31.2 29.6* 29.9*  RDW 16.5* 16.4* 16.7* 16.8* 16.5* 16.2*  LYMPHSABS 0.9  --   --   --   --   --   MONOABS 0.6  --   --   --   --   --   EOSABS 0.0  --   --   --   --   --   BASOSABS 0.0  --   --   --   --   --     Chemistries   Recent Labs Lab 08/02/15 0334 08/03/15 0300 08/04/15 0420 08/05/15 0538 08/06/15 0652  NA 138 138 137 137 137  K 4.0 3.9 3.9 3.9 4.1  CL 103 103 101 103 103  CO2 21* 23 20* 19* 20*  GLUCOSE 103* 115* 93 95 109*  BUN 34* 58* 77* 90* 97*  CREATININE 3.06* 4.31* 5.57* 6.10* 6.22*  CALCIUM 8.0* 7.4* 7.0* 6.6*  6.5*   ------------------------------------------------------------------------------------------------------------------ estimated creatinine clearance is 7.7 mL/min (by C-G formula based on Cr of 6.22). ------------------------------------------------------------------------------------------------------------------ No results for input(s): HGBA1C in the last 72 hours. ------------------------------------------------------------------------------------------------------------------ No results for input(s): CHOL, HDL, LDLCALC, TRIG, CHOLHDL, LDLDIRECT in the last 72 hours. ------------------------------------------------------------------------------------------------------------------ No results for input(s): TSH, T4TOTAL, T3FREE, THYROIDAB in the last 72 hours.  Invalid input(s):  FREET3 ------------------------------------------------------------------------------------------------------------------ No results for input(s): VITAMINB12, FOLATE, FERRITIN, TIBC, IRON, RETICCTPCT in the last 72 hours.  Coagulation profile No results for input(s): INR, PROTIME in the last 168 hours.  No results for input(s): DDIMER in the last 72 hours.  Cardiac Enzymes  Recent Labs Lab 08/01/15 2328 08/02/15 0334 08/02/15 0948  TROPONINI 0.73* 0.81* 0.84*   ------------------------------------------------------------------------------------------------------------------ Invalid input(s): POCBNP  No results for input(s): GLUCAP in the last 72 hours.   Halford Goetzke M.D. [facility] 08/06/2015, 10:45 AM  Pager: [phone] Between 7am to 7pm - call Pager - [phone]  After 7pm go to [email or web address] - password [email or web address]  Call night coverage person covering after 7pm

## 2015-08-06 NOTE — Progress Notes (Signed)
Subjective: No chest pain, stated her breathing was [facility]  Unfortunately, her creatinine is steadiy increasing . Received a dose of lasix 2 days ago  Is still diuresing  Appears to be dry today     Objective: Vital signs in last 24 hours: Temp:  [97.4 F (36.3 C)-97.8 F (36.6 C)] 97.5 F (36.4 C) (02/19 0412) Pulse Rate:  [46-58] 46 (02/19 0412) Resp:  [18-20] 18 (02/19 0412) BP: (124-135)/(48-60) 128/49 mmHg (02/19 0412) SpO2:  [90 %-95 %] 94 % (02/19 0412) Weight:  [170 lb 14.4 oz (77.52 kg)] 170 lb 14.4 oz (77.52 kg) (02/19 0412) Weight change: 2 lb (0.907 kg) Last BM Date: 08/03/15 Intake/Output from previous day: -382 02/18 0701 - 02/19 0700 In: 330 [P.O.:280; IV Piggyback:50] Out: 1150 [Urine:1150] Intake/Output this shift:    PE: General:Pleasant affect, NAD Skin:Warm and dry,   HEENT:normocephalic, sclera clear, mucus membranes moist, hard of hearing Heart:S1S2 RRR without murmur, gallup, rub or click Lungs:diminished without rales, rhonchi, or wheezes [id number], non tender, + BS, do not palpate liver spleen or masses Ext: decreased skin turgur Neuro: slow to respond   symmetry Tele:      Lab Results:  Recent Labs  08/05/15 0538 08/06/15 0329  WBC 3.4* 3.6*  HGB 9.6* 9.3*  HCT 32.4* 31.1*  PLT 164 151   BMET  Recent Labs  08/05/15 0538 08/06/15 0652  NA 137 137  K 3.9 4.1  CL 103 103  CO2 19* 20*  GLUCOSE 95 109*  BUN 90* 97*  CREATININE 6.10* 6.22*  CALCIUM 6.6* 6.5*   No results for input(s): TROPONINI in the last 72 hours.  Invalid input(s): CK, MB  Lab Results  Component Value Date   CHOL 115 08/27/2012   HDL 54 08/27/2012   LDLCALC 36 08/27/2012   TRIG 126 08/27/2012   CHOLHDL 2.1 08/27/2012   Lab Results  Component Value Date   HGBA1C 5.5 08/02/2015     Lab Results  Component Value Date   TSH 1.420 02/20/2014       Studies/Results: ECHO: Study Conclusions  - Left ventricle: The cavity size was  normal. There was moderate concentric hypertrophy. Systolic function was moderately to severely reduced. The estimated ejection fraction was in the range of 30% to 35%. Diffuse hypokinesis. Doppler parameters are consistent with abnormal left ventricular relaxation (grade 1 diastolic dysfunction). Doppler parameters are consistent with elevated ventricular end-diastolic filling pressure. - Ventricular septum: Septal motion showed abnormal function, dyssynergy, and paradox. - Aortic valve: Trileaflet; normal thickness leaflets. There was no regurgitation. - Aortic root: The aortic root was normal in size. - Mitral valve: Calcified annulus. There was no regurgitation. - Left atrium: The atrium was mildly dilated. - Right ventricle: The cavity size was normal. Wall thickness was normal. Systolic function was mildly reduced. - Tricuspid valve: There was no regurgitation. - Pulmonary arteries: Systolic pressure was within the normal range. - Inferior vena cava: The vessel was normal in size. - Pericardium, extracardiac: There was no pericardial effusion.   Medications: I have reviewed the patient's current medications. Scheduled Meds: . amLODipine  5 mg Oral Daily  . aspirin EC  81 mg Oral Daily  . atorvastatin  20 mg Oral q1800  . azithromycin  250 mg Oral Daily  . carvedilol  25 mg Oral BID WC  . cefTRIAXone (ROCEPHIN)  IV  1 g Intravenous Q24H  . ipratropium-albuterol  3 mL Nebulization BID  . isosorbide mononitrate  60 mg Oral Daily  . oseltamivir  30 mg Oral Daily  . pneumococcal 23 valent vaccine  0.5 mL Intramuscular Tomorrow-1000  . 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 female with CAD, MI first in 2001, CKD stage 4, CVA (ischemic in 2008, hemorrhagic in 2012), HTN and ischemic cardiomyopathy (LVEF 25-30%), DNR and poor compliance with the medications who presented with chest pain. Patient is admitted under [facility] hospitalist service and is being managed for HTN urgency, community acquired pneumonia and NSTEMI.   1. NSTEMI vs trop elevation 2/2 to hypertensive urgency - trop peaked at 0.84.  Has refused cath as she does not want to be on dialysis.  No further recs.  - Palliative consult done- .treat SOB with morphine -pt is DNR  2. Acute systolic congestive heart failure, NYHA class 4 exacerbated by HTN emergency, medication non compliance . - Last echo 9/15 showed ef of 25-30%, Repeat ECHO showed EF 30-35%.  She appears volume depleted today  Would likely benefit from gentle hydration .   3. Hypertensive urgency - now normotensive after home meds.  - compliance with meds would be important.   4. CKD, stage 5 Creatinine has continued to climb. Will likely improve after gentle hydration Suggest 500 cc NS over 5 hours and recheck bmp   5. CAP (community acquired pneumonia) with Influenza - per primary  6. Hyperglycemia - a1c is 5.5. Likely high in the setting of stress.   LOS: 5 days   Will sign off.  Call for questions     Sagan Maselli, Wonda Cheng, MD  08/06/2015 9:53 AM    [place],  [place], [place]  [address] Pager [phone] Phone: [phone]; Fax: [phone]   [facility]  [address] [place],   [address] [phone]   Fax [phone]

## 2015-08-06 NOTE — Progress Notes (Addendum)
Pt having slight SOB this am refusing to take breathing treatment, educated pt. Still continues to refuse. Increased 02- 3.5 LNC 02 sat 95

## 2015-08-06 NOTE — Progress Notes (Signed)
CM received call from [facility] requesting DC summary so [facility] can arrange for equipment delivery to home (oxygen).  MD states discharge will not be today; CM relayed message to [place]. No other CM needs were communicated.

## 2015-08-07 LAB — BASIC METABOLIC PANEL
ANION GAP: 13 (ref 5–15)
BUN: 91 mg/dL — AB (ref 6–20)
CHLORIDE: 106 mmol/L (ref 101–111)
CO2: 20 mmol/L — ABNORMAL LOW (ref 22–32)
Calcium: 6.7 mg/dL — ABNORMAL LOW (ref 8.9–10.3)
Creatinine, Ser: 5.6 mg/dL — ABNORMAL HIGH (ref 0.44–1.00)
GFR calc Af Amer: 8 mL/min — ABNORMAL LOW (ref >60)
GFR, EST NON AFRICAN AMERICAN: 7 mL/min — AB (ref >60)
Glucose, Bld: 92 mg/dL (ref 65–99)
POTASSIUM: 4.7 mmol/L (ref 3.5–5.1)
Sodium: 139 mmol/L (ref 135–145)

## 2015-08-07 LAB — CBC
HCT: 32.7 % — ABNORMAL LOW (ref 36.0–46.0)
Hemoglobin: 9.9 g/dL — ABNORMAL LOW (ref 12.0–15.0)
MCH: 26.1 pg (ref 26.0–34.0)
MCHC: 30.3 g/dL (ref 30.0–36.0)
MCV: 86.1 fL (ref 78.0–100.0)
Platelets: 155 10*3/uL (ref 150–400)
RBC: 3.8 MIL/uL — AB (ref 3.87–5.11)
RDW: 16.3 % — ABNORMAL HIGH (ref 11.5–15.5)
WBC: 3.9 10*3/uL — AB (ref 4.0–10.5)

## 2015-08-07 LAB — CALCIUM, IONIZED: CALCIUM, IONIZED, SERUM: 3.4 mg/dL — AB (ref 4.5–5.6)

## 2015-08-07 LAB — LEGIONELLA ANTIGEN, URINE

## 2015-08-07 LAB — CULTURE, BLOOD (ROUTINE X 2)
CULTURE: NO GROWTH
Culture: NO GROWTH

## 2015-08-07 MED ORDER — OSELTAMIVIR PHOSPHATE 30 MG PO CAPS
30.0000 mg | ORAL_CAPSULE | Freq: Every day | ORAL | Status: DC
Start: 2015-08-07 — End: 2016-06-12

## 2015-08-07 MED ORDER — CEFUROXIME AXETIL 500 MG PO TABS
500.0000 mg | ORAL_TABLET | Freq: Every day | ORAL | Status: DC
Start: 2015-08-07 — End: 2016-06-12

## 2015-08-07 MED ORDER — AZITHROMYCIN 250 MG PO TABS: ORAL_TABLET | ORAL | Status: DC

## 2015-08-07 MED ORDER — OXYCODONE HCL 5 MG PO TABS
2.5000 mg | ORAL_TABLET | Freq: Two times a day (BID) | ORAL | Status: DC | PRN
Start: 2015-08-07 — End: 2016-06-14

## 2015-08-07 NOTE — Progress Notes (Signed)
Pt's son brought the O2 tank from home. D/c instructions discussed and family. All answered questions, and pt verbalized understanding. Pt d/c home via [place] accompanied by her son and grand-son.

## 2015-08-07 NOTE — Discharge Summary (Signed)
Physician Discharge Summary   Patient ID: [name] MRN: [id number] DOB/AGE: 08-21-1934 81 y.o.  Admit date: 08/01/2015 Discharge date: 08/07/2015  Primary Care Physician:  Lynne Logan, MD  Discharge Diagnoses:    . NSTEMI (non-ST elevated myocardial infarction) ([place]) . Acute systolic congestive heart failure, NYHA class 4 ([place]) . 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 CAP (community acquired pneumonia) Influenza A+ Acute on chronic kidney disease stage IV  Consults: Cardiology  Recommendations for Outpatient Follow-up:  1. Home health PT, OT, HHA,RN hospice arranged 2. Please repeat BMET at next visit 3. Follow-up chest x-ray in 3-4 weeks to ensure complete resolution of pneumonia   DIET: Heart healthy diet    Allergies:  No Known Allergies   DISCHARGE MEDICATIONS: Current Discharge Medication List    START taking these medications   Details  azithromycin (ZITHROMAX) 250 MG tablet Take 2 more days to finish the course Qty: 2 each, Refills: 0    cefUROXime (CEFTIN) 500 MG tablet Take 1 tablet (500 mg total) by mouth daily. X 2 more days to complete the course Qty: 2 tablet, Refills: 0    oseltamivir (TAMIFLU) 30 MG capsule Take 1 capsule (30 mg total) by mouth daily. Take one more day to finish the course Qty: 1 capsule, Refills: 0    oxyCODONE (ROXICODONE) 5 MG immediate release tablet Take 0.5-1 tablets (2.5-5 mg total) by mouth every 12 (twelve) hours as needed (shortness of breath). Qty: 30 tablet, Refills: 0      CONTINUE these medications which have NOT CHANGED   Details  amLODipine (NORVASC) 5 MG tablet Take 1 tablet (5 mg total) by mouth daily. Qty: 30 tablet, Refills: 6    aspirin 81 MG chewable tablet Chew 1 tablet (81 mg total) by mouth daily.    atorvastatin (LIPITOR) 20 MG tablet Take 1 tablet (20 mg total) by mouth daily at 6 PM. Qty: 30 tablet,  Refills: 11    carvedilol (COREG) 25 MG tablet Take 1 tablet (25 mg total) by mouth 2 (two) times daily with a meal. Qty: 60 tablet, Refills: 11    isosorbide mononitrate (IMDUR) 60 MG 24 hr tablet Take 1 tablet (60 mg total) by mouth daily. Qty: 30 tablet, Refills: 11    nitroGLYCERIN (NITROSTAT) 0.4 MG SL tablet Place 1 tablet (0.4 mg total) under the tongue every 5 (five) minutes as needed for chest pain. Qty: 25 tablet, Refills: prn   Associated Diagnoses: Essential hypertension, benign; Chest pain, unspecified chest pain type    ranolazine (RANEXA) 500 MG 12 hr tablet Take 1 tablet (500 mg total) by mouth 2 (two) times daily. Qty: 60 tablet, Refills: 11      STOP taking these medications     furosemide (LASIX) 40 MG tablet          Brief H and P: For complete details please refer to admission H and P, but in briefBarbara W Guglielmi is a 81 y.o. female with h/o CAD, MI, CKD stage 4, CVA (ischemic in 2008, hemorrhagic in 2012), HTN presented to ED with shortness of breath, chest pain and increasing dyspnea, worsening over the last 2 days. No fevers or chills. The patient had been taking a lot of NTG at home for chest pain (5 SL NTG), currently pain free but does get pain with minimal activity (walking to bathroom). Breathing also improved modestly with neb treatments in ED. ED  workup showed positive troponin of 0.55, EKG is ischemic, NSTEMI. Cardiology was consulted and patient was placed on heparin drip and admitted to stepdown unit.  Hospital Course:   NSTEMI (non-ST elevated myocardial infarction) [facility]) - Cardiology was consulted. Patient had no chest on admission. She was placed on IV heparin drip for 48 hours. - Patient was continued on aspirin, Lipitor, Coreg, Imdur, Ranexa - Patient refused cardiac cath, due to risk of hemodialysis with stage IV CKD. Hence cardiology recommended medical management at this time. - 2-D echo showed EF of 30-35% with diffuse hypokinesis,  grade 1 diastolic dysfunction - palliative medicine was consulted, goals of care were established, DO NOT RESUSCITATE, home with hospice   Active Problems:  Acute systolic congestive heart failure, NYHA class 4 (HCC) likely exacerbated by hypertensive emergency, noncompliance, pneumonia - BNP> 4000 at the time of admission,  patient was placed on IV Lasix. Negative balance of 4.5 liters  -  Not on ACEI/ARB due to chronic kidney disease - Creatinine however continue to to trend up, let 2 at 6.2, plateaued at 6.2, lasix was discontinued 2/17. Patient was given an small amount of IV fluids on 2/19 as she appeared to be clinically dehydrated. Creatinine improved to 5.6. She is cleared from cardiology for discharge.    Influenza A positive - Started on Tamiflu with renal dosing day, patient will complete the course with 1 more day   lignant hypertensive heart and kidney disease with combined systolic and diastolic CHF, NYHA class 4 and CKD stage 4 (HCC) - BP 210/97 at the time of admission - Continue amlodipine, Imdur, Coreg    CAP (community acquired pneumonia) -  patient was placed on IV Zithromax and Rocephin. She was placed on duonebs - Chest x-ray showed new focal right upper lobe opacity, follow-up chest x-ray in 3-4 weeks - Blood cultures negative to date  urine strep antigen negative   acute onCK D stage IV: Likely worsened due to diuresis Baseline creatinine ~3, creatinine now improving, Lasix on hold  Hyperglycemia with no underlying history of diabetes - hemoglobin A1c5.5  Diarrhea - Resolved   Day of Discharge BP 142/54 mmHg  Pulse 55  Temp(Src) 98.9 F (37.2 C) (Oral)  Resp 18  Ht 5' 4.96" (1.65 m)  Wt 77.792 kg (171 lb 8 oz)  BMI 28.57 kg/m2  SpO2 97%  Physical Exam: General: Alert and awake oriented x3 not in any acute distress. HEENT: anicteric sclera, pupils reactive to light and accommodation CVS: S1-S2 clear no murmur rubs or gallops Chest: clear  to auscultation bilaterally, no wheezing rales or rhonchi Abdomen: soft nontender, nondistended, normal bowel sounds Extremities: no cyanosis, clubbing or edema noted bilaterally Neuro: Cranial nerves II-XII intact, no focal neurological deficits   The results of significant diagnostics from this hospitalization (including imaging, microbiology, ancillary and laboratory) are listed below for reference.    LAB RESULTS: Basic Metabolic Panel:  Recent Labs Lab 08/06/15 1617 08/07/15 0402  NA 136 139  K 4.4 4.7  CL 102 106  CO2 21* 20*  GLUCOSE 126* 92  BUN 95* 91*  CREATININE 5.85* 5.60*  CALCIUM 6.4* 6.7*   Liver Function Tests:  Recent Labs Lab 08/06/15 1802  ALBUMIN 2.8*   No results for input(s): LIPASE, AMYLASE in the last 168 hours. No results for input(s): AMMONIA in the last 168 hours. CBC:  Recent Labs Lab 08/01/15 1922  08/06/15 0329 08/07/15 0402  WBC 7.0  < > 3.6* 3.9*  NEUTROABS 5.3  --   --   --  HGB 12.1  < > 9.3* 9.9*  HCT 40.2  < > 31.1* 32.7*  MCV 88.0  < > 86.4 86.1  PLT 189  < > 151 155  < > = values in this interval not displayed. Cardiac Enzymes:  Recent Labs Lab 08/02/15 0334 08/02/15 0948  TROPONINI 0.81* 0.84*   BNP: Invalid input(s): POCBNP CBG: No results for input(s): GLUCAP in the last 168 hours.  Significant Diagnostic Studies:  Dg Chest Port 1 View  08/01/2015  CLINICAL DATA:  Short of breath EXAM: PORTABLE CHEST 1 VIEW COMPARISON:  02/20/2014 FINDINGS: The heart is moderately enlarged. Aorta remains tortuous. A focal opacity in the right upper lung zone has developed. Pulmonary vascularity is within normal limits. No pneumothorax or pleural effusion. IMPRESSION: New focal right upper lobe opacity has developed. Followup PA and lateral chest X-ray is recommended in 3-4 weeks following trial of antibiotic therapy to ensure resolution and exclude underlying malignancy. Electronically Signed   By: Marybelle Killings M.D.   On:  08/01/2015 19:33    2D ECHO: Study Conclusions  - Left ventricle: The cavity size was normal. There was moderate concentric hypertrophy. Systolic function was moderately to severely reduced. The estimated ejection fraction was in the range of 30% to 35%. Diffuse hypokinesis. Doppler parameters are consistent with abnormal left ventricular relaxation (grade 1 diastolic dysfunction). Doppler parameters are consistent with elevated ventricular end-diastolic filling pressure. - Ventricular septum: Septal motion showed abnormal function, dyssynergy, and paradox. - Aortic valve: Trileaflet; normal thickness leaflets. There was no regurgitation. - Aortic root: The aortic root was normal in size. - Mitral valve: Calcified annulus. There was no regurgitation. - Left atrium: The atrium was mildly dilated. - Right ventricle: The cavity size was normal. Wall thickness was normal. Systolic function was mildly reduced. - Tricuspid valve: There was no regurgitation. - Pulmonary arteries: Systolic pressure was within the normal range. - Inferior vena cava: The vessel was normal in size. - Pericardium, extracardiac: There was no pericardial effusion.   Disposition and Follow-up: Discharge Instructions    Diet - low sodium heart healthy    Complete by:  As directed      Increase activity slowly    Complete by:  As directed             DISPOSITION: Home with hospice    DISCHARGE FOLLOW-UP Follow-up Information    Follow up with [place].   Specialty:  Hospice Services   Why:  hospice services   Contact information:   [address] [place] [address] [phone]       Follow up with Warren Danes, MD. Schedule an appointment as soon as possible for a visit in 10 days.   Specialty:  Cardiology   Why:  for hospital follow-up, obtain labs BMET   Contact information:   [place] [address] [place] [address] [phone]         Time spent on Discharge: 34mins   Signed:   Dayana Dalporto M.D. [facility] 08/07/2015, 11:16 AM Pager: [phone]

## 2015-08-07 NOTE — Progress Notes (Signed)
Physical Therapy Treatment Patient Details Name: [name] MRN: [id number] DOB: [birth year] Today's Date: 08/07/2015    History of Present Illness [name] is a 81 y.o. female with h/o CAD, MI, CKD stage 4, CVA (ischemic in 2008, hemorrhagic in 2012), HTN presented to ED with shortness of breath, chest pain and increasing dyspnea, worsening over the last 2 days. No fevers or chills. The patient had been taking a lot of NTG at home for chest pain (5 SL NTG), currently pain free but does get pain with minimal activity (walking to bathroom). Breathing also improved modestly with neb treatments in ED.    PT Comments    Pt admitted with above diagnosis. Pt currently with functional limitations due to balance and endurance deficits. Pt going home today.  Assisted pt getting her clothes on.  She put shirt on by herself but needed assist with panties and pants.  Going home with Hospice care.  Still requires min to min guard assist with poor safety awareness overall.   Pt will benefit from skilled PT to increase their independence and safety with mobility to allow discharge to the venue listed below.    Follow Up Recommendations  Other (comment) (pt to receive Hospice at home,HHPT safety eval)     Equipment Recommendations  Hospital bed (home O2)    Recommendations for Other Services       Precautions / Restrictions Precautions Precautions: Fall Precaution Comments: watch O2 Restrictions Weight Bearing Restrictions: No    Mobility  Bed Mobility Overal bed mobility: Needs Assistance Bed Mobility: Supine to Sit     Supine to sit: Independent     General bed mobility comments: Threaded LEs through panties and pants with pt as pt going home later today.  Pt needed total assist to don these in sitting.   Transfers Overall transfer level: Needs assistance Equipment used: None Transfers: Sit to/from Stand Sit to Stand: Min guard         General transfer comment:  Pt able to stand and was ready for provide guard assist but not necessary.  Pt stood and pulled up underwear and pants with min guard assist.   Ambulation/Gait             General Gait Details: Refused to ambulate at this time.    Stairs            Wheelchair Mobility    Modified Rankin (Stroke Patients Only)       Balance Overall balance assessment: Needs assistance;History of Falls Sitting-balance support: No upper extremity supported;Feet supported Sitting balance-Leahy Scale: Good     Standing balance support: No upper extremity supported;During functional activity Standing balance-Leahy Scale: Fair Standing balance comment: can stand statically and pull up panties and pants but cannot withstand challenges to balance.                      Cognition Arousal/Alertness: Awake/alert Behavior During Therapy: Flat affect Overall Cognitive Status: History of cognitive impairments - at baseline                      Exercises      General Comments        Pertinent Vitals/Pain Pain Assessment: No/denies pain  On 4LO2 with sats >90% but did not remove the O2.      Home Living  Prior Function            PT Goals (current goals can now be found in the care plan section) Progress towards PT goals: Progressing toward goals    Frequency  Min 3X/week    PT Plan Discharge plan needs to be updated    Co-evaluation             End of Session Equipment Utilized During Treatment: Gait belt;Oxygen Activity Tolerance: Patient limited by fatigue Patient left: with call bell/phone within reach;with bed alarm set (sitting on EOB)     Time: [id number] PT Time Calculation (min) (ACUTE ONLY): 12 min  Charges:  $Therapeutic Activity: 8-22 mins                    G CodesIrwin Brakeman F 16-Aug-2015, 10:48 AM [facility] [phone] (pager)

## 2015-08-25 ENCOUNTER — Telehealth: Payer: Self-pay | Admitting: *Deleted

## 2015-10-11 NOTE — Telephone Encounter (Signed)
Mailed a letter today for patient to contact the office to schedule her colonoscopy for the hospital

## 2016-06-04 ENCOUNTER — Telehealth: Payer: Self-pay | Admitting: Internal Medicine

## 2016-06-04 MED ORDER — CARVEDILOL 25 MG PO TABS: 25.0000 mg | ORAL_TABLET | Freq: Two times a day (BID) | ORAL | 0 refills | Status: DC

## 2016-06-04 NOTE — Telephone Encounter (Signed)
New Message   *STAT* If patient is at the pharmacy, call can be transferred to refill team.   1. Which medications need to be refilled? (please list name of each medication and dose if known)  isosorbide monotrate (Imdur) 60 mg 24 hr tablets carvedilol (Coreg) 25 mg tablet take twice daily  2. Which pharmacy/location (including street and city if local pharmacy) is medication to be sent to? [facility] [address], [place]  3. Do they need a 30 day or 90 day supply? 90 days

## 2016-06-12 ENCOUNTER — Emergency Department (HOSPITAL_COMMUNITY): Payer: Medicare Other

## 2016-06-12 ENCOUNTER — Encounter (HOSPITAL_COMMUNITY): Payer: Self-pay | Admitting: Emergency Medicine

## 2016-06-12 ENCOUNTER — Inpatient Hospital Stay (HOSPITAL_COMMUNITY)
Admission: EM | Admit: 2016-06-12 | Discharge: 2016-06-14 | DRG: 189 | Disposition: A | Discharge status: Home / Self Care | Payer: Medicare Other | Attending: Internal Medicine | Admitting: Internal Medicine

## 2016-06-12 DIAGNOSIS — F17210 Nicotine dependence, cigarettes, uncomplicated: Secondary | ICD-10-CM | POA: Diagnosis present

## 2016-06-12 DIAGNOSIS — D649 Anemia, unspecified: Secondary | ICD-10-CM | POA: Diagnosis present

## 2016-06-12 DIAGNOSIS — B852 Pediculosis, unspecified: Secondary | ICD-10-CM | POA: Diagnosis present

## 2016-06-12 DIAGNOSIS — J449 Chronic obstructive pulmonary disease, unspecified: Secondary | ICD-10-CM | POA: Diagnosis present

## 2016-06-12 DIAGNOSIS — Z7189 Other specified counseling: Secondary | ICD-10-CM | POA: Diagnosis not present

## 2016-06-12 DIAGNOSIS — Z8249 Family history of ischemic heart disease and other diseases of the circulatory system: Secondary | ICD-10-CM

## 2016-06-12 DIAGNOSIS — E872 Acidosis, unspecified: Secondary | ICD-10-CM | POA: Diagnosis present

## 2016-06-12 DIAGNOSIS — J9621 Acute and chronic respiratory failure with hypoxia: Secondary | ICD-10-CM | POA: Diagnosis present

## 2016-06-12 DIAGNOSIS — J810 Acute pulmonary edema: Principal | ICD-10-CM | POA: Diagnosis present

## 2016-06-12 DIAGNOSIS — I2510 Atherosclerotic heart disease of native coronary artery without angina pectoris: Secondary | ICD-10-CM | POA: Diagnosis present

## 2016-06-12 DIAGNOSIS — Z90710 Acquired absence of both cervix and uterus: Secondary | ICD-10-CM | POA: Diagnosis not present

## 2016-06-12 DIAGNOSIS — Z66 Do not resuscitate: Secondary | ICD-10-CM | POA: Diagnosis present

## 2016-06-12 DIAGNOSIS — Z9981 Dependence on supplemental oxygen: Secondary | ICD-10-CM

## 2016-06-12 DIAGNOSIS — N186 End stage renal disease: Secondary | ICD-10-CM | POA: Diagnosis present

## 2016-06-12 DIAGNOSIS — I248 Other forms of acute ischemic heart disease: Secondary | ICD-10-CM | POA: Diagnosis present

## 2016-06-12 DIAGNOSIS — I161 Hypertensive emergency: Secondary | ICD-10-CM | POA: Diagnosis present

## 2016-06-12 DIAGNOSIS — D631 Anemia in chronic kidney disease: Secondary | ICD-10-CM | POA: Diagnosis present

## 2016-06-12 DIAGNOSIS — J9600 Acute respiratory failure, unspecified whether with hypoxia or hypercapnia: Secondary | ICD-10-CM | POA: Diagnosis present

## 2016-06-12 DIAGNOSIS — Z8673 Personal history of transient ischemic attack (TIA), and cerebral infarction without residual deficits: Secondary | ICD-10-CM

## 2016-06-12 DIAGNOSIS — N185 Chronic kidney disease, stage 5: Secondary | ICD-10-CM | POA: Diagnosis not present

## 2016-06-12 DIAGNOSIS — F329 Major depressive disorder, single episode, unspecified: Secondary | ICD-10-CM | POA: Diagnosis present

## 2016-06-12 DIAGNOSIS — Z515 Encounter for palliative care: Secondary | ICD-10-CM | POA: Diagnosis not present

## 2016-06-12 DIAGNOSIS — Z85820 Personal history of malignant melanoma of skin: Secondary | ICD-10-CM

## 2016-06-12 DIAGNOSIS — Z79899 Other long term (current) drug therapy: Secondary | ICD-10-CM

## 2016-06-12 DIAGNOSIS — I132 Hypertensive heart and chronic kidney disease with heart failure and with stage 5 chronic kidney disease, or end stage renal disease: Secondary | ICD-10-CM | POA: Diagnosis present

## 2016-06-12 DIAGNOSIS — Z7982 Long term (current) use of aspirin: Secondary | ICD-10-CM

## 2016-06-12 DIAGNOSIS — I252 Old myocardial infarction: Secondary | ICD-10-CM | POA: Diagnosis not present

## 2016-06-12 DIAGNOSIS — I5021 Acute systolic (congestive) heart failure: Secondary | ICD-10-CM | POA: Diagnosis present

## 2016-06-12 DIAGNOSIS — Z955 Presence of coronary angioplasty implant and graft: Secondary | ICD-10-CM | POA: Diagnosis not present

## 2016-06-12 DIAGNOSIS — J9601 Acute respiratory failure with hypoxia: Secondary | ICD-10-CM | POA: Diagnosis not present

## 2016-06-12 DIAGNOSIS — Z9114 Patient's other noncompliance with medication regimen: Secondary | ICD-10-CM

## 2016-06-12 DIAGNOSIS — K219 Gastro-esophageal reflux disease without esophagitis: Secondary | ICD-10-CM | POA: Diagnosis present

## 2016-06-12 DIAGNOSIS — R079 Chest pain, unspecified: Secondary | ICD-10-CM

## 2016-06-12 DIAGNOSIS — I10 Essential (primary) hypertension: Secondary | ICD-10-CM

## 2016-06-12 HISTORY — DX: Dependence on supplemental oxygen: Z99.81

## 2016-06-12 LAB — CBC WITH DIFFERENTIAL/PLATELET
Basophils Absolute: 0 10*3/uL (ref 0.0–0.1)
Basophils Absolute: 0 10*3/uL (ref 0.0–0.1)
Basophils Relative: 0 %
Basophils Relative: 0 %
EOS ABS: 0.1 10*3/uL (ref 0.0–0.7)
Eosinophils Absolute: 0 10*3/uL (ref 0.0–0.7)
Eosinophils Relative: 1 %
Eosinophils Relative: 0 %
HCT: 33 % — ABNORMAL LOW (ref 36.0–46.0)
HEMATOCRIT: 29.4 % — AB (ref 36.0–46.0)
HEMOGLOBIN: 8.7 g/dL — AB (ref 12.0–15.0)
HEMOGLOBIN: 9.9 g/dL — AB (ref 12.0–15.0)
LYMPHS ABS: 0.2 10*3/uL — AB (ref 0.7–4.0)
LYMPHS ABS: 0.5 10*3/uL — AB (ref 0.7–4.0)
LYMPHS PCT: 2 %
Lymphocytes Relative: 4 %
MCH: 26.1 pg (ref 26.0–34.0)
MCH: 26.3 pg (ref 26.0–34.0)
MCHC: 29.6 g/dL — AB (ref 30.0–36.0)
MCHC: 30 g/dL (ref 30.0–36.0)
MCV: 87.8 fL (ref 78.0–100.0)
MCV: 88.3 fL (ref 78.0–100.0)
MONO ABS: 0.4 10*3/uL (ref 0.1–1.0)
MONOS PCT: 1 %
MONOS PCT: 3 %
Monocytes Absolute: 0.1 10*3/uL (ref 0.1–1.0)
NEUTROS ABS: 10 10*3/uL — AB (ref 1.7–7.7)
NEUTROS PCT: 92 %
NEUTROS PCT: 97 %
Neutro Abs: 12 10*3/uL — ABNORMAL HIGH (ref 1.7–7.7)
Platelets: 266 10*3/uL (ref 150–400)
Platelets: 275 10*3/uL (ref 150–400)
RBC: 3.76 MIL/uL — ABNORMAL LOW (ref 3.87–5.11)
RBC: 3.33 MIL/uL — ABNORMAL LOW (ref 3.87–5.11)
RDW: 17.9 % — AB (ref 11.5–15.5)
RDW: 18 % — ABNORMAL HIGH (ref 11.5–15.5)
WBC: 13.1 10*3/uL — ABNORMAL HIGH (ref 4.0–10.5)
WBC: 10.3 10*3/uL (ref 4.0–10.5)

## 2016-06-12 LAB — TROPONIN I
TROPONIN I: 0.8 ng/mL — AB (ref <0.03)
TROPONIN I: 1.35 ng/mL — AB (ref <0.03)
Troponin I: 1.28 ng/mL (ref <0.03)

## 2016-06-12 LAB — COMPREHENSIVE METABOLIC PANEL
ALK PHOS: 43 U/L (ref 38–126)
ALT: 15 U/L (ref 14–54)
ALT: 14 U/L (ref 14–54)
ANION GAP: 10 (ref 5–15)
ANION GAP: 11 (ref 5–15)
AST: 17 U/L (ref 15–41)
AST: 19 U/L (ref 15–41)
Albumin: 3.1 g/dL — ABNORMAL LOW (ref 3.5–5.0)
Albumin: 2.8 g/dL — ABNORMAL LOW (ref 3.5–5.0)
Alkaline Phosphatase: 38 U/L (ref 38–126)
BILIRUBIN TOTAL: 0.4 mg/dL (ref 0.3–1.2)
BUN: 71 mg/dL — ABNORMAL HIGH (ref 6–20)
BUN: 74 mg/dL — ABNORMAL HIGH (ref 6–20)
CALCIUM: 7.1 mg/dL — AB (ref 8.9–10.3)
CHLORIDE: 116 mmol/L — AB (ref 101–111)
CO2: 19 mmol/L — ABNORMAL LOW (ref 22–32)
CO2: 18 mmol/L — AB (ref 22–32)
Calcium: 6.9 mg/dL — ABNORMAL LOW (ref 8.9–10.3)
Chloride: 115 mmol/L — ABNORMAL HIGH (ref 101–111)
Creatinine, Ser: 6.06 mg/dL — ABNORMAL HIGH (ref 0.44–1.00)
Creatinine, Ser: 5.77 mg/dL — ABNORMAL HIGH (ref 0.44–1.00)
GFR calc non Af Amer: 6 mL/min — ABNORMAL LOW (ref >60)
GFR calc non Af Amer: 6 mL/min — ABNORMAL LOW (ref >60)
GFR, EST AFRICAN AMERICAN: 7 mL/min — AB (ref >60)
GFR, EST AFRICAN AMERICAN: 7 mL/min — AB (ref >60)
Glucose, Bld: 203 mg/dL — ABNORMAL HIGH (ref 65–99)
Glucose, Bld: 150 mg/dL — ABNORMAL HIGH (ref 65–99)
POTASSIUM: 4.7 mmol/L (ref 3.5–5.1)
Potassium: 4.2 mmol/L (ref 3.5–5.1)
SODIUM: 144 mmol/L (ref 135–145)
SODIUM: 145 mmol/L (ref 135–145)
TOTAL PROTEIN: 6.6 g/dL (ref 6.5–8.1)
Total Bilirubin: 0.2 mg/dL — ABNORMAL LOW (ref 0.3–1.2)
Total Protein: 6.2 g/dL — ABNORMAL LOW (ref 6.5–8.1)

## 2016-06-12 LAB — I-STAT ARTERIAL BLOOD GAS, ED
ACID-BASE DEFICIT: 8 mmol/L — AB (ref 0.0–2.0)
Bicarbonate: 19.4 mmol/L — ABNORMAL LOW (ref 20.0–28.0)
O2 SAT: 97 %
TCO2: 21 mmol/L (ref 0–100)
pCO2 arterial: 48.8 mmHg — ABNORMAL HIGH (ref 32.0–48.0)
pH, Arterial: 7.204 — ABNORMAL LOW (ref 7.350–7.450)
pO2, Arterial: 105 mmHg (ref 83.0–108.0)

## 2016-06-12 LAB — I-STAT TROPONIN, ED: Troponin i, poc: 0.08 ng/mL (ref 0.00–0.08)

## 2016-06-12 LAB — BRAIN NATRIURETIC PEPTIDE: B NATRIURETIC PEPTIDE 5: 2232.6 pg/mL — AB (ref 0.0–100.0)

## 2016-06-12 LAB — TSH: TSH: 0.724 u[IU]/mL (ref 0.350–4.500)

## 2016-06-12 MED ORDER — AMLODIPINE BESYLATE 5 MG PO TABS
5.0000 mg | ORAL_TABLET | Freq: Every day | ORAL | Status: DC
Start: 2016-06-12 — End: 2016-06-12
  Administered 2016-06-12: 5 mg via ORAL
  Filled 2016-06-12: qty 1

## 2016-06-12 MED ORDER — FUROSEMIDE 10 MG/ML IJ SOLN
80.0000 mg | Freq: Once | INTRAMUSCULAR | Status: AC
  Administered 2016-06-12: 80 mg via INTRAVENOUS
  Filled 2016-06-12: qty 8

## 2016-06-12 MED ORDER — IPRATROPIUM-ALBUTEROL 0.5-2.5 (3) MG/3ML IN SOLN
3.0000 mL | Freq: Four times a day (QID) | RESPIRATORY_TRACT | Status: DC
  Administered 2016-06-12: 3 mL via RESPIRATORY_TRACT
  Filled 2016-06-12: qty 3

## 2016-06-12 MED ORDER — ATORVASTATIN CALCIUM 20 MG PO TABS
20.0000 mg | ORAL_TABLET | Freq: Every day | ORAL | Status: DC
  Administered 2016-06-12 – 2016-06-13 (×2): 20 mg via ORAL
  Filled 2016-06-12 (×2): qty 1

## 2016-06-12 MED ORDER — NEPRO/CARBSTEADY PO LIQD
237.0000 mL | Freq: Two times a day (BID) | ORAL | Status: DC
  Administered 2016-06-13 (×2): 237 mL via ORAL
  Filled 2016-06-12 (×6): qty 237

## 2016-06-12 MED ORDER — METHYLPREDNISOLONE SODIUM SUCC 125 MG IJ SOLR: 60.0000 mg | Freq: Three times a day (TID) | INTRAMUSCULAR | Status: DC

## 2016-06-12 MED ORDER — HEPARIN SODIUM (PORCINE) 5000 UNIT/ML IJ SOLN
5000.0000 [IU] | Freq: Three times a day (TID) | INTRAMUSCULAR | Status: DC
  Administered 2016-06-12 – 2016-06-14 (×6): 5000 [IU] via SUBCUTANEOUS
  Filled 2016-06-12 (×6): qty 1

## 2016-06-12 MED ORDER — ACETAMINOPHEN 650 MG RE SUPP: 650.0000 mg | Freq: Four times a day (QID) | RECTAL | Status: DC | PRN

## 2016-06-12 MED ORDER — IPRATROPIUM-ALBUTEROL 0.5-2.5 (3) MG/3ML IN SOLN
3.0000 mL | Freq: Three times a day (TID) | RESPIRATORY_TRACT | Status: DC
  Administered 2016-06-13 (×3): 3 mL via RESPIRATORY_TRACT
  Filled 2016-06-12 (×4): qty 3

## 2016-06-12 MED ORDER — RANOLAZINE ER 500 MG PO TB12
500.0000 mg | ORAL_TABLET | Freq: Two times a day (BID) | ORAL | Status: DC
  Administered 2016-06-12 – 2016-06-14 (×4): 500 mg via ORAL
  Filled 2016-06-12 (×5): qty 1

## 2016-06-12 MED ORDER — NITROGLYCERIN IN D5W 200-5 MCG/ML-% IV SOLN
INTRAVENOUS | Status: AC
  Filled 2016-06-12: qty 250

## 2016-06-12 MED ORDER — FUROSEMIDE 10 MG/ML IJ SOLN
120.0000 mg | Freq: Three times a day (TID) | INTRAVENOUS | Status: DC
  Administered 2016-06-12 – 2016-06-14 (×4): 120 mg via INTRAVENOUS
  Filled 2016-06-12 (×8): qty 12

## 2016-06-12 MED ORDER — FUROSEMIDE 10 MG/ML IJ SOLN
80.0000 mg | Freq: Two times a day (BID) | INTRAMUSCULAR | Status: DC
Start: 2016-06-12 — End: 2016-06-12
  Administered 2016-06-12: 80 mg via INTRAVENOUS
  Filled 2016-06-12: qty 8

## 2016-06-12 MED ORDER — ASPIRIN 81 MG PO CHEW
81.0000 mg | CHEWABLE_TABLET | Freq: Every day | ORAL | Status: DC
  Administered 2016-06-13: 81 mg via ORAL
  Filled 2016-06-12: qty 1

## 2016-06-12 MED ORDER — CARVEDILOL 25 MG PO TABS
25.0000 mg | ORAL_TABLET | Freq: Two times a day (BID) | ORAL | Status: DC
  Administered 2016-06-12 – 2016-06-14 (×5): 25 mg via ORAL
  Filled 2016-06-12 (×2): qty 1
  Filled 2016-06-12: qty 2
  Filled 2016-06-12 (×2): qty 1

## 2016-06-12 MED ORDER — NITROGLYCERIN IN D5W 200-5 MCG/ML-% IV SOLN
0.0000 ug/min | Freq: Once | INTRAVENOUS | Status: AC
  Administered 2016-06-12: 100 ug/min via INTRAVENOUS
  Filled 2016-06-12: qty 250

## 2016-06-12 MED ORDER — ONDANSETRON HCL 4 MG/2ML IJ SOLN: 4.0000 mg | Freq: Four times a day (QID) | INTRAMUSCULAR | Status: DC | PRN

## 2016-06-12 MED ORDER — ISOSORBIDE MONONITRATE ER 60 MG PO TB24
60.0000 mg | ORAL_TABLET | Freq: Every day | ORAL | Status: DC
  Administered 2016-06-12 – 2016-06-14 (×3): 60 mg via ORAL
  Filled 2016-06-12: qty 2
  Filled 2016-06-12 (×2): qty 1

## 2016-06-12 MED ORDER — PERMETHRIN 1 % EX LOTN
TOPICAL_LOTION | Freq: Once | CUTANEOUS | Status: AC
  Administered 2016-06-12: 23:00:00 via TOPICAL
  Filled 2016-06-12: qty 59

## 2016-06-12 MED ORDER — AMLODIPINE BESYLATE 10 MG PO TABS
10.0000 mg | ORAL_TABLET | Freq: Every day | ORAL | Status: DC
  Administered 2016-06-13 – 2016-06-14 (×2): 10 mg via ORAL
  Filled 2016-06-12 (×2): qty 1

## 2016-06-12 MED ORDER — NITROGLYCERIN IN D5W 200-5 MCG/ML-% IV SOLN
0.0000 ug/min | Freq: Once | INTRAVENOUS | Status: DC
  Filled 2016-06-12: qty 250

## 2016-06-12 MED ORDER — FUROSEMIDE 10 MG/ML IJ SOLN
40.0000 mg | Freq: Once | INTRAMUSCULAR | Status: AC
  Administered 2016-06-12: 40 mg via INTRAVENOUS
  Filled 2016-06-12: qty 4

## 2016-06-12 MED ORDER — HYDRALAZINE HCL 20 MG/ML IJ SOLN: 10.0000 mg | Freq: Four times a day (QID) | INTRAMUSCULAR | Status: DC | PRN

## 2016-06-12 MED ORDER — ALBUTEROL SULFATE (2.5 MG/3ML) 0.083% IN NEBU
2.5000 mg | INHALATION_SOLUTION | RESPIRATORY_TRACT | Status: DC | PRN
  Administered 2016-06-12: 2.5 mg via RESPIRATORY_TRACT

## 2016-06-12 MED ORDER — NITROGLYCERIN 0.4 MG SL SUBL
0.4000 mg | SUBLINGUAL_TABLET | SUBLINGUAL | Status: DC | PRN
  Administered 2016-06-12: 0.4 mg via SUBLINGUAL

## 2016-06-12 MED ORDER — ACETAMINOPHEN 325 MG PO TABS: 650.0000 mg | ORAL_TABLET | Freq: Four times a day (QID) | ORAL | Status: DC | PRN

## 2016-06-12 MED ORDER — ONDANSETRON HCL 4 MG PO TABS: 4.0000 mg | ORAL_TABLET | Freq: Four times a day (QID) | ORAL | Status: DC | PRN

## 2016-06-12 MED ORDER — ASPIRIN EC 81 MG PO TBEC
81.0000 mg | DELAYED_RELEASE_TABLET | Freq: Every day | ORAL | Status: DC
  Administered 2016-06-12 – 2016-06-14 (×2): 81 mg via ORAL
  Filled 2016-06-12 (×2): qty 1

## 2016-06-12 NOTE — Care Management Note (Addendum)
Case Management Note Marvetta Gibbons RN, BSN Unit 2W-Case Manager [phone]  Patient Details  Name: [name] MRN: [id number] Date of Birth: [birth year]  Subjective/Objective:  Pt admitted with acute on chronic resp. failure               Action/Plan: PTA pt lived at home with son, has a RW- referral received for PCP needs- spoke with pt at bedside- per conversation pt states that she had Home Hospice - but no longer wants hospice services- she also states that she has seen Dr. Nancy Fetter, Gari Crown with [facility] in the past for PCP needs- but has not been there in over a year. Pt reports that she has an up coming appointment on Jan. 22 with Dr. Dorris Carnes- (cards)- pt states that she does not need a PCP at this time- explained to pt that she needs a PCP for medical needs and prescription needs- CM will continue to follow for any further needs-- Call made to Dr. Lynnda Child office with [facility] to see if pt could return there as a patient- is is eligible to return- has a balance of $10 on account that would need to be paid- they do have an appointment available for Jan. 8 at 2:15- Discussed with pt and pt remains adamant that she only wants to see Dr. Harrington Challenger for now. If pt continues to not want to keep appointment with Dr. Nancy Fetter- appointment will need to be canceled.    Expected Discharge Date:                  Expected Discharge Plan:  Home/Self Care  In-House Referral:     Discharge planning Services  CM Consult  Post Acute Care Choice:    Choice offered to:     DME Arranged:    DME Agency:     HH Arranged:    HH Agency:     Status of Service:  In process, will continue to follow  If discussed at Long Length of Stay Meetings, dates discussed:    Additional Comments:  Dawayne Patricia, RN 06/12/2016, 3:58 PM

## 2016-06-12 NOTE — H&P (Signed)
History and Physical    [name] [id number] DOB: [birth year] DOA: 06/12/2016  PCP: Lynne Logan, MD  Patient coming from: Home.  Chief Complaint: Shortness of breath.  HPI: [name] is a 81 y.o. female with history of chronic kidney disease stage V, hypertension, non-ST elevation MI in February of this year presents to the ER because of worsening shortness of breath over the last 2-3 days. Denies any chest pain or palpitation diaphoresis nausea or vomiting. Patient is a very poor historian. Patient states she has not been taking her medications last few days. Not sure how long she has not been taking it. Unable to reach patient's son with whom patient lives. Patient was previously under hospice until June. Patient was found to be markedly hypertensive on presentation with chest x-ray showing pulmonary edema. Patient was started on IV nitroglycerin infusion and given Lasix total of 120 mg IV. Patient refuses dialysis or any CPR or intubation. Okay with continuing with conservative management. Patient also was placed on BiPAP.   ED Course: Chest x-ray shows congestion. Was started on IV nitroglycerin infusion. And Lasix 120 mg IV was given.  Review of Systems: As per HPI, rest all negative.   Past Medical History:  Diagnosis Date  . Anemia   . Arthritis   . CAD (coronary artery disease)   . CAD (coronary artery disease)    a. Stents in [facility] 2001 and cath 2008 with possible balloon or stent at [facility] (not available in [place]).   . Cancer ([place])    scalp  . Chronic combined systolic and diastolic CHF (congestive heart failure) ([place])    a. EF 45-50% in 0000000. b. Mixed systolic/diastolic - EF A999333 by echo 2014, 35% by nuc.  . CKD (chronic kidney disease), stage IV ([place]) 08/29/2012  . COPD (chronic obstructive pulmonary disease) ([place])   . Depression   . Gastrointestinal hemorrhage    a. Pt reports she has h/o this many years ago, requiring 2 units of blood.  Thinks it was in [place] but records not available. She does not know why this happened.  Marland Kitchen GERD (gastroesophageal reflux disease)   . H/O medication noncompliance    a. Per notes, due to cost.   . Headache(784.0)   . Hemorrhoid    a. Mild hemorrhoidal bleeding per previous DC note.  Marland Kitchen Hx of peptic ulcer   . Hypertension   . Myocardial infarction   . Nephrolithiasis   . Osteoarthritis, generalized   . Stroke [facility])    a. 2008: acute stroke in left centrum ovale. 06/2010: hemorrhagic stroke.    Past Surgical History:  Procedure Laterality Date  . ABDOMINAL HYSTERECTOMY    . APPENDECTOMY    . CHOLECYSTECTOMY    . COLON SURGERY    . COLONOSCOPY  07/18/10  . EYE SURGERY    . MELANOMA EXCISION  1989   distal right LE   . TONSILLECTOMY       reports that she has been smoking Cigarettes.  She has never used smokeless tobacco. She reports that she does not drink alcohol or use drugs.  No Known Allergies  Family History  Problem Relation Age of Onset  . Diabetes Mother   . Hypertension Mother   . Diabetes Sister   . Cancer Son     breast  . Diabetes Sister     Prior to Admission medications   Medication Sig Start Date End Date Taking? Authorizing Provider  amLODipine (NORVASC) 5 MG tablet  Take 1 tablet (5 mg total) by mouth daily. 03/24/14   Darlin Coco, MD  aspirin 81 MG chewable tablet Chew 1 tablet (81 mg total) by mouth daily. 09/02/12   Neena Rhymes, MD  atorvastatin (LIPITOR) 20 MG tablet Take 1 tablet (20 mg total) by mouth daily at 6 PM. 04/04/14   Darlin Coco, MD  azithromycin (ZITHROMAX) 250 MG tablet Take 2 more days to finish the course 08/07/15   Ripudeep K Rai, MD  carvedilol (COREG) 25 MG tablet Take 1 tablet (25 mg total) by mouth 2 (two) times daily with a meal. 06/04/16   Fay Records, MD  cefUROXime (CEFTIN) 500 MG tablet Take 1 tablet (500 mg total) by mouth daily. X 2 more days to complete the course 08/07/15   Ripudeep K Rai, MD  isosorbide  mononitrate (IMDUR) 60 MG 24 hr tablet Take 1 tablet (60 mg total) by mouth daily. 04/04/14   Darlin Coco, MD  nitroGLYCERIN (NITROSTAT) 0.4 MG SL tablet Place 1 tablet (0.4 mg total) under the tongue every 5 (five) minutes as needed for chest pain. 03/17/14   Darlin Coco, MD  oseltamivir (TAMIFLU) 30 MG capsule Take 1 capsule (30 mg total) by mouth daily. Take one more day to finish the course 08/07/15   Ripudeep K Rai, MD  oxyCODONE (ROXICODONE) 5 MG immediate release tablet Take 0.5-1 tablets (2.5-5 mg total) by mouth every 12 (twelve) hours as needed (shortness of breath). 08/07/15 08/06/16  Ripudeep Krystal Eaton, MD  ranolazine (RANEXA) 500 MG 12 hr tablet Take 1 tablet (500 mg total) by mouth 2 (two) times daily. 04/04/14   Darlin Coco, MD    Physical Exam: Vitals:   06/12/16 0415 06/12/16 0430 06/12/16 0445 06/12/16 0500  BP: 170/86 171/78 169/86 169/86  Pulse: 83 83 88 86  Resp: 20 22 21 22   Temp:    97.9 F (36.6 C)  TempSrc:    Oral  SpO2: 99% 99% 97% 100%  Weight:      Height:          Constitutional: Moderately built and nourished. Vitals:   06/12/16 0415 06/12/16 0430 06/12/16 0445 06/12/16 0500  BP: 170/86 171/78 169/86 169/86  Pulse: 83 83 88 86  Resp: 20 22 21 22   Temp:    97.9 F (36.6 C)  TempSrc:    Oral  SpO2: 99% 99% 97% 100%  Weight:      Height:       Eyes: Anicteric no pallor. ENMT: No discharge from the ears eyes nose and mouth. Neck: No mass felt. No neck rigidity. JVD elevated. Respiratory: No rhonchi or crepitations. Cardiovascular: S1 and S2 heard. No murmurs appreciated. Abdomen: Soft nontender bowel sounds present. Musculoskeletal: Bilateral lower extremity edema. Skin: Chronic skin changes. Neurologic: Alert awake oriented to time place and person. Most all activities. Psychiatric: Appears normal. Normal affect.   Labs on Admission: I have personally reviewed following labs and imaging studies  CBC:  Recent Labs Lab  06/12/16 0041  WBC 13.1*  NEUTROABS 12.0*  HGB 9.9*  HCT 33.0*  MCV 87.8  PLT 123456   Basic Metabolic Panel:  Recent Labs Lab 06/12/16 0041  NA 145  K 4.2  CL 115*  CO2 19*  GLUCOSE 203*  BUN 71*  CREATININE 6.06*  CALCIUM 7.1*   GFR: Estimated Creatinine Clearance: 8 mL/min (by C-G formula based on SCr of 6.06 mg/dL (H)). Liver Function Tests:  Recent Labs Lab 06/12/16 0041  AST 17  ALT 15  ALKPHOS 43  BILITOT 0.4  PROT 6.6  ALBUMIN 3.1*   No results for input(s): LIPASE, AMYLASE in the last 168 hours. No results for input(s): AMMONIA in the last 168 hours. Coagulation Profile: No results for input(s): INR, PROTIME in the last 168 hours. Cardiac Enzymes: No results for input(s): CKTOTAL, CKMB, CKMBINDEX, TROPONINI in the last 168 hours. BNP (last 3 results) No results for input(s): PROBNP in the last 8760 hours. HbA1C: No results for input(s): HGBA1C in the last 72 hours. CBG: No results for input(s): GLUCAP in the last 168 hours. Lipid Profile: No results for input(s): CHOL, HDL, LDLCALC, TRIG, CHOLHDL, LDLDIRECT in the last 72 hours. Thyroid Function Tests: No results for input(s): TSH, T4TOTAL, FREET4, T3FREE, THYROIDAB in the last 72 hours. Anemia Panel: No results for input(s): VITAMINB12, FOLATE, FERRITIN, TIBC, IRON, RETICCTPCT in the last 72 hours. Urine analysis:    Component Value Date/Time   COLORURINE YELLOW 02/20/2014 0455   APPEARANCEUR CLOUDY (A) 02/20/2014 0455   LABSPEC 1.021 02/20/2014 0455   PHURINE 6.0 02/20/2014 0455   GLUCOSEU 100 (A) 02/20/2014 0455   HGBUR NEGATIVE 02/20/2014 0455   BILIRUBINUR NEGATIVE 02/20/2014 0455   KETONESUR NEGATIVE 02/20/2014 0455   PROTEINUR 100 (A) 02/20/2014 0455   UROBILINOGEN 1.0 02/20/2014 0455   NITRITE NEGATIVE 02/20/2014 0455   LEUKOCYTESUR TRACE (A) 02/20/2014 0455   Sepsis Labs: @LABRCNTIP (procalcitonin:4,lacticidven:4) )No results found for this or any previous visit (from the past 240  hour(s)).   Radiological Exams on Admission: Dg Chest Portable 1 View  Result Date: 06/12/2016 CLINICAL DATA:  Central chest pain and shortness of breath. Cough and congestion. EXAM: PORTABLE CHEST 1 VIEW COMPARISON:  Radiograph 08/01/2015 FINDINGS: Mild cardiomegaly, unchanged. Right hilar prominence is stable. There is tortuosity of the thoracic aorta. Diffuse interstitial opacities suspicious for pulmonary edema. The previous right focal upper lobe opacity is not visualized, however multiple overlying artifacts in this region. Questionable blunting of costophrenic angles. The bones appear under mineralized. IMPRESSION: 1. Interstitial opacities suspicious for pulmonary edema. 2. Previous focal right upper lobe opacity is not visualized, however there are multiple overlying artifacts in this region. Recommend follow-up PA and lateral views when patient is able. Electronically Signed   By: Jeb Levering M.D.   On: 06/12/2016 01:09    EKG: Independently reviewed. Normal sinus rhythm with LVH and ST depression in lateral leads.  Assessment/Plan Principal Problem:   Acute pulmonary edema (HCC) Active Problems:   Acute systolic congestive heart failure, NYHA class 4 (HCC)   Hypertensive emergency   Metabolic acidosis   Chronic anemia   CKD (chronic kidney disease) stage 5, GFR less than 15 ml/min (HCC)    1. Acute pulmonary edema with progressive kidney disease - patient was given Lasix 120 mg IV in the ER. I have continued on 80 mg IV every 12. Closely follow intake output and metabolic panel. Last 2-D echo done in February was showing EF of 30-35%. Patient has refused dialysis and not keen on any intervention. Not on any ACE inhibitor or ARB due to renal failure. On nitroglycerin infusion. Wean off BiPAP once respiratory status improves. 2. Hypertensive emergency probably contributingto #1 - patient is on IV nitroglycerin infusion. Patient is also on Lasix. I have restarted on amlodipine and  Coreg and Imdur. Slowly wean off nitroglycerin infusion after starting oral medications. 3. Chronic kidney disease stage V- declines dialysis. 4. History of non-ST MI - cycle cardiac markers. 5. Chronic anemia secondary to ESRD -  follow CBC. 6. Metabolic acidosis secondary to renal failure - will need bicarbonate tablets.   DVT prophylaxis: Heparin. Code Status: DO NOT RESUSCITATE.  Family Communication: Unable to reach patient's son.  Disposition Plan: To be determined.  Consults called: Palliative care.  Admission status: Inpatient.    Rise Patience MD [facility] Pager [phone].  If 7PM-7AM, please contact night-coverage [email or web address] Password [email or web address]  06/12/2016, 5:06 AM

## 2016-06-12 NOTE — ED Notes (Signed)
Breakfast tray ordered- ty  

## 2016-06-12 NOTE — Progress Notes (Addendum)
Patient is resting comfortably with no respiratory distress noted. Patient is on room air with spo2 94%. BIPAP is not needed at this time.

## 2016-06-12 NOTE — ED Notes (Addendum)
Spoke with Dr Lovena Neighbours RE increasing troponin.  He stated no further tx needed.

## 2016-06-12 NOTE — ED Notes (Signed)
Dr. Hal Hope ( admitting MD ) notified on pt.'s elevated Troponin result  this morning .

## 2016-06-12 NOTE — ED Provider Notes (Signed)
[facility] DEPT Provider Note   CSN: [id number] Arrival date & time:     By signing my name below, I, [name], attest that this documentation has been prepared under the direction and in the presence of Forde Dandy, MD . Electronically Signed: Neta Jarvis, ED Scribe. 06/12/2016. 12:40 AM.   History   Chief Complaint Chief Complaint  Patient presents with  . Chest Pain  . Shortness of Breath    The history is provided by the patient. No language interpreter was used.   HPI Comments:  [name] is a 81 y.o. female with PMHx of CHF and COPD who presents to the Emergency Department via EMS due to chest pain and SOB that occurred PTA.  Pt reports associated cough. Per EMS, they were called out for chest pain. EMS states that they hear rales when they arrived. EMS reports that she had 78% SpO2 when FD arrived but increased to 97-98% en route. Pt was given 5mg  albuterol nebulizer, 4 doses NTG, 324mg  aspirin, and 125mg  solumedrol en route. EMS reports that pt's chest pain has improved from to 5/10 when it was 10/10 previously. BP was 203/111 en route. Pt denies any current chest pain.    Past Medical History:  Diagnosis Date  . Anemia   . Arthritis   . CAD (coronary artery disease)   . CAD (coronary artery disease)    a. Stents in [facility] 2001 and cath 2008 with possible balloon or stent at [facility] (not available in [place]).   . Cancer ([place])    scalp  . Chronic combined systolic and diastolic CHF (congestive heart failure) ([place])    a. EF 45-50% in 0000000. b. Mixed systolic/diastolic - EF A999333 by echo 2014, 35% by nuc.  . CKD (chronic kidney disease), stage IV ([place]) 08/29/2012  . COPD (chronic obstructive pulmonary disease) ([place])   . Depression   . Gastrointestinal hemorrhage    a. Pt reports she has h/o this many years ago, requiring 2 units of blood. Thinks it was in [place] but records not available. She does not know why this happened.  Marland Kitchen GERD  (gastroesophageal reflux disease)   . H/O medication noncompliance    a. Per notes, due to cost.   . Headache(784.0)   . Hemorrhoid    a. Mild hemorrhoidal bleeding per previous DC note.  Marland Kitchen Hx of peptic ulcer   . Hypertension   . Myocardial infarction   . Nephrolithiasis   . Osteoarthritis, generalized   . Stroke [facility])    a. 2008: acute stroke in left centrum ovale. 06/2010: hemorrhagic stroke.    Patient Active Problem List   Diagnosis Date Noted  . Pulmonary edema 06/12/2016  . Acute on chronic combined systolic and diastolic heart failure ([place])   . Community acquired pneumonia   . Palliative care encounter   . Advanced directives, counseling/discussion   . Encounter for hospice care discussion   . CAP (community acquired pneumonia) 08/01/2015  . Acute systolic congestive heart failure, NYHA class 4 ([place]) 02/22/2014  . NSTEMI (non-ST elevated myocardial infarction) ([place]) 02/22/2014  . 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, NYHA class 4 and CKD stage 4 ([place]) 02/22/2014  . CHF exacerbation ([place]) 02/20/2014  . Diastolic CHF, acute on chronic (HCC) 02/20/2014  . Chronic obstructive asthma, unspecified 09/13/2012  . Renal dysfunction- Gd 4 08/29/2012  . Hypokalemia 08/29/2012  . Acute exacerbation of CHF (congestive heart failure) ([place]) 08/25/2012  .  HYPERTENSION, BENIGN ESSENTIAL, LABILE 07/24/2010  . CAD 07/24/2010  . CEREBROVASCULAR ACCIDENT 07/24/2010  . GASTROINTESTINAL HEMORRHAGE 07/24/2010  . NEPHROLITHIASIS 07/24/2010  . OSTEOARTHRITIS, GENERALIZED 07/24/2010  . PEPTIC ULCER DISEASE, HX OF 07/24/2010    Past Surgical History:  Procedure Laterality Date  . ABDOMINAL HYSTERECTOMY    . APPENDECTOMY    . CHOLECYSTECTOMY    . COLON SURGERY    . COLONOSCOPY  07/18/10  . EYE SURGERY    . MELANOMA EXCISION  1989   distal right LE   . TONSILLECTOMY      OB History    No data available       Home Medications     Prior to Admission medications   Medication Sig Start Date End Date Taking? Authorizing Provider  amLODipine (NORVASC) 5 MG tablet Take 1 tablet (5 mg total) by mouth daily. 03/24/14   Darlin Coco, MD  aspirin 81 MG chewable tablet Chew 1 tablet (81 mg total) by mouth daily. 09/02/12   Neena Rhymes, MD  atorvastatin (LIPITOR) 20 MG tablet Take 1 tablet (20 mg total) by mouth daily at 6 PM. 04/04/14   Darlin Coco, MD  azithromycin (ZITHROMAX) 250 MG tablet Take 2 more days to finish the course 08/07/15   Ripudeep K Rai, MD  carvedilol (COREG) 25 MG tablet Take 1 tablet (25 mg total) by mouth 2 (two) times daily with a meal. 06/04/16   Fay Records, MD  cefUROXime (CEFTIN) 500 MG tablet Take 1 tablet (500 mg total) by mouth daily. X 2 more days to complete the course 08/07/15   Ripudeep K Rai, MD  isosorbide mononitrate (IMDUR) 60 MG 24 hr tablet Take 1 tablet (60 mg total) by mouth daily. 04/04/14   Darlin Coco, MD  nitroGLYCERIN (NITROSTAT) 0.4 MG SL tablet Place 1 tablet (0.4 mg total) under the tongue every 5 (five) minutes as needed for chest pain. 03/17/14   Darlin Coco, MD  oseltamivir (TAMIFLU) 30 MG capsule Take 1 capsule (30 mg total) by mouth daily. Take one more day to finish the course 08/07/15   Ripudeep K Rai, MD  oxyCODONE (ROXICODONE) 5 MG immediate release tablet Take 0.5-1 tablets (2.5-5 mg total) by mouth every 12 (twelve) hours as needed (shortness of breath). 08/07/15 08/06/16  Ripudeep Krystal Eaton, MD  ranolazine (RANEXA) 500 MG 12 hr tablet Take 1 tablet (500 mg total) by mouth 2 (two) times daily. 04/04/14   Darlin Coco, MD    Family History Family History  Problem Relation Age of Onset  . Diabetes Mother   . Hypertension Mother   . Diabetes Sister   . Cancer Son     breast  . Diabetes Sister     Social History Social History  Substance Use Topics  . Smoking status: Current Every Day Smoker    Types: Cigarettes  . Smokeless tobacco: Never Used   . Alcohol use No     Allergies   Patient has no known allergies.   Review of Systems Review of Systems 10 Systems reviewed and are negative for acute change except as noted in the HPI.   Physical Exam Updated Vital Signs BP 191/90 (BP Location: Left Arm)   Pulse 86   Temp 97.9 F (36.6 C) (Axillary)   Resp (!) 30   Ht 5\' 6"  (1.676 m)   Wt 175 lb (79.4 kg)   SpO2 99%   BMI 28.25 kg/m   Physical Exam Physical Exam  Nursing note and vitals  reviewed. Constitutional: Well developed, well nourished, non-toxic, and in no acute distress Head: Normocephalic and atraumatic.  Mouth/Throat: Oropharynx is clear and moist.  Neck: Normal range of motion. Neck supple.  Cardiovascular: Tachycardic rate and regular rhythm.   Pulmonary/Chest: Tachypnic with accessory muscle usage. Rhonchi throughout lower lung.  Abdominal: Soft. There is no tenderness. There is no rebound and no guarding.  Musculoskeletal: +1 bilateral lower extremity edema. Normal range of motion.  Neurological: Alert, no facial droop, fluent speech, moves all extremities symmetrically Skin: Skin is warm and dry.  Psychiatric: Cooperative   ED Treatments / Results  DIAGNOSTIC STUDIES:  Oxygen Saturation is 100% on RA, normal by my interpretation.    COORDINATION OF CARE:  12:41 AM Discussed treatment plan with pt at bedside and pt agreed to plan.   Labs (all labs ordered are listed, but only abnormal results are displayed) Labs Reviewed  CBC WITH DIFFERENTIAL/PLATELET - Abnormal; Notable for the following:       Result Value   WBC 13.1 (*)    RBC 3.76 (*)    Hemoglobin 9.9 (*)    HCT 33.0 (*)    RDW 17.9 (*)    Neutro Abs 12.0 (*)    Lymphs Abs 0.5 (*)    All other components within normal limits  COMPREHENSIVE METABOLIC PANEL - Abnormal; Notable for the following:    Chloride 115 (*)    CO2 19 (*)    Glucose, Bld 203 (*)    BUN 71 (*)    Creatinine, Ser 6.06 (*)    Calcium 7.1 (*)    Albumin  3.1 (*)    GFR calc non Af Amer 6 (*)    GFR calc Af Amer 7 (*)    All other components within normal limits  BRAIN NATRIURETIC PEPTIDE - Abnormal; Notable for the following:    B Natriuretic Peptide 2,232.6 (*)    All other components within normal limits  I-STAT ARTERIAL BLOOD GAS, ED - Abnormal; Notable for the following:    pH, Arterial 7.204 (*)    pCO2 arterial 48.8 (*)    Bicarbonate 19.4 (*)    Acid-base deficit 8.0 (*)    All other components within normal limits  I-STAT TROPOININ, ED    EKG  EKG Interpretation  Date/Time:  Wednesday June 12 2016 00:43:15 EST Ventricular Rate:  97 PR Interval:    QRS Duration: 130 QT Interval:  411 QTC Calculation: 523 R Axis:   58 Text Interpretation:  Sinus rhythm LVH with secondary repolarization abnormality ST depression, consider ischemia, diffuse lds Prolonged QT interval inferolateral depressions similar to later EKg although more pronounced Confirmed by Angelisa Winthrop MD, Elvert Cumpton [phone]) on 06/12/2016 12:47:19 AM       Radiology Dg Chest Portable 1 View  Result Date: 06/12/2016 CLINICAL DATA:  Central chest pain and shortness of breath. Cough and congestion. EXAM: PORTABLE CHEST 1 VIEW COMPARISON:  Radiograph 08/01/2015 FINDINGS: Mild cardiomegaly, unchanged. Right hilar prominence is stable. There is tortuosity of the thoracic aorta. Diffuse interstitial opacities suspicious for pulmonary edema. The previous right focal upper lobe opacity is not visualized, however multiple overlying artifacts in this region. Questionable blunting of costophrenic angles. The bones appear under mineralized. IMPRESSION: 1. Interstitial opacities suspicious for pulmonary edema. 2. Previous focal right upper lobe opacity is not visualized, however there are multiple overlying artifacts in this region. Recommend follow-up PA and lateral views when patient is able. Electronically Signed   By: Jeb Levering M.D.   On:  06/12/2016 01:09     Procedures Procedures (including critical care time) CRITICAL CARE Performed by: Forde Dandy   Total critical care time: 45 minutes  Critical care time was exclusive of separately billable procedures and treating other patients.  Critical care was necessary to treat or prevent imminent or life-threatening deterioration.  Critical care was time spent personally by me on the following activities: development of treatment plan with patient and/or surrogate as well as nursing, discussions with consultants, evaluation of patient's response to treatment, examination of patient, obtaining history from patient or surrogate, ordering and performing treatments and interventions, ordering and review of laboratory studies, ordering and review of radiographic studies, pulse oximetry and re-evaluation of patient's condition.  Medications Ordered in ED Medications  nitroGLYCERIN (NITROSTAT) SL tablet 0.4 mg (0.4 mg Sublingual Given 06/12/16 0045)  furosemide (LASIX) injection 80 mg (not administered)  nitroGLYCERIN 50 mg in dextrose 5 % 250 mL (0.2 mg/mL) infusion (100 mcg/min Intravenous New Bag/Given 06/12/16 0106)  furosemide (LASIX) injection 40 mg (40 mg Intravenous Given 06/12/16 0207)     Initial Impression / Assessment and Plan / ED Course  I have reviewed the triage vital signs and the nursing notes.  Pertinent labs & imaging results that were available during my care of the patient were reviewed by me and considered in my medical decision making (see chart for details).  Clinical Course    Prehospital EKG visualized by Dr. Fransico Him  Presenting with acute hypoxic respiratory failure likely in the setting of pulmonary edema. This very hypertensive on arrival with systolic blood pressures in the 200s. Difficult fluid overloaded on exam with a rail and rhonchi on lung exam. Chest x-ray visualized suggestive of pulmonary edema. Given lasix and started on nitroglycerin gtt. Discussed  with Dr. Hal Hope, who will admit to stepdown.   Also of care discussed with patient, she states she had this time is DO NOT RESUSCITATE.  Final Clinical Impressions(s) / ED Diagnoses   Final diagnoses:  Acute pulmonary edema ([place])  Hypertensive emergency    New Prescriptions New Prescriptions   No medications on file   I personally performed the services described in this documentation, which was scribed in my presence. The recorded information has been reviewed and is accurate.     Forde Dandy, MD 06/12/16 [phone]

## 2016-06-12 NOTE — ED Notes (Signed)
Attempted report 

## 2016-06-12 NOTE — Progress Notes (Signed)
PROGRESS NOTE        PATIENT DETAILS Name: [name] Age: 81 y.o. Sex: female Date of Birth: 05/10/1935 Admit Date: 06/12/2016 Admitting Physician Rise Patience, MD PCP:SUN,VYVYAN Y, MD  Brief Narrative: Patient is a 81 y.o. female history of chronic kidney disease stage V, chronic systolic heart failure sent to the ED on 12/27 with acute on chronic hypoxemic respiratory failure secondary to pulmonary edema, in a setting of worsening renal function. See below for further details  Subjective: No longer on BiPAP. Appears comfortable on nasal cannula.  Assessment/Plan: Principal Problem: Acute on chronic hypoxemic respiratory failure: Suspect secondary to acute systolic heart failure. Initially on BiPAP, she has now been transitioned to nasal cannula. Continue Lasix. Note-on home O2 when necessary.  Acute systolic heart failure: In a setting of worsening renal function, increase Lasix to 120 mg twice a day. Follow weights/intake output. Long discussion with patient, she is not keen on pursuing any further investigations at this point. Follow.  Non-STEMI vs Demand ischemia: Troponin significantly elevated-long discussion with patient today, she does not desire to pursue any further investigations at this point. This is consistent with what she has resided in the past as well. Will continue to medically manage with aspirin, statin and beta blocker.  Hypertensive emergency: Titrate off nitroglycerin infusion-resume amlodipine, Coreg, Imdur  Chronic kidney disease stage V: Continues to decline dialysis-we will maximize diuretics. Follow.  Metabolic acidosis: Probably secondary to CKD-appears mild-follow for now.  Anemia: Likely secondary to chronic kidney disease-follow.  Tobacco abuse: Has no intention of quitting  Goals of care/palliative care: Frail 81 year old female with known history of stage V chronic kidney disease, chronic systolic heart  failure-claims that she was with hospice still June 2017-presented with acute hypoxemic respiratory failure due to pulmonary edema in a setting of worsening renal function. She continues to decline hemodialysis and aggressive care-long discussion with patient earlier this morning in the emergency room-DO NOT RESUSCITATE confirmed, she would like to be managed with gentle medical treatment and discharged home in the next few days. Palliative care has been consulted.  DVT Prophylaxis: Prophylactic Heparin   Code Status:  DNR  Family Communication: None at bedside-patient did not meet contacting her son.  Disposition Plan: Remain inpatient-but will plan on Home hospice vs SNF with hospice on discharge  Antimicrobial agents: None  Procedures: None  CONSULTS:  None  Time spent: 25- minutes-Greater than 50% of this time was spent in counseling, explanation of diagnosis, planning of further management, and coordination of care.  MEDICATIONS: Anti-infectives    None      Scheduled Meds: . [START ON 06/13/2016] amLODipine  10 mg Oral Daily  . aspirin  81 mg Oral Daily  . aspirin EC  81 mg Oral Daily  . atorvastatin  20 mg Oral q1800  . carvedilol  25 mg Oral BID WC  . furosemide  120 mg Intravenous Q8H  . heparin  5,000 Units Subcutaneous Q8H  . ipratropium-albuterol  3 mL Nebulization Q6H  . isosorbide mononitrate  60 mg Oral Daily  . methylPREDNISolone (SOLU-MEDROL) injection  60 mg Intravenous Q8H  . nitroGLYCERIN      . ranolazine  500 mg Oral BID   Continuous Infusions: PRN Meds:.acetaminophen **OR** acetaminophen, albuterol, hydrALAZINE, ondansetron **OR** ondansetron (ZOFRAN) IV   PHYSICAL EXAM: Vital signs: Vitals:   06/12/16 1245 06/12/16  1300 06/12/16 1315 06/12/16 1356  BP: 110/92 138/88 141/69 (!) 158/60  Pulse: 84 85 75   Resp: 21 23 23    Temp:      TempSrc:      SpO2: 99% 100% 99% 98%  Weight:      Height:       Filed Weights   06/12/16 0034    Weight: 79.4 kg (175 lb)   Body mass index is 28.25 kg/m.   General appearance :Awake, alert, not in any distress. Chronically ill appearing. Looks very frail. Eyes:, pupils equally reactive to light and accomodation,no scleral icterus. HEENT: Atraumatic and Normocephalic Neck: supple, no JVD.  Resp:Good air entry bilaterally, bibasilar rales CVS: S1 S2 regular GI: Bowel sounds present, Non tender and not distended with no gaurding, rigidity or rebound.No organomegaly Extremities: B/L Lower Ext shows + edema, both legs are warm to touch Neurology:  speech clear,Non focal, sensation is grossly intact-but is generally weak Psychiatric: Alert and oriented x 3. Normal mood. Musculoskeletal:.No digital cyanosis Skin:No Rash, warm and dry Wounds:N/A  I have personally reviewed following labs and imaging studies  LABORATORY DATA: CBC:  Recent Labs Lab 06/12/16 0041 06/12/16 0533  WBC 13.1* 10.3  NEUTROABS 12.0* 10.0*  HGB 9.9* 8.7*  HCT 33.0* 29.4*  MCV 87.8 88.3  PLT 266 [id number]    Basic Metabolic Panel:  Recent Labs Lab 06/12/16 0041 06/12/16 0533  NA 145 144  K 4.2 4.7  CL 115* 116*  CO2 19* 18*  GLUCOSE 203* 150*  BUN 71* 74*  CREATININE 6.06* 5.77*  CALCIUM 7.1* 6.9*    GFR: Estimated Creatinine Clearance: 8.4 mL/min (by C-G formula based on SCr of 5.77 mg/dL (H)).  Liver Function Tests:  Recent Labs Lab 06/12/16 0041 06/12/16 0533  AST 17 19  ALT 15 14  ALKPHOS 43 38  BILITOT 0.4 0.2*  PROT 6.6 6.2*  ALBUMIN 3.1* 2.8*   No results for input(s): LIPASE, AMYLASE in the last 168 hours. No results for input(s): AMMONIA in the last 168 hours.  Coagulation Profile: No results for input(s): INR, PROTIME in the last 168 hours.  Cardiac Enzymes:  Recent Labs Lab 06/12/16 0533 06/12/16 1158  TROPONINI 0.80* 1.28*    BNP (last 3 results) No results for input(s): PROBNP in the last 8760 hours.  HbA1C: No results for input(s): HGBA1C in the last  72 hours.  CBG: No results for input(s): GLUCAP in the last 168 hours.  Lipid Profile: No results for input(s): CHOL, HDL, LDLCALC, TRIG, CHOLHDL, LDLDIRECT in the last 72 hours.  Thyroid Function Tests:  Recent Labs  06/12/16 0533  TSH 0.724    Anemia Panel: No results for input(s): VITAMINB12, FOLATE, FERRITIN, TIBC, IRON, RETICCTPCT in the last 72 hours.  Urine analysis:    Component Value Date/Time   COLORURINE YELLOW 02/20/2014 0455   APPEARANCEUR CLOUDY (A) 02/20/2014 0455   LABSPEC 1.021 02/20/2014 0455   PHURINE 6.0 02/20/2014 0455   GLUCOSEU 100 (A) 02/20/2014 0455   HGBUR NEGATIVE 02/20/2014 0455   BILIRUBINUR NEGATIVE 02/20/2014 0455   KETONESUR NEGATIVE 02/20/2014 0455   PROTEINUR 100 (A) 02/20/2014 0455   UROBILINOGEN 1.0 02/20/2014 0455   NITRITE NEGATIVE 02/20/2014 0455   LEUKOCYTESUR TRACE (A) 02/20/2014 0455    Sepsis Labs: Lactic Acid, Venous No results found for: LATICACIDVEN  MICROBIOLOGY: No results found for this or any previous visit (from the past 240 hour(s)).  RADIOLOGY STUDIES/RESULTS: Dg Chest Portable 1 View  Result Date: 06/12/2016 CLINICAL  DATA:  Central chest pain and shortness of breath. Cough and congestion. EXAM: PORTABLE CHEST 1 VIEW COMPARISON:  Radiograph 08/01/2015 FINDINGS: Mild cardiomegaly, unchanged. Right hilar prominence is stable. There is tortuosity of the thoracic aorta. Diffuse interstitial opacities suspicious for pulmonary edema. The previous right focal upper lobe opacity is not visualized, however multiple overlying artifacts in this region. Questionable blunting of costophrenic angles. The bones appear under mineralized. IMPRESSION: 1. Interstitial opacities suspicious for pulmonary edema. 2. Previous focal right upper lobe opacity is not visualized, however there are multiple overlying artifacts in this region. Recommend follow-up PA and lateral views when patient is able. Electronically Signed   By: Jeb Levering M.D.   On: 06/12/2016 01:09     LOS: 0 days   Oren Binet, MD  [facility] Pager:[phone]  If 7PM-7AM, please contact night-coverage [email or web address] Password [email or web address] 06/12/2016, 2:06 PM

## 2016-06-12 NOTE — Consult Note (Signed)
Consultation Note Date: 06/12/2016   Patient Name: [name]  DOB: 08/20/1934  MRN: [id number]  Age / Sex: 81 y.o., female  PCP: Donald Prose, MD Referring Physician: Jonetta Osgood, MD  Reason for Consultation: Disposition and Establishing goals of care  HPI/Patient Profile: 81 y.o. female  with past medical history of CKD stage V, mixed heart failure, hx of both hemorrhagic and ischemic strokes, COPD, hypertension, and NSTEMI in February who was admitted on 06/12/2016 with progressive shortness of breath. Work-up revealed hypertensive emergency, acute pulmonary edema, worsened kidney function, and metabolic acidosis. She reported that she had stopped taking her medications for a few days. She had previously been on Hospice, however had declined their services in June after pursuing care for a hip fracture.  Clinical Assessment and Goals of Care: I met [name] at her bedside. She is a feisty woman that was a [employer] to talk with. We talked about how things had been going prior to admission, what was happening now, and her expectations for the future.   Prior to admission she reported that things had been going well overall. After she elected to pursue management of her hip fracture in June, she has been off Hospice and living at home with her son. She reports that she takes care of her own personal needs, including cooking, cleaning, and house organizing. Her son provides transportation support. Her biggest frustration was obtaining her medications. Hospice had been writing for her medications and she had been getting them from her local pharmacy. Once Hospice was stopped, her prescriptions expired at the pharmacy and she wasn't sure who could write them for her. She tried contacting the pharmacy and prior doctors, but had been unable to find someone to prescribe them for her. She had enough medication to last until the end of November, at which  point she entirely ran out.   She presented to the hospital after her shortness of breath became so severe she thought she was suffocating. She was very hesitant about coming to the hospital as she was tired of people telling her what she needed, and feels "alive and fine" at home. Thankfully, since admission her symptoms have markedly improved and she has no acute complaints. Of note, she does not want overly aggressive interventions, such as dialysis, heart catheterizations, or "overly long x-rays." She is comfortable with medical management and BiPAP.  In terms of the future, [name] is very clear that she wants to be at home without help. We talked about resuming Hospice, and she adamantly declines their support. She reports that she has had multiple family members who have used their services, and she does not feel she would benefit from what they do. When she stopped their services in June she felt nothing changed, other than "running out of medications, and no one around to take care of it." She has independently scheduled a new patient appointment with Dr. Dorris Carnes (cardiology at [facility]), however does not have a primary care provider.   She did not want me to call any family members to update them or touch base.   Primary Decision Maker PATIENT   SUMMARY OF RECOMMENDATIONS    DNR/DNI, limit interventions to medications and BiPAP as she does not want anything overly aggressive or invasive  On discharge, plan for home (she refused home health and Hospice). She will need a primary care provider set up for her, which I have asked CM to help facilitate. Please  also provide prescriptions for all of her medications, with a duration up to the PCP office visit  Code Status/Advance Care Planning:  DNR  Symptom Management:   Pt reports slight SOB, which she says is chronic and nothing I should do anything about  Additional Recommendations (Limitations, Scope,  Preferences):  No Hemodialysis and No Surgical Procedures  Psycho-social/Spiritual:   Desire for further Chaplaincy support:no  Additional Recommendations: Caregiving  Support/Resources and establish care wtih PCP  Prognosis:   < 6 months  Discharge Planning: Home; pt refused any further services at home.       Primary Diagnoses: Present on Admission: . Acute pulmonary edema (HCC) . Hypertensive emergency . Metabolic acidosis . Acute systolic congestive heart failure, NYHA class 4 ([place]) . Chronic anemia . CKD (chronic kidney disease) stage 5, GFR less than 15 ml/min (HCC) . Acute respiratory failure ([place])   I have reviewed the medical record, interviewed the patient and family, and examined the patient. The following aspects are pertinent.  Past Medical History:  Diagnosis Date  . Anemia   . Arthritis   . CAD (coronary artery disease)   . CAD (coronary artery disease)    a. Stents in [facility] 2001 and cath 2008 with possible balloon or stent at [facility] (not available in [place]).   . Cancer ([place])    scalp  . Chronic combined systolic and diastolic CHF (congestive heart failure) ([place])    a. EF 45-50% in 9563. b. Mixed systolic/diastolic - EF 87-56% by echo 2014, 35% by nuc.  . CKD (chronic kidney disease), stage IV ([place]) 08/29/2012  . COPD (chronic obstructive pulmonary disease) ([place])   . Depression   . Gastrointestinal hemorrhage    a. Pt reports she has h/o this many years ago, requiring 2 units of blood. Thinks it was in [place] but records not available. She does not know why this happened.  Marland Kitchen GERD (gastroesophageal reflux disease)   . H/O medication noncompliance    a. Per notes, due to cost.   . Headache(784.0)   . Hemorrhoid    a. Mild hemorrhoidal bleeding per previous DC note.  Marland Kitchen Hx of peptic ulcer   . Hypertension   . Myocardial infarction   . Nephrolithiasis   . Osteoarthritis, generalized   . Stroke [facility])    a. 2008: acute stroke in left  centrum ovale. 06/2010: hemorrhagic stroke.   Social History   Social History  . Marital status: Divorced    Spouse name: N/A  . Number of children: N/A  . Years of education: N/A   Social History Main Topics  . Smoking status: Current Every Day Smoker    Types: Cigarettes  . Smokeless tobacco: Never Used  . Alcohol use No  . Drug use: No  . Sexual activity: Not Asked   Other Topics Concern  . None   Social History Narrative   Graduate [employer]   Married '59-30 yrs/divorced   2 sons, '61 '62; 8 grandchildren   Works as Optometrist, Field seismologist   Retired-lives in her own home and her son lives with her.    Family History  Problem Relation Age of Onset  . Diabetes Mother   . Hypertension Mother   . Diabetes Sister   . Cancer Son     breast  . Diabetes Sister    Scheduled Meds: . [START ON 06/13/2016] amLODipine  10 mg Oral Daily  . aspirin  81 mg Oral Daily  . aspirin EC  81 mg  Oral Daily  . atorvastatin  20 mg Oral q1800  . carvedilol  25 mg Oral BID WC  . heparin  5,000 Units Subcutaneous Q8H  . ipratropium-albuterol  3 mL Nebulization Q6H  . isosorbide mononitrate  60 mg Oral Daily  . methylPREDNISolone (SOLU-MEDROL) injection  60 mg Intravenous Q8H  . nitroGLYCERIN      . ranolazine  500 mg Oral BID   Continuous Infusions: . furosemide     PRN Meds:.acetaminophen **OR** acetaminophen, albuterol, hydrALAZINE, ondansetron **OR** ondansetron (ZOFRAN) IV No Known Allergies   Review of Systems  Constitutional: Positive for activity change, appetite change, fatigue and unexpected weight change (30lb weight loss over 6 months).  HENT: Positive for congestion and hearing loss. Negative for sinus pressure, sneezing, sore throat, trouble swallowing and voice change.   Eyes: Negative for visual disturbance.  Respiratory: Positive for cough and shortness of breath. Negative for chest tightness and wheezing.   Cardiovascular: Positive for leg swelling.  Negative for chest pain.  Gastrointestinal: Negative for abdominal distention, abdominal pain, constipation (BM 12/26 per her report), diarrhea, nausea and vomiting.  Genitourinary: Negative for difficulty urinating, dysuria and flank pain.  Musculoskeletal: Positive for gait problem. Negative for back pain.  Skin: Positive for pallor.  Neurological: Positive for weakness. Negative for speech difficulty, light-headedness and headaches.  Hematological: Bruises/bleeds easily.  Psychiatric/Behavioral: Negative for confusion, decreased concentration and sleep disturbance. The patient is not nervous/anxious.    Physical Exam  Constitutional: She is oriented to person, place, and time. She appears well-developed. She has a sickly appearance. Nasal cannula in place.  Eyes: EOM are normal.  Neck: Normal range of motion.  Cardiovascular: Normal rate.   Pulmonary/Chest: She has decreased breath sounds in the right lower field and the left lower field.  Increased WOB with prolonged conversation. Frequent productive hacking cough.  Abdominal: Soft. Bowel sounds are normal. She exhibits no distension.  Neurological: She is alert and oriented to person, place, and time.  Skin: Skin is warm and dry. Bruising noted. There is pallor.  Psychiatric: She has a normal mood and affect. Her speech is normal and behavior is normal. Judgment and thought content normal. Cognition and memory are normal.    Vital Signs: BP 110/92   Pulse 84   Temp 97.9 F (36.6 C) (Oral)   Resp 21   Ht '5\' 6"'$  (1.676 m)   Wt 79.4 kg (175 lb)   SpO2 99%   BMI 28.25 kg/m  Pain Assessment: No/denies pain   Pain Score: 0-No pain   SpO2: SpO2: 99 % O2 Device:SpO2: 99 % O2 Flow Rate: .   IO: Intake/output summary:  Intake/Output Summary (Last 24 hours) at 06/12/16 1319 Last data filed at 06/12/16 0457  Gross per 24 hour  Intake                0 ml  Output              200 ml  Net             -200 ml    LBM:   Baseline  Weight: Weight: 79.4 kg (175 lb) Most recent weight: Weight: 79.4 kg (175 lb)     Palliative Assessment/Data:  PPS 70-80%    Time In: 1400 Time Out: 1450 Time Total: 50 minutes Greater than 50%  of this time was spent counseling and coordinating care related to the above assessment and plan.  Signed by: Charlynn Court, NP Palliative Medicine Team Pager #  [phone] (M-F 7a-5p) Team Phone # [phone] (Nights/Weekends)

## 2016-06-12 NOTE — ED Triage Notes (Signed)
Patient arrived with EMS from home reports central chest pain  With SOB and chest congestion/cough onset this evening , she received 4 NTG sl / 324 mg ASA , Albuterol 5 mg nebulizer and Solumedrol 125 mg IV with relief . Pt. arrived on a CPAP by EMS.

## 2016-06-12 NOTE — Progress Notes (Signed)
Seen and examined in the ED Now off BiPAP-on [place]-appears comfortable Long d/w patient-DNR reconfirmed-does not desire Dialysis or any other procedures including cardiac cath Will stop IV NTG-transition to oral anti-hypertensives and prn IV Hydralazine Does not require SDU-will admit to tele Does not want me to call and d/w her son-she will do herself when he comes to visit her Full note to follow.

## 2016-06-13 DIAGNOSIS — E872 Acidosis: Secondary | ICD-10-CM

## 2016-06-13 DIAGNOSIS — J9601 Acute respiratory failure with hypoxia: Secondary | ICD-10-CM

## 2016-06-13 LAB — BASIC METABOLIC PANEL
Anion gap: 11 (ref 5–15)
BUN: 83 mg/dL — ABNORMAL HIGH (ref 6–20)
CHLORIDE: 109 mmol/L (ref 101–111)
CO2: 19 mmol/L — AB (ref 22–32)
CREATININE: 5.66 mg/dL — AB (ref 0.44–1.00)
Calcium: 6.8 mg/dL — ABNORMAL LOW (ref 8.9–10.3)
GFR calc non Af Amer: 6 mL/min — ABNORMAL LOW (ref >60)
GFR, EST AFRICAN AMERICAN: 7 mL/min — AB (ref >60)
GLUCOSE: 101 mg/dL — AB (ref 65–99)
Potassium: 3.5 mmol/L (ref 3.5–5.1)
Sodium: 139 mmol/L (ref 135–145)

## 2016-06-13 NOTE — Evaluation (Signed)
Physical Therapy Evaluation Patient Details Name: [name] MRN: [id number] DOB: Jan 27, 1935 Today's Date: 06/13/2016   History of Present Illness  [name] is a 81 y.o. female with history of chronic kidney disease stage V, hypertension, non-ST elevation MI in February of this year presents to the ER because of worsening shortness of breath over the last 2-3 days. Pt also with lice.  Clinical Impression  Pt admitted with above diagnosis. Pt currently with functional limitations due to the deficits listed below (see PT Problem List). Pt set in her ways. Pt more steady with RW but pt refusing to use it at home.  Pt will benefit from skilled PT to increase their independence and safety with mobility to allow discharge to the venue listed below.       Follow Up Recommendations No PT follow up;Supervision/Assistance - 24 hour (pt adamantly refusing HHPT)    Equipment Recommendations  None recommended by PT    Recommendations for Other Services       Precautions / Restrictions Precautions Precautions: Fall Restrictions Weight Bearing Restrictions: No      Mobility  Bed Mobility               General bed mobility comments: pt up in chair upon PT arrival  Transfers Overall transfer level: Needs assistance Equipment used: None Transfers: Sit to/from Stand Sit to Stand: Supervision         General transfer comment: pt with good technique, pushed up from chair adnd reached back  Ambulation/Gait Ambulation/Gait assistance: Min guard Ambulation Distance (Feet): 60 Feet Assistive device: Rolling walker (2 wheeled) Gait Pattern/deviations: Step-through pattern;Decreased stride length Gait velocity: slow   General Gait Details: pt steady with RW, when not using RW pt reaching for bed and coutners  Stairs            Wheelchair Mobility    Modified Rankin (Stroke Patients Only)       Balance Overall balance assessment: Needs assistance            Standing balance-Leahy Scale: Fair                               Pertinent Vitals/Pain Pain Assessment: No/denies pain    Home Living Family/patient expects to be discharged to:: Private residence Living Arrangements: Children;Other relatives Available Help at Discharge: Family;Available 24 hours/day Type of Home: House Home Access: Level entry     Home Layout: One level Home Equipment: Bedside commode;Walker - 2 wheels;Shower seat Additional Comments: states she is alone sometimes    Prior Function Level of Independence: Independent         Comments: pt furniture walks, has walker but doesnt use it, doesn't drive     Hand Dominance   Dominant Hand: Right    Extremity/Trunk Assessment   Upper Extremity Assessment Upper Extremity Assessment: Generalized weakness    Lower Extremity Assessment Lower Extremity Assessment: Generalized weakness    Cervical / Trunk Assessment Cervical / Trunk Assessment: Normal  Communication   Communication: HOH  Cognition Arousal/Alertness: Awake/alert Behavior During Therapy: WFL for tasks assessed/performed Overall Cognitive Status: Impaired/Different from baseline Area of Impairment: Safety/judgement         Safety/Judgement: Decreased awareness of safety;Decreased awareness of deficits     General Comments: pt refusing use of RW despite being more stabile and decreased risk of falling    General Comments      Exercises  Assessment/Plan    PT Assessment Patient needs continued PT services  PT Problem List Decreased strength;Decreased activity tolerance;Decreased balance;Decreased mobility          PT Treatment Interventions DME instruction;Gait training;Stair training;Functional mobility training;Therapeutic activities;Therapeutic exercise;Balance training    PT Goals (Current goals can be found in the Care Plan section)  Acute Rehab PT Goals Patient Stated Goal: home today PT Goal  Formulation: With patient Time For Goal Achievement: 06/20/16 Potential to Achieve Goals: Good Additional Goals Additional Goal #1: Pt to score >19 on DGI to indicate minimal falls risk.    Frequency Min 2X/week   Barriers to discharge Decreased caregiver support alone sometimes    Co-evaluation               End of Session Equipment Utilized During Treatment: Gait belt Activity Tolerance: Patient tolerated treatment well Patient left: in chair;with call bell/phone within reach Nurse Communication: Mobility status         Time: [id number] PT Time Calculation (min) (ACUTE ONLY): 20 min   Charges:   PT Evaluation $PT Eval Low Complexity: 1 Procedure     PT G Codes:        Tami Barren M Mikko Lewellen 06/13/2016, 3:00 PM   Kittie Plater, PT, DPT Pager #: [phone] Office #: [phone]

## 2016-06-13 NOTE — Progress Notes (Signed)
PROGRESS NOTE        PATIENT DETAILS Name: [name] Age: 81 y.o. Sex: female Date of Birth: 1934-11-06 Admit Date: 06/12/2016 Admitting Physician Rise Patience, MD PCP:SUN,VYVYAN Y, MD  Brief Narrative: Patient is a 81 y.o. female history of chronic kidney disease stage V, chronic systolic heart failure sent to the ED on 12/27 with acute on chronic hypoxemic respiratory failure secondary to pulmonary edema, in a setting of worsening renal function. See below for further details  Subjective: Breathing much better-wants to go home  Assessment/Plan: Principal Problem: Acute on chronic hypoxemic respiratory failure: Suspect secondary to acute systolic heart failure. Initially on BiPAP, she has now been transitioned to nasal cannula. Continue Lasix. Note-on home O2 when necessary.  Acute systolic heart failure: In a setting of worsening renal function, continue Lasix to 120 mg twice a day. Follow weights/intake output (output not recorded-but weight is down considerably). Long discussion with patient again today, she is not keen on pursuing any further investigations at this point. Follow.  Non-STEMI vs Demand ischemia: Troponin significantly elevated-long discussion with patient today, she does not desire to pursue any further investigations at this point. This is consistent with what she has desired in the past as well. Will continue to medically manage with aspirin, statin and beta blocker.  Hypertensive emergency: Initially required Nitroglycerin infusion-now titrated-BP controlled with amlodipine, Coreg, Imdur  Chronic kidney disease stage V: Continues to decline dialysis-we will maximize diuretics. Creatinine stable and close to baseline.Follow.  Metabolic acidosis: Probably secondary to CKD-appears mild-follow for now.  Anemia: Likely secondary to chronic kidney disease-follow.  Lice infestation:given permethrin lotion 12/27  Tobacco  abuse: Has no intention of quitting  Goals of care/palliative care: Frail 81 year old female with known history of stage V chronic kidney disease, chronic systolic heart failure-claims that she was with hospice still June 2017-presented with acute hypoxemic respiratory failure due to pulmonary edema in a setting of worsening renal function. She continues to decline hemodialysis and aggressive care-long discussion with patient -DO NOT RESUSCITATE confirmed, she would like to be managed with gentle medical treatment and discharged home 12/29. Palliative care consult appreciated  DVT Prophylaxis: Prophylactic Heparin   Code Status:  DNR  Family Communication: None at bedside-patient did not meet contacting her son.  Disposition Plan: Remain inpatient-home tomorrow  Antimicrobial agents: None  Procedures: None  CONSULTS:  None  Time spent: 25- minutes-Greater than 50% of this time was spent in counseling, explanation of diagnosis, planning of further management, and coordination of care.  MEDICATIONS: Anti-infectives    None      Scheduled Meds: . amLODipine  10 mg Oral Daily  . aspirin  81 mg Oral Daily  . aspirin EC  81 mg Oral Daily  . atorvastatin  20 mg Oral q1800  . carvedilol  25 mg Oral BID WC  . feeding supplement (NEPRO CARB STEADY)  237 mL Oral BID BM  . furosemide  120 mg Intravenous Q8H  . heparin  5,000 Units Subcutaneous Q8H  . ipratropium-albuterol  3 mL Nebulization TID  . isosorbide mononitrate  60 mg Oral Daily  . ranolazine  500 mg Oral BID   Continuous Infusions: PRN Meds:.acetaminophen **OR** acetaminophen, albuterol, hydrALAZINE, ondansetron **OR** ondansetron (ZOFRAN) IV   PHYSICAL EXAM: Vital signs: Vitals:   06/12/16 1935 06/12/16 2237 06/13/16 0441 06/13/16 0854  BP:  Marland Kitchen)  139/57 (!) 153/64   Pulse:  72 69   Resp:  20 20   Temp:  98.3 F (36.8 C) 97.5 F (36.4 C)   TempSrc:  Oral Oral   SpO2: 94% 93% 94% 92%  Weight:   72.8 kg (160  lb 8 oz)   Height:       Filed Weights   06/12/16 0034 06/13/16 0441  Weight: 79.4 kg (175 lb) 72.8 kg (160 lb 8 oz)   Body mass index is 25.91 kg/m.   General appearance :Awake, alert, not in any distress. Chronically ill appearing. Looks very frail. Eyes:, pupils equally reactive to light and accomodation,no scleral icterus. HEENT: Atraumatic and Normocephalic Neck: supple, no JVD.  Resp:Good air entry bilaterally, few bibasilar rales CVS: S1 S2 regular GI: Bowel sounds present, Non tender and not distended with no gaurding, rigidity or rebound.No organomegaly Extremities: B/L Lower Ext shows + edema, both legs are warm to touch Neurology:  speech clear,Non focal, sensation is grossly intact-but is generally weak Psychiatric: Alert and oriented x 3. Normal mood. Musculoskeletal:.No digital cyanosis Skin:No Rash, warm and dry Wounds:N/A  I have personally reviewed following labs and imaging studies  LABORATORY DATA: CBC:  Recent Labs Lab 06/12/16 0041 06/12/16 0533  WBC 13.1* 10.3  NEUTROABS 12.0* 10.0*  HGB 9.9* 8.7*  HCT 33.0* 29.4*  MCV 87.8 88.3  PLT 266 [id number]    Basic Metabolic Panel:  Recent Labs Lab 06/12/16 0041 06/12/16 0533 06/13/16 0725  NA 145 144 139  K 4.2 4.7 3.5  CL 115* 116* 109  CO2 19* 18* 19*  GLUCOSE 203* 150* 101*  BUN 71* 74* 83*  CREATININE 6.06* 5.77* 5.66*  CALCIUM 7.1* 6.9* 6.8*    GFR: Estimated Creatinine Clearance: 8.2 mL/min (by C-G formula based on SCr of 5.66 mg/dL (H)).  Liver Function Tests:  Recent Labs Lab 06/12/16 0041 06/12/16 0533  AST 17 19  ALT 15 14  ALKPHOS 43 38  BILITOT 0.4 0.2*  PROT 6.6 6.2*  ALBUMIN 3.1* 2.8*   No results for input(s): LIPASE, AMYLASE in the last 168 hours. No results for input(s): AMMONIA in the last 168 hours.  Coagulation Profile: No results for input(s): INR, PROTIME in the last 168 hours.  Cardiac Enzymes:  Recent Labs Lab 06/12/16 0533 06/12/16 1158  06/12/16 1627  TROPONINI 0.80* 1.28* 1.35*    BNP (last 3 results) No results for input(s): PROBNP in the last 8760 hours.  HbA1C: No results for input(s): HGBA1C in the last 72 hours.  CBG: No results for input(s): GLUCAP in the last 168 hours.  Lipid Profile: No results for input(s): CHOL, HDL, LDLCALC, TRIG, CHOLHDL, LDLDIRECT in the last 72 hours.  Thyroid Function Tests:  Recent Labs  06/12/16 0533  TSH 0.724    Anemia Panel: No results for input(s): VITAMINB12, FOLATE, FERRITIN, TIBC, IRON, RETICCTPCT in the last 72 hours.  Urine analysis:    Component Value Date/Time   COLORURINE YELLOW 02/20/2014 0455   APPEARANCEUR CLOUDY (A) 02/20/2014 0455   LABSPEC 1.021 02/20/2014 0455   PHURINE 6.0 02/20/2014 0455   GLUCOSEU 100 (A) 02/20/2014 0455   HGBUR NEGATIVE 02/20/2014 0455   BILIRUBINUR NEGATIVE 02/20/2014 0455   KETONESUR NEGATIVE 02/20/2014 0455   PROTEINUR 100 (A) 02/20/2014 0455   UROBILINOGEN 1.0 02/20/2014 0455   NITRITE NEGATIVE 02/20/2014 0455   LEUKOCYTESUR TRACE (A) 02/20/2014 0455    Sepsis Labs: Lactic Acid, Venous No results found for: LATICACIDVEN  MICROBIOLOGY: No results found  for this or any previous visit (from the past 240 hour(s)).  RADIOLOGY STUDIES/RESULTS: Dg Chest Portable 1 View  Result Date: 06/12/2016 CLINICAL DATA:  Central chest pain and shortness of breath. Cough and congestion. EXAM: PORTABLE CHEST 1 VIEW COMPARISON:  Radiograph 08/01/2015 FINDINGS: Mild cardiomegaly, unchanged. Right hilar prominence is stable. There is tortuosity of the thoracic aorta. Diffuse interstitial opacities suspicious for pulmonary edema. The previous right focal upper lobe opacity is not visualized, however multiple overlying artifacts in this region. Questionable blunting of costophrenic angles. The bones appear under mineralized. IMPRESSION: 1. Interstitial opacities suspicious for pulmonary edema. 2. Previous focal right upper lobe opacity is  not visualized, however there are multiple overlying artifacts in this region. Recommend follow-up PA and lateral views when patient is able. Electronically Signed   By: Jeb Levering M.D.   On: 06/12/2016 01:09     LOS: 1 day   Oren Binet, MD  [facility] Pager:[phone]  If 7PM-7AM, please contact night-coverage [email or web address] Password [facility] 06/13/2016, 9:39 AM

## 2016-06-13 NOTE — Progress Notes (Signed)
Daily Progress Note   Patient Name: [name]       Date: 06/13/2016 DOB: 03-Apr-1935  Age: 81 y.o. MRN#: [id number] Attending Physician: Jonetta Osgood, MD Primary Care Physician: Lynne Logan, MD Admit Date: 06/12/2016  Reason for Consultation/Follow-up: Disposition and Establishing goals of care  Subjective: [name] remains quite feisty today. She feels she is back at her breathing baseline, with the only difference being a "nagging spit cough." She is eager for discharge home, but relates that it will likely be tomorrow. Her only complaint is frequent urination and the bed alarm--she is fiercely independent and does not appreciate being "tethered to the equipment."  Length of Stay: 1  Current Medications: Scheduled Meds:  . amLODipine  10 mg Oral Daily  . aspirin  81 mg Oral Daily  . aspirin EC  81 mg Oral Daily  . atorvastatin  20 mg Oral q1800  . carvedilol  25 mg Oral BID WC  . feeding supplement (NEPRO CARB STEADY)  237 mL Oral BID BM  . furosemide  120 mg Intravenous Q8H  . heparin  5,000 Units Subcutaneous Q8H  . ipratropium-albuterol  3 mL Nebulization TID  . isosorbide mononitrate  60 mg Oral Daily  . ranolazine  500 mg Oral BID    Continuous Infusions:   PRN Meds: acetaminophen **OR** acetaminophen, albuterol, hydrALAZINE, ondansetron **OR** ondansetron (ZOFRAN) IV  Physical Exam        Constitutional: She is oriented to person, place, and time. She appears well-developed. She has a sickly appearance.  Eyes: EOM are normal.  Neck: Normal range of motion.  Cardiovascular: Normal rate.   Pulmonary/Chest: She has decreased breath sounds in the right lower field and the left lower field. Frequent productive hacking cough.  Abdominal: Soft. Bowel sounds are normal. She exhibits no distension.    Neurological: She is alert and oriented to person, place, and time.  Skin: Skin is warm and dry. Bruising noted. There is pallor.  Psychiatric: She has a normal mood and affect. Her speech is normal and behavior is normal. Judgment and thought content normal. Cognition and memory are normal.    Vital Signs: BP (!) 153/64 (BP Location: Right Arm)   Pulse 69   Temp 97.5 F (36.4 C) (Oral)   Resp 20   Ht 5\' 6"  (1.676 m)   Wt 72.8 kg (160 lb 8 oz)   SpO2 94%   BMI 25.91 kg/m  SpO2: SpO2: 94 % O2 Device: O2 Device: Not Delivered O2 Flow Rate: O2 Flow Rate (L/min): 2 L/min  Intake/output summary: No intake or output data in the 24 hours ending 06/13/16 0840 LBM: Last BM Date: 06/11/16 Baseline Weight: Weight: 79.4 kg (175 lb) Most recent weight: Weight: 72.8 kg (160 lb 8 oz)       Palliative Assessment/Data: PPS 70-80%     Patient Active Problem List   Diagnosis Date Noted  . Acute pulmonary edema ([place]) 06/12/2016  . Hypertensive emergency 06/12/2016  . Metabolic acidosis [id number]  . Chronic anemia 06/12/2016  . CKD (chronic kidney disease) stage 5, GFR less than 15 ml/min (H[facility]) 06/12/2016  . Acute respiratory failure ([place]) 06/12/2016  . Goals of care, counseling/discussion   .  Palliative care by specialist   . Acute on chronic combined systolic and diastolic heart failure ([place])   . Community acquired pneumonia   . Palliative care encounter   . Advanced directives, counseling/discussion   . Encounter for hospice care discussion   . CAP (community acquired pneumonia) 08/01/2015  . Acute systolic congestive heart failure, NYHA class 4 ([place]) 02/22/2014  . NSTEMI (non-ST elevated myocardial infarction) ([place]) 02/22/2014  . 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 CHF exacerbation ([place]) 02/20/2014  . Diastolic CHF, acute on chronic (HCC) 02/20/2014  . Chronic obstructive  asthma, unspecified 09/13/2012  . Renal dysfunction- Gd 4 08/29/2012  . Hypokalemia 08/29/2012  . Acute exacerbation of CHF (congestive heart failure) ([place]) 08/25/2012  . HYPERTENSION, BENIGN ESSENTIAL, LABILE 07/24/2010  . CAD 07/24/2010  . CEREBROVASCULAR ACCIDENT 07/24/2010  . GASTROINTESTINAL HEMORRHAGE 07/24/2010  . NEPHROLITHIASIS 07/24/2010  . OSTEOARTHRITIS, GENERALIZED 07/24/2010  . PEPTIC ULCER DISEASE, HX OF 07/24/2010    Palliative Care Assessment & Plan   HPI: 81 y.o. female  with past medical history of CKD stage V, mixed heart failure, hx of both hemorrhagic and ischemic strokes, COPD, hypertension, and NSTEMI in February who was admitted on 06/12/2016 with progressive shortness of breath. Work-up revealed hypertensive emergency, acute pulmonary edema, worsened kidney function, and metabolic acidosis. She reported that she had stopped taking her medications for a few days. She had previously been on Hospice, however had declined their services in June after pursuing care for a hip fracture.  Assessment: In my initial consult on 12/27 [name] explained that she ran out of her medications around the end of the November. She had been with Hospice until June--who had been writing for her medications--but she declined their services after deciding to pursue treatment for a hip fracture, and had been unable to identify a provider to prescribe her medications after that. She used the medication she had left until she ran out. In our discussion 12/27 and today she was very clear on not wanting Hospice support, or any home health.   Today, we talked at length about the importance of having a plan for obtaining her medications and managing acute issues that present. She does have an appointment with Dr. Harrington Challenger (cardiology) at the end of January to establish care, however I explained that Dr. Harrington Challenger would not manage her non-cardiology issues, which include both chronic and future acute  issues. She had seen Dr. Nancy Fetter in the past, who she likes, however she refused to allow [place] to set up an appointment for her. She states she wants to see her cardiologist first, and will contact Dr. Nancy Fetter after that appointment, "if I want to." Her plan is to present to urgent care or "call Dr. Lynnda Child office if I need, and see whoever is free."  Despite my strong urging and explanation on the importance of an established PCP, she continued to refuse.   Recommendations/Plan:  DNR/DNI, limit interventions to medications and BiPAP as she does not want anything overly aggressive or invasive  On discharge, plan for home (she refused home health and Hospice). She has refused to have a PCP appointment made  At discharge, please provide prescriptions for all of her medications, with a duration up to the cardiology visit  Code Status:  DNR  Prognosis:   < 6 months  Discharge Planning:  Home; pt refused any further services at home.  Care plan was discussed with pt  Thank you for allowing the Palliative Medicine Team to assist in the care of this patient.   Time In: 0830 Time Out: 0900 Total Time 30 minutes Prolonged Time Billed  no       Greater than 50%  of this time was spent counseling and coordinating care related to the above assessment and plan.  Charlynn Court, NP Palliative Medicine Team [phone] pager (7a-5p) Team Phone # [phone]

## 2016-06-14 MED ORDER — CARVEDILOL 25 MG PO TABS: 25.0000 mg | ORAL_TABLET | Freq: Two times a day (BID) | ORAL | 0 refills | Status: DC

## 2016-06-14 MED ORDER — ATORVASTATIN CALCIUM 20 MG PO TABS: 20.0000 mg | ORAL_TABLET | Freq: Every day | ORAL | 0 refills | Status: DC

## 2016-06-14 MED ORDER — RANOLAZINE ER 500 MG PO TB12: 500.0000 mg | ORAL_TABLET | Freq: Two times a day (BID) | ORAL | 0 refills | Status: DC

## 2016-06-14 MED ORDER — NITROGLYCERIN 0.4 MG SL SUBL: 0.4000 mg | SUBLINGUAL_TABLET | SUBLINGUAL | 99 refills | Status: DC | PRN

## 2016-06-14 MED ORDER — NEPRO/CARBSTEADY PO LIQD: 237.0000 mL | Freq: Two times a day (BID) | ORAL | 0 refills | Status: DC

## 2016-06-14 MED ORDER — TORSEMIDE 20 MG PO TABS: 80.0000 mg | ORAL_TABLET | Freq: Every day | ORAL | 0 refills | Status: DC

## 2016-06-14 MED ORDER — AMLODIPINE BESYLATE 10 MG PO TABS: 10.0000 mg | ORAL_TABLET | Freq: Every day | ORAL | 0 refills | Status: DC

## 2016-06-14 MED ORDER — ISOSORBIDE MONONITRATE ER 60 MG PO TB24: 60.0000 mg | ORAL_TABLET | Freq: Every day | ORAL | 0 refills | Status: DC

## 2016-06-14 NOTE — Discharge Summary (Signed)
PATIENT DETAILS Name: [name] Age: 81 y.o. Sex: female Date of Birth: 1935/04/27 MRN: [id number]. Admitting Physician: Rise Patience, MD PCP:SUN,VYVYAN Y, MD  Admit Date: 06/12/2016 Discharge date: 06/14/2016  Recommendations for Outpatient Follow-up:  1. Follow up with PCP in 1-2 weeks 2. Please obtain BMP/CBC in one week 3. Patient will need initiation of hospice service in the near future when she is more acceptable. 4. She is a DO NOT RESUSCITATE, desires only gentle medical treatment.  Admitted From:  Home  Disposition: Home (refused home health services or hospice services)   Home Health: No  Equipment/Devices: None  Discharge Condition: Stable  CODE STATUS: FULL CODE  Diet recommendation:  Heart Healthy   Brief Summary: Patient is a 81 y.o. female history of chronic kidney disease stage V, chronic systolic heart failure sent to the ED on 12/27 with acute on chronic hypoxemic respiratory failure secondary to pulmonary edema, in a setting of worsening renal function. See below for further details  Brief Hospital Course: Acute on chronic hypoxemic respiratory failure: Suspect secondary to acute systolic heart failure. Initially on BiPAP, she was transitioned to nasal cannula-and now is on room air.Was on IV Lasix-will transition to [facility] on discharge. Note-on home O2 when necessary.  Acute systolic heart failure: Now compensated with aggressive diuresis. In a setting of worsening renal function. Follow weights/intake output (output not recorded-but weight is down considerably). Long discussion with patient again today, she is not keen on pursuing any further investigations at this point. We will transition to [facility] on discharge.  Non-STEMI vs Demand ischemia: Troponin significantly elevated-long discussion with patient multiple times during this hospital stay, she does not desire to pursue any further investigations at this point. This is  consistent with what she has desired in the past as well. Will continue to medically manage with aspirin, statin and beta blocker.  Hypertensive emergency: Initially required Nitroglycerin infusion-now titrated-BP controlled with amlodipine, Coreg, Imdur. Follow up with PCP for further optimization.  Chronic kidney disease stage V: Continues to decline dialysis-we will maximize diuretics. Creatinine stable and close to baseline.Follow closely in the outpatient setting.   Metabolic acidosis: Probably secondary to CKD-appears mild-follow for now.  Anemia: Likely secondary to chronic kidney disease-follow.  Lice infestation:given permethrin lotion 12/27  Tobacco abuse: Has no intention of quitting  Goals of care/palliative care: Frail 81 year old female with known history of stage V chronic kidney disease, chronic systolic heart failure-claims that she was with hospice still June 2017-presented with acute hypoxemic respiratory failure due to pulmonary edema in a setting of worsening renal function. She continues to decline hemodialysis and aggressive care-long discussion with patient -DO NOT RESUSCITATE confirmed, she would like to be managed with gentle medical treatment and discharged home 12/29.  Patient also declined home health or hospice services and just wants to go home with her son. Palliative care will follow patient during this hospital stay.   Procedures/Studies: None  Discharge Diagnoses:  Principal Problem:   Acute pulmonary edema (HCC) Active Problems:   Acute systolic congestive heart failure, NYHA class 4 (HCC)   Hypertensive emergency   Metabolic acidosis   Chronic anemia   CKD (chronic kidney disease) stage 5, GFR less than 15 ml/min (HCC)   Acute respiratory failure (HCC)   Goals of care, counseling/discussion   Palliative care by specialist   Discharge Instructions:  Activity:  As tolerated with Full fall precautions use walker/cane & assistance as  needed  Discharge Instructions    (HEART  FAILURE PATIENTS) Call MD:  Anytime you have any of the following symptoms: 1) 3 pound weight gain in 24 hours or 5 pounds in 1 week 2) shortness of breath, with or without a dry hacking cough 3) swelling in the hands, feet or stomach 4) if you have to sleep on extra pillows at night in order to breathe.    Complete by:  As directed    Diet - low sodium heart healthy    Complete by:  As directed    Increase activity slowly    Complete by:  As directed      Allergies as of 06/14/2016   No Known Allergies     Medication List    STOP taking these medications   oxyCODONE 5 MG immediate release tablet Commonly known as:  ROXICODONE     TAKE these medications   amLODipine 10 MG tablet Commonly known as:  NORVASC Take 1 tablet (10 mg total) by mouth daily. What changed:  medication strength  how much to take   aspirin 81 MG chewable tablet Chew 1 tablet (81 mg total) by mouth daily. What changed:  when to take this   atorvastatin 20 MG tablet Commonly known as:  LIPITOR Take 1 tablet (20 mg total) by mouth daily at 6 PM.   carvedilol 25 MG tablet Commonly known as:  COREG Take 1 tablet (25 mg total) by mouth 2 (two) times daily with a meal.   feeding supplement (NEPRO CARB STEADY) Liqd Take 237 mLs by mouth 2 (two) times daily between meals.   isosorbide mononitrate 60 MG 24 hr tablet Commonly known as:  IMDUR Take 1 tablet (60 mg total) by mouth daily.   nitroGLYCERIN 0.4 MG SL tablet Commonly known as:  NITROSTAT Place 1 tablet (0.4 mg total) under the tongue every 5 (five) minutes as needed for chest pain.   ranolazine 500 MG 12 hr tablet Commonly known as:  RANEXA Take 1 tablet (500 mg total) by mouth 2 (two) times daily.   torsemide 20 MG tablet Commonly known as:  DEMADEX Take 4 tablets (80 mg total) by mouth daily.      Follow-up Information    Lynne Logan, MD Follow up on 06/24/2016.   Specialty:  Family  Medicine Why:  appointment at 2:15- if pt does not want to keep appointment - please call and cancel Contact information: [place] [address] [phone]          No Known Allergies   Consultations:   Palliative care   Other Procedures/Studies: Dg Chest Portable 1 View  Result Date: 06/12/2016 CLINICAL DATA:  Central chest pain and shortness of breath. Cough and congestion. EXAM: PORTABLE CHEST 1 VIEW COMPARISON:  Radiograph 08/01/2015 FINDINGS: Mild cardiomegaly, unchanged. Right hilar prominence is stable. There is tortuosity of the thoracic aorta. Diffuse interstitial opacities suspicious for pulmonary edema. The previous right focal upper lobe opacity is not visualized, however multiple overlying artifacts in this region. Questionable blunting of costophrenic angles. The bones appear under mineralized. IMPRESSION: 1. Interstitial opacities suspicious for pulmonary edema. 2. Previous focal right upper lobe opacity is not visualized, however there are multiple overlying artifacts in this region. Recommend follow-up PA and lateral views when patient is able. Electronically Signed   By: Jeb Levering M.D.   On: 06/12/2016 01:09     TODAY-DAY OF DISCHARGE:  Subjective:   [name] today has no headache,no chest abdominal pain,no new weakness tingling or numbness,  feels much better wants to go home today.   Objective:   Blood pressure (!) 150/64, pulse 60, temperature 97.7 F (36.5 C), temperature source Oral, resp. rate 18, height 5\' 6"  (1.676 m), weight 72.8 kg (160 lb 6.4 oz), SpO2 95 %.  Intake/Output Summary (Last 24 hours) at 06/14/16 [id number] Last data filed at 06/13/16 1253  Gross per 24 hour  Intake              480 ml  Output                0 ml  Net              480 ml   Filed Weights   06/12/16 0034 06/13/16 0441 06/14/16 0530  Weight: 79.4 kg (175 lb) 72.8 kg (160 lb 8 oz) 72.8 kg (160 lb 6.4 oz)    Exam: Awake  Alert, Oriented *3, No new F.N deficits, Normal affect [place].AT,PERRAL Supple Neck,No JVD, No cervical lymphadenopathy appriciated.  Symmetrical Chest wall movement, Good air movement bilaterally, CTAB RRR,No Gallops,Rubs or new Murmurs, No Parasternal Heave +ve B.Sounds, Abd Soft, Non tender, No organomegaly appriciated, No rebound -guarding or rigidity. No Cyanosis, Clubbing or edema, No new Rash or bruise   PERTINENT RADIOLOGIC STUDIES: Dg Chest Portable 1 View  Result Date: 06/12/2016 CLINICAL DATA:  Central chest pain and shortness of breath. Cough and congestion. EXAM: PORTABLE CHEST 1 VIEW COMPARISON:  Radiograph 08/01/2015 FINDINGS: Mild cardiomegaly, unchanged. Right hilar prominence is stable. There is tortuosity of the thoracic aorta. Diffuse interstitial opacities suspicious for pulmonary edema. The previous right focal upper lobe opacity is not visualized, however multiple overlying artifacts in this region. Questionable blunting of costophrenic angles. The bones appear under mineralized. IMPRESSION: 1. Interstitial opacities suspicious for pulmonary edema. 2. Previous focal right upper lobe opacity is not visualized, however there are multiple overlying artifacts in this region. Recommend follow-up PA and lateral views when patient is able. Electronically Signed   By: Jeb Levering M.D.   On: 06/12/2016 01:09     PERTINENT LAB RESULTS: CBC:  Recent Labs  06/12/16 0041 06/12/16 0533  WBC 13.1* 10.3  HGB 9.9* 8.7*  HCT 33.0* 29.4*  PLT 266 275   CMET CMP     Component Value Date/Time   NA 139 06/13/2016 0725   K 3.5 06/13/2016 0725   CL 109 06/13/2016 0725   CO2 19 (L) 06/13/2016 0725   GLUCOSE 101 (H) 06/13/2016 0725   BUN 83 (H) 06/13/2016 0725   CREATININE 5.66 (H) 06/13/2016 0725   CALCIUM 6.8 (L) 06/13/2016 0725   PROT 6.2 (L) 06/12/2016 0533   ALBUMIN 2.8 (L) 06/12/2016 0533   AST 19 06/12/2016 0533   ALT 14 06/12/2016 0533   ALKPHOS 38 06/12/2016 0533    BILITOT 0.2 (L) 06/12/2016 0533   GFRNONAA 6 (L) 06/13/2016 0725   GFRAA 7 (L) 06/13/2016 0725    GFR Estimated Creatinine Clearance: 8.2 mL/min (by C-G formula based on SCr of 5.66 mg/dL (H)). No results for input(s): LIPASE, AMYLASE in the last 72 hours.  Recent Labs  06/12/16 0533 06/12/16 1158 06/12/16 1627  TROPONINI 0.80* 1.28* 1.35*   Invalid input(s): POCBNP No results for input(s): DDIMER in the last 72 hours. No results for input(s): HGBA1C in the last 72 hours. No results for input(s): CHOL, HDL, LDLCALC, TRIG, CHOLHDL, LDLDIRECT in the last 72 hours.  Recent Labs  06/12/16 0533  TSH 0.724   No results for input(s):  VITAMINB12, FOLATE, FERRITIN, TIBC, IRON, RETICCTPCT in the last 72 hours. Coags: No results for input(s): INR in the last 72 hours.  Invalid input(s): PT Microbiology: No results found for this or any previous visit (from the past 240 hour(s)).  FURTHER DISCHARGE INSTRUCTIONS:  Get Medicines reviewed and adjusted: Please take all your medications with you for your next visit with your Primary MD  Laboratory/radiological data: Please request your Primary MD to go over all hospital tests and procedure/radiological results at the follow up, please ask your Primary MD to get all Hospital records sent to his/her office.  In some cases, they will be blood work, cultures and biopsy results pending at the time of your discharge. Please request that your primary care M.D. goes through all the records of your hospital data and follows up on these results.  Also Note the following: If you experience worsening of your admission symptoms, develop shortness of breath, life threatening emergency, suicidal or homicidal thoughts you must seek medical attention immediately by calling 911 or calling your MD immediately  if symptoms less severe.  You must read complete instructions/literature along with all the possible adverse reactions/side effects for all the  Medicines you take and that have been prescribed to you. Take any new Medicines after you have completely understood and accpet all the possible adverse reactions/side effects.   Do not drive when taking Pain medications or sleeping medications (Benzodaizepines)  Do not take more than prescribed Pain, Sleep and Anxiety Medications. It is not advisable to combine anxiety,sleep and pain medications without talking with your primary care practitioner  Special Instructions: If you have smoked or chewed Tobacco  in the last 2 yrs please stop smoking, stop any regular Alcohol  and or any Recreational drug use.  Wear Seat belts while driving.  Please note: You were cared for by a hospitalist during your hospital stay. Once you are discharged, your primary care physician will handle any further medical issues. Please note that NO REFILLS for any discharge medications will be authorized once you are discharged, as it is imperative that you return to your primary care physician (or establish a relationship with a primary care physician if you do not have one) for your post hospital discharge needs so that they can reassess your need for medications and monitor your lab values.  Total Time spent coordinating discharge including counseling, education and face to face time equals 45 minutes.  SignedOren Binet 06/14/2016 8:22 AM

## 2016-07-08 ENCOUNTER — Ambulatory Visit: Payer: Medicare Other | Admitting: Internal Medicine

## 2016-07-31 ENCOUNTER — Encounter (HOSPITAL_COMMUNITY): Payer: Self-pay | Admitting: Emergency Medicine

## 2016-07-31 ENCOUNTER — Emergency Department (HOSPITAL_COMMUNITY): Payer: Medicare Other

## 2016-07-31 ENCOUNTER — Inpatient Hospital Stay (HOSPITAL_COMMUNITY)
Admission: EM | Admit: 2016-07-31 | Discharge: 2016-08-04 | DRG: 193 | Disposition: A | Discharge status: Hospice | Payer: Medicare Other | Attending: Internal Medicine | Admitting: Internal Medicine

## 2016-07-31 DIAGNOSIS — Z9981 Dependence on supplemental oxygen: Secondary | ICD-10-CM

## 2016-07-31 DIAGNOSIS — I5042 Chronic combined systolic (congestive) and diastolic (congestive) heart failure: Secondary | ICD-10-CM | POA: Diagnosis present

## 2016-07-31 DIAGNOSIS — N185 Chronic kidney disease, stage 5: Secondary | ICD-10-CM | POA: Diagnosis not present

## 2016-07-31 DIAGNOSIS — E1122 Type 2 diabetes mellitus with diabetic chronic kidney disease: Secondary | ICD-10-CM

## 2016-07-31 DIAGNOSIS — I252 Old myocardial infarction: Secondary | ICD-10-CM

## 2016-07-31 DIAGNOSIS — Z79899 Other long term (current) drug therapy: Secondary | ICD-10-CM

## 2016-07-31 DIAGNOSIS — J9601 Acute respiratory failure with hypoxia: Secondary | ICD-10-CM | POA: Diagnosis present

## 2016-07-31 DIAGNOSIS — N390 Urinary tract infection, site not specified: Secondary | ICD-10-CM | POA: Diagnosis present

## 2016-07-31 DIAGNOSIS — J9600 Acute respiratory failure, unspecified whether with hypoxia or hypercapnia: Secondary | ICD-10-CM | POA: Diagnosis present

## 2016-07-31 DIAGNOSIS — F17210 Nicotine dependence, cigarettes, uncomplicated: Secondary | ICD-10-CM | POA: Diagnosis present

## 2016-07-31 DIAGNOSIS — Z8249 Family history of ischemic heart disease and other diseases of the circulatory system: Secondary | ICD-10-CM

## 2016-07-31 DIAGNOSIS — N3000 Acute cystitis without hematuria: Secondary | ICD-10-CM

## 2016-07-31 DIAGNOSIS — I10 Essential (primary) hypertension: Secondary | ICD-10-CM | POA: Diagnosis not present

## 2016-07-31 DIAGNOSIS — Z90710 Acquired absence of both cervix and uterus: Secondary | ICD-10-CM

## 2016-07-31 DIAGNOSIS — Z515 Encounter for palliative care: Secondary | ICD-10-CM | POA: Diagnosis present

## 2016-07-31 DIAGNOSIS — J440 Chronic obstructive pulmonary disease with acute lower respiratory infection: Secondary | ICD-10-CM | POA: Diagnosis present

## 2016-07-31 DIAGNOSIS — N189 Chronic kidney disease, unspecified: Secondary | ICD-10-CM

## 2016-07-31 DIAGNOSIS — Z66 Do not resuscitate: Secondary | ICD-10-CM | POA: Diagnosis present

## 2016-07-31 DIAGNOSIS — Z8673 Personal history of transient ischemic attack (TIA), and cerebral infarction without residual deficits: Secondary | ICD-10-CM

## 2016-07-31 DIAGNOSIS — J189 Pneumonia, unspecified organism: Secondary | ICD-10-CM | POA: Diagnosis not present

## 2016-07-31 DIAGNOSIS — K219 Gastro-esophageal reflux disease without esophagitis: Secondary | ICD-10-CM | POA: Diagnosis present

## 2016-07-31 DIAGNOSIS — Z85820 Personal history of malignant melanoma of skin: Secondary | ICD-10-CM

## 2016-07-31 DIAGNOSIS — Z7982 Long term (current) use of aspirin: Secondary | ICD-10-CM

## 2016-07-31 DIAGNOSIS — Y95 Nosocomial condition: Secondary | ICD-10-CM | POA: Diagnosis present

## 2016-07-31 DIAGNOSIS — E875 Hyperkalemia: Secondary | ICD-10-CM | POA: Diagnosis present

## 2016-07-31 DIAGNOSIS — R7989 Other specified abnormal findings of blood chemistry: Secondary | ICD-10-CM

## 2016-07-31 DIAGNOSIS — I2510 Atherosclerotic heart disease of native coronary artery without angina pectoris: Secondary | ICD-10-CM | POA: Diagnosis present

## 2016-07-31 DIAGNOSIS — I714 Abdominal aortic aneurysm, without rupture: Secondary | ICD-10-CM | POA: Diagnosis present

## 2016-07-31 DIAGNOSIS — E440 Moderate protein-calorie malnutrition: Secondary | ICD-10-CM | POA: Diagnosis present

## 2016-07-31 DIAGNOSIS — I132 Hypertensive heart and chronic kidney disease with heart failure and with stage 5 chronic kidney disease, or end stage renal disease: Secondary | ICD-10-CM | POA: Diagnosis present

## 2016-07-31 DIAGNOSIS — Z993 Dependence on wheelchair: Secondary | ICD-10-CM

## 2016-07-31 DIAGNOSIS — E785 Hyperlipidemia, unspecified: Secondary | ICD-10-CM | POA: Diagnosis present

## 2016-07-31 DIAGNOSIS — R05 Cough: Secondary | ICD-10-CM | POA: Diagnosis not present

## 2016-07-31 DIAGNOSIS — R918 Other nonspecific abnormal finding of lung field: Secondary | ICD-10-CM | POA: Diagnosis present

## 2016-07-31 DIAGNOSIS — Z6826 Body mass index (BMI) 26.0-26.9, adult: Secondary | ICD-10-CM

## 2016-07-31 DIAGNOSIS — Z87442 Personal history of urinary calculi: Secondary | ICD-10-CM

## 2016-07-31 DIAGNOSIS — Z9049 Acquired absence of other specified parts of digestive tract: Secondary | ICD-10-CM

## 2016-07-31 DIAGNOSIS — Z801 Family history of malignant neoplasm of trachea, bronchus and lung: Secondary | ICD-10-CM

## 2016-07-31 DIAGNOSIS — B9620 Unspecified Escherichia coli [E. coli] as the cause of diseases classified elsewhere: Secondary | ICD-10-CM | POA: Diagnosis present

## 2016-07-31 DIAGNOSIS — Z833 Family history of diabetes mellitus: Secondary | ICD-10-CM

## 2016-07-31 LAB — URINALYSIS, ROUTINE W REFLEX MICROSCOPIC
Bilirubin Urine: NEGATIVE
GLUCOSE, UA: 50 mg/dL — AB
KETONES UR: NEGATIVE mg/dL
NITRITE: NEGATIVE
PH: 5 (ref 5.0–8.0)
Protein, ur: 30 mg/dL — AB
Specific Gravity, Urine: 1.011 (ref 1.005–1.030)

## 2016-07-31 LAB — COMPREHENSIVE METABOLIC PANEL
ALT: 11 U/L — AB (ref 14–54)
AST: 13 U/L — AB (ref 15–41)
Albumin: 3 g/dL — ABNORMAL LOW (ref 3.5–5.0)
Alkaline Phosphatase: 62 U/L (ref 38–126)
Anion gap: 14 (ref 5–15)
BILIRUBIN TOTAL: 0.9 mg/dL (ref 0.3–1.2)
BUN: 97 mg/dL — AB (ref 6–20)
CALCIUM: 6.8 mg/dL — AB (ref 8.9–10.3)
CO2: 20 mmol/L — ABNORMAL LOW (ref 22–32)
CREATININE: 7.1 mg/dL — AB (ref 0.44–1.00)
Chloride: 100 mmol/L — ABNORMAL LOW (ref 101–111)
GFR, EST AFRICAN AMERICAN: 6 mL/min — AB (ref >60)
GFR, EST NON AFRICAN AMERICAN: 5 mL/min — AB (ref >60)
Glucose, Bld: 147 mg/dL — ABNORMAL HIGH (ref 65–99)
Potassium: 4.2 mmol/L (ref 3.5–5.1)
Sodium: 134 mmol/L — ABNORMAL LOW (ref 135–145)
TOTAL PROTEIN: 6.9 g/dL (ref 6.5–8.1)

## 2016-07-31 LAB — CBC WITH DIFFERENTIAL/PLATELET
BASOS ABS: 0 10*3/uL (ref 0.0–0.1)
BASOS PCT: 0 %
EOS ABS: 0.1 10*3/uL (ref 0.0–0.7)
EOS PCT: 1 %
HCT: 32 % — ABNORMAL LOW (ref 36.0–46.0)
Hemoglobin: 10.3 g/dL — ABNORMAL LOW (ref 12.0–15.0)
LYMPHS ABS: 0.7 10*3/uL (ref 0.7–4.0)
Lymphocytes Relative: 6 %
MCH: 26.9 pg (ref 26.0–34.0)
MCHC: 32.2 g/dL (ref 30.0–36.0)
MCV: 83.6 fL (ref 78.0–100.0)
Monocytes Absolute: 0.7 10*3/uL (ref 0.1–1.0)
Monocytes Relative: 6 %
NEUTROS PCT: 87 %
Neutro Abs: 10 10*3/uL — ABNORMAL HIGH (ref 1.7–7.7)
PLATELETS: 341 10*3/uL (ref 150–400)
RBC: 3.83 MIL/uL — AB (ref 3.87–5.11)
RDW: 16.1 % — ABNORMAL HIGH (ref 11.5–15.5)
WBC: 11.6 10*3/uL — AB (ref 4.0–10.5)

## 2016-07-31 LAB — CBG MONITORING, ED: Glucose-Capillary: 131 mg/dL — ABNORMAL HIGH (ref 65–99)

## 2016-07-31 LAB — I-STAT CHEM 8, ED
BUN: 94 mg/dL — AB (ref 6–20)
CALCIUM ION: 0.86 mmol/L — AB (ref 1.15–1.40)
Chloride: 101 mmol/L (ref 101–111)
Creatinine, Ser: 7.5 mg/dL — ABNORMAL HIGH (ref 0.44–1.00)
Glucose, Bld: 143 mg/dL — ABNORMAL HIGH (ref 65–99)
HCT: 33 % — ABNORMAL LOW (ref 36.0–46.0)
HEMOGLOBIN: 11.2 g/dL — AB (ref 12.0–15.0)
Potassium: 4.2 mmol/L (ref 3.5–5.1)
SODIUM: 137 mmol/L (ref 135–145)
TCO2: 22 mmol/L (ref 0–100)

## 2016-07-31 LAB — I-STAT CG4 LACTIC ACID, ED: Lactic Acid, Venous: 0.62 mmol/L (ref 0.5–1.9)

## 2016-07-31 MED ORDER — CEFEPIME HCL 1 G IJ SOLR
1.0000 g | Freq: Once | INTRAMUSCULAR | Status: AC
  Administered 2016-07-31: 1 g via INTRAVENOUS
  Filled 2016-07-31: qty 1

## 2016-07-31 MED ORDER — SODIUM CHLORIDE 0.9 % IV BOLUS (SEPSIS)
1000.0000 mL | Freq: Once | INTRAVENOUS | Status: AC
  Administered 2016-07-31: 1000 mL via INTRAVENOUS

## 2016-07-31 MED ORDER — DEXTROSE 50 % IV SOLN
1.0000 | Freq: Once | INTRAVENOUS | Status: DC
  Filled 2016-07-31: qty 50

## 2016-07-31 MED ORDER — INSULIN ASPART 100 UNIT/ML ~~LOC~~ SOLN
5.0000 [IU] | Freq: Once | SUBCUTANEOUS | Status: DC
  Filled 2016-07-31: qty 1

## 2016-07-31 MED ORDER — VANCOMYCIN HCL IN DEXTROSE 1-5 GM/200ML-% IV SOLN
1000.0000 mg | Freq: Once | INTRAVENOUS | Status: AC
  Administered 2016-07-31: 1000 mg via INTRAVENOUS
  Filled 2016-07-31: qty 200

## 2016-07-31 MED ORDER — CALCIUM GLUCONATE 10 % IV SOLN
1.0000 g | Freq: Once | INTRAVENOUS | Status: DC
  Filled 2016-07-31: qty 10

## 2016-07-31 NOTE — ED Notes (Signed)
Patient transported to CT 

## 2016-07-31 NOTE — ED Notes (Addendum)
Pt desat to 87-88% when laying down. Pt reports she used O2 at home sometimes when she needs it. Pt placed on 3L [place]. O2 sat increased to 96%

## 2016-07-31 NOTE — ED Triage Notes (Addendum)
Pt was called by MD told that her potassium was 7.3--- states needs dialysis -- family states pt wanted to be a DNR a month ago and that the dr thought she was refusing care and dialysis,.  Pt has moist productive cough--

## 2016-07-31 NOTE — ED Provider Notes (Signed)
[facility] DEPT Provider Note   CSN: [id number] Arrival date & time: 07/31/16  1902     History   Chief Complaint Chief Complaint  Patient presents with  . hyperkalemia    HPI [name] is a 82 y.o. female.  HPI   Presents with concern for abnormal BMP, with reported potassium of 7.3 when she had labs checked with her primary care physician. Also reports she's had decreased appetite, occasional nausea, and wet cough. Denies fevers. Reports she's had chronic urinary symptoms, however no significant change today. Reports she is creating urine today. Denies diarrhea, abdominal pain, fevers, shortness of breath, chest pain.  Patient was admitted in December for concern of acute on chronic kidney disease, N STEMI, volume overload. She had palliative care discussions where she decided to be DO NOT RESUSCITATE, and per notes, and did not want to be started on dialysis. Son is at bedside with her now, and reports that if she had to have dialysis she would want it, and patient agrees. They do state that she is DO NOT RESUSCITATE, and would not want to be on the ventilator.   Past Medical History:  Diagnosis Date  . Anemia   . Arthritis   . CAD (coronary artery disease)   . CAD (coronary artery disease)    a. Stents in [facility] 2001 and cath 2008 with possible balloon or stent at [facility] (not available in [place]).   . Cancer ([place])    scalp  . Chronic combined systolic and diastolic CHF (congestive heart failure) ([place])    a. EF 45-50% in 0000000. b. Mixed systolic/diastolic - EF [id number] by echo 2014, 35% by nuc.  . CKD (chronic kidney disease), stage IV ([place]) 08/29/2012  . COPD (chronic obstructive pulmonary disease) ([place])   . Depression   . Gastrointestinal hemorrhage    a. Pt reports she has h/o this many years ago, requiring 2 units of blood. Thinks it was in [place] but records not available. She does not know why this happened.  Marland Kitchen GERD (gastroesophageal reflux disease)     . H/O medication noncompliance    a. Per notes, due to cost.   . Headache(784.0)   . Hemorrhoid    a. Mild hemorrhoidal bleeding per previous DC note.  Marland Kitchen Hx of peptic ulcer   . Hypertension   . Myocardial infarction   . Nephrolithiasis   . On home oxygen therapy    "use it only when I need it; not very much" (06/12/2016)  . Osteoarthritis, generalized   . Stroke [facility])    a. 2008: acute stroke in left centrum ovale. 06/2010: hemorrhagic stroke.    Patient Active Problem List   Diagnosis Date Noted  . UTI (urinary tract infection) 07/31/2016  . HCAP (healthcare-associated pneumonia) 07/31/2016  . Acute pulmonary edema ([place]) 06/12/2016  . Hypertensive emergency 06/12/2016  . Metabolic acidosis [id number]  . Chronic anemia 06/12/2016  . CKD (chronic kidney disease) stage 5, GFR less than 15 ml/min (HCC) 06/12/2016  . Acute respiratory failure ([place]) 06/12/2016  . Goals of care, counseling/discussion   . Palliative care by specialist   . Acute on chronic combined systolic and diastolic heart failure ([place])   . Community acquired pneumonia   . Palliative care encounter   . Advanced directives, counseling/discussion   . Encounter for hospice care discussion   . CAP (community acquired pneumonia) 08/01/2015  . Acute systolic congestive heart failure, NYHA class 4 ([place]) 02/22/2014  . NSTEMI (non-ST elevated myocardial infarction) (  [place]) 02/22/2014  . 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 CHF exacerbation ([place]) 02/20/2014  . Diastolic CHF, acute on chronic (HCC) 02/20/2014  . Chronic obstructive asthma, unspecified 09/13/2012  . Renal dysfunction- Gd 4 08/29/2012  . Hypokalemia 08/29/2012  . Acute exacerbation of CHF (congestive heart failure) ([place]) 08/25/2012  . HYPERTENSION, BENIGN ESSENTIAL, LABILE 07/24/2010  . CAD 07/24/2010  . CEREBROVASCULAR ACCIDENT 07/24/2010  .  GASTROINTESTINAL HEMORRHAGE 07/24/2010  . NEPHROLITHIASIS 07/24/2010  . OSTEOARTHRITIS, GENERALIZED 07/24/2010  . PEPTIC ULCER DISEASE, HX OF 07/24/2010    Past Surgical History:  Procedure Laterality Date  . ABDOMINAL HYSTERECTOMY    . APPENDECTOMY    . CHOLECYSTECTOMY    . COLON SURGERY    . COLONOSCOPY  07/18/10  . EYE SURGERY    . MELANOMA EXCISION  1989   distal right LE   . TONSILLECTOMY      OB History    No data available       Home Medications    Prior to Admission medications   Medication Sig Start Date End Date Taking? Authorizing Provider  amLODipine (NORVASC) 10 MG tablet Take 1 tablet (10 mg total) by mouth daily. 06/14/16  Yes Shanker Kristeen Mans, MD  aspirin 81 MG chewable tablet Chew 1 tablet (81 mg total) by mouth daily. Patient taking differently: Chew 81 mg by mouth 2 (two) times daily.  09/02/12  Yes Neena Rhymes, MD  atorvastatin (LIPITOR) 20 MG tablet Take 1 tablet (20 mg total) by mouth daily at 6 PM. 06/14/16  Yes Shanker Kristeen Mans, MD  carvedilol (COREG) 25 MG tablet Take 1 tablet (25 mg total) by mouth 2 (two) times daily with a meal. 06/14/16  Yes Shanker Kristeen Mans, MD  isosorbide mononitrate (IMDUR) 60 MG 24 hr tablet Take 1 tablet (60 mg total) by mouth daily. 06/14/16  Yes Shanker Kristeen Mans, MD  nitroGLYCERIN (NITROSTAT) 0.4 MG SL tablet Place 1 tablet (0.4 mg total) under the tongue every 5 (five) minutes as needed for chest pain. 06/14/16  Yes Shanker Kristeen Mans, MD  Nutritional Supplements (FEEDING SUPPLEMENT, NEPRO CARB STEADY,) LIQD Take 237 mLs by mouth 2 (two) times daily between meals. 06/14/16  Yes Shanker Kristeen Mans, MD  ranolazine (RANEXA) 500 MG 12 hr tablet Take 1 tablet (500 mg total) by mouth 2 (two) times daily. 06/14/16  Yes Shanker Kristeen Mans, MD  torsemide (DEMADEX) 20 MG tablet Take 4 tablets (80 mg total) by mouth daily. 06/14/16  Yes Shanker Kristeen Mans, MD    Family History Family History  Problem Relation Age of Onset  .  Diabetes Mother   . Hypertension Mother   . Diabetes Sister   . Cancer Son     breast  . Diabetes Sister     Social History Social History  Substance Use Topics  . Smoking status: Current Every Day Smoker    Packs/day: 0.12    Years: 60.00    Types: Cigarettes  . Smokeless tobacco: Never Used  . Alcohol use No     Allergies   Patient has no known allergies.   Review of Systems Review of Systems  Constitutional: Positive for fatigue. Negative for fever.  HENT: Negative for sore throat.   Eyes: Negative for visual disturbance.  Respiratory: Positive for cough. Negative for shortness of breath.   Cardiovascular: Negative for chest pain.  Gastrointestinal: Positive for nausea and  vomiting. Negative for abdominal pain.  Genitourinary: Positive for frequency (chronic). Negative for difficulty urinating and dysuria.  Musculoskeletal: Negative for back pain and neck pain.  Skin: Negative for rash.  Neurological: Negative for syncope and headaches.     Physical Exam Updated Vital Signs BP 167/79   Pulse 77   Temp 98 F (36.7 C) (Oral)   Resp 22   Ht 5\' 2"  (1.575 m)   Wt 147 lb (66.7 kg)   SpO2 (!) 87%   BMI 26.89 kg/m   Physical Exam  Constitutional: She is oriented to person, place, and time. She appears well-developed and well-nourished. No distress.  HENT:  Head: Normocephalic and atraumatic.  Eyes: Conjunctivae and EOM are normal.  Neck: Normal range of motion.  Cardiovascular: Normal rate, regular rhythm, normal heart sounds and intact distal pulses.  Exam reveals no gallop and no friction rub.   No murmur heard. Pulmonary/Chest: Effort normal. No respiratory distress. She has wheezes. She has rhonchi (occasional). She has no rales.  Abdominal: Soft. She exhibits no distension. There is no tenderness. There is no guarding.  Musculoskeletal: She exhibits no edema or tenderness.  Neurological: She is alert and oriented to person, place, and time.  Skin: Skin  is warm and dry. No rash noted. She is not diaphoretic. No erythema.  Nursing note and vitals reviewed.    ED Treatments / Results  Labs (all labs ordered are listed, but only abnormal results are displayed) Labs Reviewed  COMPREHENSIVE METABOLIC PANEL - Abnormal; Notable for the following:       Result Value   Sodium 134 (*)    Chloride 100 (*)    CO2 20 (*)    Glucose, Bld 147 (*)    BUN 97 (*)    Creatinine, Ser 7.10 (*)    Calcium 6.8 (*)    Albumin 3.0 (*)    AST 13 (*)    ALT 11 (*)    GFR calc non Af Amer 5 (*)    GFR calc Af Amer 6 (*)    All other components within normal limits  CBC WITH DIFFERENTIAL/PLATELET - Abnormal; Notable for the following:    WBC 11.6 (*)    RBC 3.83 (*)    Hemoglobin 10.3 (*)    HCT 32.0 (*)    RDW 16.1 (*)    Neutro Abs 10.0 (*)    All other components within normal limits  URINALYSIS, ROUTINE W REFLEX MICROSCOPIC - Abnormal; Notable for the following:    APPearance CLOUDY (*)    Glucose, UA 50 (*)    Hgb urine dipstick SMALL (*)    Protein, ur 30 (*)    Leukocytes, UA LARGE (*)    Bacteria, UA MANY (*)    Squamous Epithelial / LPF 0-5 (*)    All other components within normal limits  I-STAT CHEM 8, ED - Abnormal; Notable for the following:    BUN 94 (*)    Creatinine, Ser 7.50 (*)    Glucose, Bld 143 (*)    Calcium, Ion 0.86 (*)    Hemoglobin 11.2 (*)    HCT 33.0 (*)    All other components within normal limits  CBG MONITORING, ED - Abnormal; Notable for the following:    Glucose-Capillary 131 (*)    All other components within normal limits  CULTURE, BLOOD (ROUTINE X 2)  CULTURE, BLOOD (ROUTINE X 2)  I-STAT CG4 LACTIC ACID, ED  I-STAT CG4 LACTIC ACID, ED  EKG  EKG Interpretation None       Radiology Dg Chest 2 View  Result Date: 07/31/2016 CLINICAL DATA:  Productive cough for 2 days. History of COPD and CHF. Smoker. EXAM: CHEST  2 VIEW COMPARISON:  06/12/2016 FINDINGS: Right upper lung mass measuring 3.2 x  3.6 cm in diameter. This is suspicious for primary neoplasm. CT chest recommended for further evaluation. Emphysematous changes in the lungs. Patchy areas of infiltration or atelectasis in the left mid and lower lung. This could represent superimposed pneumonia. No blunting of costophrenic angles. No pneumothorax. Calcified and tortuous aorta. Normal heart size and pulmonary vascularity. IMPRESSION: 3.6 cm diameter right upper lung mass. Likely to represent lung cancer. CT chest recommended. Patchy infiltration in the left mid and lower lung zones may represent pneumonia. Electronically Signed   By: Lucienne Capers M.D.   On: 07/31/2016 21:35   Ct Chest Wo Contrast  Result Date: 07/31/2016 CLINICAL DATA:  Concern for lung cancer on radiograph. Further evaluation requested. Initial encounter. EXAM: CT CHEST WITHOUT CONTRAST TECHNIQUE: Multidetector CT imaging of the chest was performed following the standard protocol without IV contrast. COMPARISON:  Chest radiograph performed earlier today at 9:06 p.m. FINDINGS: Cardiovascular: The heart is borderline enlarged. Diffuse coronary artery calcifications are seen. Scattered calcification is noted along the thoracic aorta. There is mild aneurysmal dilatation of the descending thoracic aorta to 4.3 cm in maximal diameter. This resolves well above the level of the diaphragm. The great vessels are grossly unremarkable in appearance. Mediastinum/Nodes: A mildly enlarged 1.3 cm precarinal node is seen. No additional mediastinal lymphadenopathy is appreciated. No pericardial effusion is identified. The thyroid gland is grossly unremarkable. No axillary lymphadenopathy is seen. Lungs/Pleura: A 4.0 x 2.8 x 2.6 cm spiculated mass is noted at the right upper lobe, compatible with primary bronchogenic malignancy. There is mild associated pleural thickening; this may simply reflect atelectasis, though pleural spread of disease cannot be entirely excluded. Mild lingular opacity  and bilateral lower lobe opacities raise concern for pneumonia. No pleural effusion or pneumothorax is seen. No additional well-defined masses are identified, though evaluation for mass is limited given airspace opacification. Upper Abdomen: The visualized portions of the liver and spleen are grossly unremarkable. The patient is status post cholecystectomy, with clips noted at the gallbladder fossa. The visualized portions of the pancreas is and adrenal glands are within normal limits. Mild bilateral renal atrophy is noted. Scattered calcification is seen along the proximal abdominal aorta. There is mild aneurysmal dilatation of the proximal abdominal aorta, measuring up to 3.3 cm in AP dimension. Musculoskeletal: No acute osseous abnormalities are identified. Mild sclerosis is noted at the right glenohumeral joint, with joint space loss. The visualized musculature is unremarkable in appearance. IMPRESSION: 1. 4.0 x 2.8 x 2.6 cm spiculated mass at the right upper lobe, compatible with primary bronchogenic malignancy. Associated mild pleural thickening may simply reflect atelectasis, though pleural spread of disease cannot be entirely excluded. 2. Bilateral lower lobe airspace opacities and mild lingular opacity raise concern for multifocal pneumonia. 3. Mildly enlarged 1.3 cm precarinal node, nonspecific in appearance. 4. Borderline cardiomegaly. Diffuse coronary artery calcifications seen. 5. Mild bilateral renal atrophy noted. 6. Mild aneurysmal dilatation of the descending thoracic aorta to 4.3 cm in maximal diameter, which resolves well above the level of the diaphragm. Scattered aortic atherosclerosis. 7. **An incidental finding of potential clinical significance has been found. Mild aneurysmal dilatation of the proximal abdominal aorta, measuring up to 3.3 cm in AP dimension. Recommend followup  by ultrasound in 3 years. This recommendation follows ACR consensus guidelines: White Paper of the ACR Incidental  Findings Committee II on Vascular Findings. Natasha Mead Coll Radiol [id number] ** Electronically Signed   By: Garald Balding M.D.   On: 07/31/2016 23:40    Procedures Procedures (including critical care time)  Medications Ordered in ED Medications  sodium chloride 0.9 % bolus 1,000 mL (1,000 mLs Intravenous New Bag/Given 07/31/16 2118)  ceFEPIme (MAXIPIME) 1 g in dextrose 5 % 50 mL IVPB (0 g Intravenous Stopped 07/31/16 2150)  vancomycin (VANCOCIN) IVPB 1000 mg/200 mL premix (1,000 mg Intravenous New Bag/Given 07/31/16 2238)     Initial Impression / Assessment and Plan / ED Course  I have reviewed the triage vital signs and the nursing notes.  Pertinent labs & imaging results that were available during my care of the patient were reviewed by me and considered in my medical decision making (see chart for details).     82 year old female with a history of coronary artery disease, chronic systolic and diastolic heart failure, COPD, CK D, recent admission with acute pulmonary edema, hypertensive emergency, acute on chronic renal failure with palliative care discussions presents at the request of her primary care physician for concern of possible hyperkalemia and need for dialysis. Patient previously states she did not want to be dialyzed, however at this time she and her family report she would do it "if she has to."  Reportedly, her potassium as an outpatient was 7.3. Initially ordered hyperkalemia medications given significant elevation and this value, however on return of her labs here potassium is 4.3. Her creatinine is increased to 7.5 from 5.6.  Denies dyspnea, and does not show significant signs of volume overload.  No indication for emergent dialysis at this time. Chest x-ray is concerning for pneumonia and a lung mass. Urinalysis also concerning for UTI. Patient without other signs of sepsis, and with normal lactic acid. She was given vancomycin and cefepime to treat healthcare associated  pneumonia and UTI.  Given appearance of lung mass, CT chest was ordered which confirmed spiculated lung mass concerning for bronchogenic carcinoma. Discussed results with patient and initiated goals of care discussions.  She will be admitted to hospitalist service, Dr. Roel Cluck.    Final Clinical Impressions(s) / ED Diagnoses   Final diagnoses:  Elevated serum creatinine  Lung mass  HCAP (healthcare-associated pneumonia)  Urinary tract infection without hematuria, site unspecified    New Prescriptions New Prescriptions   No medications on file     Gareth Morgan, MD 08/01/16 [phone]

## 2016-07-31 NOTE — ED Notes (Signed)
Pt returned from CT °

## 2016-07-31 NOTE — H&P (Addendum)
[name] [id number] DOB: 12/31/1934 DOA: 07/31/2016     PCP: Lynne Logan, MD   Outpatient Specialists: [facility]   Patient coming from:   home Lives  With family    Chief Complaint: Notified about abnormal labs  HPI: [name] is a 82 y.o. female with medical history significant of CKD stage V, chronic systolic heart failure recent NSTEMI, remote CVA in 2008 and then again in 2012 hemorrhagic stroke, history of peptic ulcer disease, HTN, remote history of GI bleed, systolic heart failure     Presented with some cough and worsening shortness of breath her primary care provider has called her today and told her potassium was 7.3 and that she needs dialysis although patient was DO NOT RESUSCITATE 1 months ago and refuses dialysis at that time. On arrival to emergency department potassium 4.3 but creatinine is elevated to 7.3  Patient has been recently admitted with acute on chronic hypoxemic respiratory failure and end of December. Carotid BiPAP and was able to transition to nasal cannula was able to be diuresed with IV Lasix. Patient at that time during her admission declined father evaluation or dialysis She was diagnosed with non-ST elevation MI versus demand ischemia with significantly elevated troponin declined any further studies was medically   treated with aspirin and statin and beta blocker. Initially came in with hypertensive emergency CVA requiring nitroglycerin infusion Patient smokes and stated that she is not interested in quitting smoked since 21. Reports chronic shortness of breath and endorses weight loss.   Regarding pertinent Chronic problems: Patient has history of echo gram in 2017 showing EF 30-35 percent with diffuse hypokinesis and grade 1 diastolic dysfunction  IN ER:  Temp (24hrs), Avg:98 F (36.7 C), Min:98 F (36.7 C), Max:98 F (36.7 C) RR 20 satting 96% HR 75 BP 1 5958 lactic acid 0.62  Alb 3.0 WBC 11.6 hemoglobin  10.3 Na 137 K 4.2 Cr 7.5   CXR: 3.6 cm right upper lung mass likely lung cancer Following Medications were ordered in ER: Medications  vancomycin (VANCOCIN) IVPB 1000 mg/200 mL premix (1,000 mg Intravenous New Bag/Given 07/31/16 2238)  sodium chloride 0.9 % bolus 1,000 mL (1,000 mLs Intravenous New Bag/Given 07/31/16 2118)  ceFEPIme (MAXIPIME) 1 g in dextrose 5 % 50 mL IVPB (0 g Intravenous Stopped 07/31/16 2150)      Hospitalist was called for admission for HCAP  Review of Systems:    Pertinent positives include: Cough  Constitutional:  No weight loss, night sweats, Fevers, chills, fatigue, weight loss  HEENT:  No headaches, Difficulty swallowing,Tooth/dental problems,Sore throat,  No sneezing, itching, ear ache, nasal congestion, post nasal drip,  Cardio-vascular:  No chest pain, Orthopnea, PND, anasarca, dizziness, palpitations.no Bilateral lower extremity swelling  GI:  No heartburn, indigestion, abdominal pain, nausea, vomiting, diarrhea, change in bowel habits, loss of appetite, melena, blood in stool, hematemesis Resp:  no shortness of breath at rest. No dyspnea on exertion, No excess mucus, no productive cough, No non-productive cough, No coughing up of blood.No change in color of mucus.No wheezing. Skin:  no rash or lesions. No jaundice GU:  no dysuria, change in color of urine, no urgency or frequency. No straining to urinate.  No flank pain.  Musculoskeletal:  No joint pain or no joint swelling. No decreased range of motion. No back pain.  Psych:  No change in mood or affect. No depression or anxiety. No memory loss.  Neuro: no localizing neurological complaints, no tingling, no  weakness, no double vision, no gait abnormality, no slurred speech, no confusion  As per HPI otherwise 10 point review of systems negative.   Past Medical History: Past Medical History:  Diagnosis Date  . Anemia   . Arthritis   . CAD (coronary artery disease)   . CAD (coronary artery  disease)    a. Stents in [facility] 2001 and cath 2008 with possible balloon or stent at [facility] (not available in [place]).   . Cancer ([place])    scalp  . Chronic combined systolic and diastolic CHF (congestive heart failure) ([place])    a. EF 45-50% in 0000000. b. Mixed systolic/diastolic - EF [id number] by echo 2014, 35% by nuc.  . CKD (chronic kidney disease), stage IV ([place]) 08/29/2012  . COPD (chronic obstructive pulmonary disease) ([place])   . Depression   . Gastrointestinal hemorrhage    a. Pt reports she has h/o this many years ago, requiring 2 units of blood. Thinks it was in [place] but records not available. She does not know why this happened.  Marland Kitchen GERD (gastroesophageal reflux disease)   . H/O medication noncompliance    a. Per notes, due to cost.   . Headache(784.0)   . Hemorrhoid    a. Mild hemorrhoidal bleeding per previous DC note.  Marland Kitchen Hx of peptic ulcer   . Hypertension   . Myocardial infarction   . Nephrolithiasis   . On home oxygen therapy    "use it only when I need it; not very much" (06/12/2016)  . Osteoarthritis, generalized   . Stroke [facility])    a. 2008: acute stroke in left centrum ovale. 06/2010: hemorrhagic stroke.   Past Surgical History:  Procedure Laterality Date  . ABDOMINAL HYSTERECTOMY    . APPENDECTOMY    . CHOLECYSTECTOMY    . COLON SURGERY    . COLONOSCOPY  07/18/10  . EYE SURGERY    . MELANOMA EXCISION  1989   distal right LE   . TONSILLECTOMY       Social History:  Ambulatory  independently cane, walker  wheelchair bound, bed bound    reports that she has been smoking Cigarettes.  She has a 7.20 pack-year smoking history. She has never used smokeless tobacco. She reports that she does not drink alcohol or use drugs.  Allergies:  No Known Allergies     Family History:   Family History  Problem Relation Age of Onset  . Diabetes Mother   . Hypertension Mother   . Diabetes Sister   . Cancer Son     breast  . Diabetes Sister      Medications: Prior to Admission medications   Medication Sig Start Date End Date Taking? Authorizing Provider  amLODipine (NORVASC) 10 MG tablet Take 1 tablet (10 mg total) by mouth daily. 06/14/16  Yes Shanker Kristeen Mans, MD  aspirin 81 MG chewable tablet Chew 1 tablet (81 mg total) by mouth daily. Patient taking differently: Chew 81 mg by mouth 2 (two) times daily.  09/02/12  Yes Neena Rhymes, MD  atorvastatin (LIPITOR) 20 MG tablet Take 1 tablet (20 mg total) by mouth daily at 6 PM. 06/14/16  Yes Shanker Kristeen Mans, MD  carvedilol (COREG) 25 MG tablet Take 1 tablet (25 mg total) by mouth 2 (two) times daily with a meal. 06/14/16  Yes Shanker Kristeen Mans, MD  isosorbide mononitrate (IMDUR) 60 MG 24 hr tablet Take 1 tablet (60 mg total) by mouth daily. 06/14/16  Yes Shanker Kristeen Mans, MD  nitroGLYCERIN (NITROSTAT) 0.4 MG SL tablet Place 1 tablet (0.4 mg total) under the tongue every 5 (five) minutes as needed for chest pain. 06/14/16  Yes Shanker Kristeen Mans, MD  Nutritional Supplements (FEEDING SUPPLEMENT, NEPRO CARB STEADY,) LIQD Take 237 mLs by mouth 2 (two) times daily between meals. 06/14/16  Yes Shanker Kristeen Mans, MD  ranolazine (RANEXA) 500 MG 12 hr tablet Take 1 tablet (500 mg total) by mouth 2 (two) times daily. 06/14/16  Yes Shanker Kristeen Mans, MD  torsemide (DEMADEX) 20 MG tablet Take 4 tablets (80 mg total) by mouth daily. 06/14/16  Yes Shanker Kristeen Mans, MD    Physical Exam: Patient Vitals for the past 24 hrs:  BP Temp Temp src Pulse Resp SpO2 Height Weight  07/31/16 2139 159/58 - - 75 20 96 % - -  07/31/16 2000 153/63 - - 72 20 91 % - -  07/31/16 1922 - - - - - - 5\' 2"  (1.575 m) 66.7 kg (147 lb)  07/31/16 1921 137/84 98 F (36.7 C) Oral 78 18 - - -    1. General:  in No Acute distress 2. Psychological: Alert and   Oriented 3. Head/ENT:     Dry Mucous Membranes                          Head Non traumatic, neck supple                            Poor Dentition 4. SKIN:    decreased Skin turgor,  Skin clean Dry and intact no rash 5. Heart: Regular rate and rhythm no Murmur, Rub or gallop 6. Lungs:   no wheezes some crackles  Coarse breath sounds 7. Abdomen: Soft,  non-tender, Non distended 8. Lower extremities: no clubbing, cyanosis, or edema 9. Neurologically Grossly intact, moving all 4 extremities equally   10. MSK: Normal range of motion   body mass index is 26.89 kg/m.  Labs on Admission:   Labs on Admission: I have personally reviewed following labs and imaging studies  CBC:  Recent Labs Lab 07/31/16 1935 07/31/16 2026  WBC 11.6*  --   NEUTROABS 10.0*  --   HGB 10.3* 11.2*  HCT 32.0* 33.0*  MCV 83.6  --   PLT 341  --    Basic Metabolic Panel:  Recent Labs Lab 07/31/16 1935 07/31/16 2026  NA 134* 137  K 4.2 4.2  CL 100* 101  CO2 20*  --   GLUCOSE 147* 143*  BUN 97* 94*  CREATININE 7.10* 7.50*  CALCIUM 6.8*  --    GFR: Estimated Creatinine Clearance: 5.4 mL/min (by C-G formula based on SCr of 7.5 mg/dL (H)). Liver Function Tests:  Recent Labs Lab 07/31/16 1935  AST 13*  ALT 11*  ALKPHOS 62  BILITOT 0.9  PROT 6.9  ALBUMIN 3.0*   No results for input(s): LIPASE, AMYLASE in the last 168 hours. No results for input(s): AMMONIA in the last 168 hours. Coagulation Profile: No results for input(s): INR, PROTIME in the last 168 hours. Cardiac Enzymes: No results for input(s): CKTOTAL, CKMB, CKMBINDEX, TROPONINI in the last 168 hours. BNP (last 3 results) No results for input(s): PROBNP in the last 8760 hours. HbA1C: No results for input(s): HGBA1C in the last 72 hours. CBG:  Recent Labs Lab 07/31/16 2037  GLUCAP 131*   Lipid Profile: No results for input(s): CHOL, HDL, LDLCALC, TRIG,  CHOLHDL, LDLDIRECT in the last 72 hours. Thyroid Function Tests: No results for input(s): TSH, T4TOTAL, FREET4, T3FREE, THYROIDAB in the last 72 hours. Anemia Panel: No results for input(s): VITAMINB12, FOLATE, FERRITIN, TIBC,  IRON, RETICCTPCT in the last 72 hours. Urine analysis:    Component Value Date/Time   COLORURINE YELLOW 07/31/2016 1912   APPEARANCEUR CLOUDY (A) 07/31/2016 1912   LABSPEC 1.011 07/31/2016 1912   PHURINE 5.0 07/31/2016 1912   GLUCOSEU 50 (A) 07/31/2016 1912   HGBUR SMALL (A) 07/31/2016 1912   BILIRUBINUR NEGATIVE 07/31/2016 [place] NEGATIVE 07/31/2016 1912   PROTEINUR 30 (A) 07/31/2016 1912   UROBILINOGEN 1.0 02/20/2014 0455   NITRITE NEGATIVE 07/31/2016 1912   LEUKOCYTESUR LARGE (A) 07/31/2016 1912   Sepsis Labs: @LABRCNTIP (procalcitonin:4,lacticidven:4) )No results found for this or any previous visit (from the past 240 hour(s)).     UA  evidence of UTI     Lab Results  Component Value Date   HGBA1C 5.5 08/02/2015    Estimated Creatinine Clearance: 5.4 mL/min (by C-G formula based on SCr of 7.5 mg/dL (H)).  BNP (last 3 results) No results for input(s): PROBNP in the last 8760 hours.   ECG REPORT  Independently reviewed Rate: 76  Rhythm: NSR ST&T Change: t wave flattening QTC 495  Filed Weights   07/31/16 1922  Weight: 66.7 kg (147 lb)     Cultures:    Component Value Date/Time   SDES URINE, CLEAN CATCH 08/06/2015 0604   SPECREQUEST NONE 08/06/2015 0604   CULT NO GROWTH 5 DAYS 08/02/2015 1334   REPTSTATUS 08/07/2015 FINAL 08/06/2015 0604     Radiological Exams on Admission: Dg Chest 2 View  Result Date: 07/31/2016 CLINICAL DATA:  Productive cough for 2 days. History of COPD and CHF. Smoker. EXAM: CHEST  2 VIEW COMPARISON:  06/12/2016 FINDINGS: Right upper lung mass measuring 3.2 x 3.6 cm in diameter. This is suspicious for primary neoplasm. CT chest recommended for further evaluation. Emphysematous changes in the lungs. Patchy areas of infiltration or atelectasis in the left mid and lower lung. This could represent superimposed pneumonia. No blunting of costophrenic angles. No pneumothorax. Calcified and tortuous aorta. Normal heart size and  pulmonary vascularity. IMPRESSION: 3.6 cm diameter right upper lung mass. Likely to represent lung cancer. CT chest recommended. Patchy infiltration in the left mid and lower lung zones may represent pneumonia. Electronically Signed   By: Lucienne Capers M.D.   On: 07/31/2016 21:35    Chart has been reviewed    Assessment/Plan   82 y.o. female with medical history significant of CKD stage V, chronic systolic heart failure recent NSTEMI admitted for HCAP likely new diagnosis of lung cancer, UTI progressive worsening of chronic kidney disease patient at this point unsure if she would like to continue with aggressive management are not  Present on Admission: . Acute respiratory failure (HCC) - Worsening hypoxemia chest x-ray worrisome for pneumonia as well as new diagnosis of spiculated mass.   . CKD (chronic kidney disease) stage 5, GFR less than 15 ml/min (HCC) - patient continues to make urine gradual worsening of chronic kidney disease. Currently no evidence of fluid overload or hyper kalemia to suggest need for emergent dialysis. Patient at this point wants to wait until her family is back to determine if she would like to have dialysis at some point. Will notify nephrology . HYPERTENSION, BENIGN ESSENTIAL, LABILE . UTI (urinary tract infection) and results of urine culture and continue cover antibiotics for now  .  HCAP (healthcare-associated pneumonia) -  - will admit for treatment of HCAP will start on appropriate antibiotic coverage.   Obtain sputum cultures, blood cultures if febrile or if decompensates.  Provide oxygen as needed.   . Lung mass -discussed with patient she would like to wait until her family is back in the morning to have this discussed with them in more details to see if she'll be interested in biopsy on 9   Other plan as per orders.  DVT prophylaxis:  heparin    Code Status:   DNI otherwise unsure about being resuscitated as per family leave is limited code for  now  Family Communication:   Family not  at  Bedside    Disposition Plan:  To home once workup is complete and patient is stable                      Palliative care   consulted for goals of care discussion                          Consults called: please consult nephrology in AM  Admission status:   inpatient      Level of care    tele       I have spent a total of 57 min on this admission    Hailey Stormer 08/01/2016, 1:03 AM    [facility]  Pager [phone]   after 2 AM please page floor coverage PA If 7AM-7PM, please contact the day team taking care of the patient  [email or web address]  Password [email or web address]

## 2016-07-31 NOTE — ED Notes (Signed)
Per Dr. Billy Fischer, will not no administer ordered insulin, dextrose, or calcium gluconate.

## 2016-07-31 NOTE — ED Notes (Signed)
Patient transported to X-ray 

## 2016-07-31 NOTE — Progress Notes (Signed)
Pharmacy Antibiotic Note [name] is a 82 y.o. female admitted on 07/31/2016 with pneumonia and volume overload.  Pharmacy has been consulted for vancomycin dosing. History of CKD V but has previously refused dialysis.  Plan: 1. Vancomycin 1000 mg x 1 now; will follow up nephrology plans and adjust vancomycin accordingly  2. Follow up on need for gram negative coverage   Height: 5\' 2"  (157.5 cm) Weight: 147 lb (66.7 kg) IBW/kg (Calculated) : 50.1  Temp (24hrs), Avg:98 F (36.7 C), Min:98 F (36.7 C), Max:98 F (36.7 C)   Recent Labs Lab 07/31/16 1935 07/31/16 2026  WBC 11.6*  --   CREATININE 7.10* 7.50*    Estimated Creatinine Clearance: 5.4 mL/min (by C-G formula based on SCr of 7.5 mg/dL (H)).    No Known Allergies  Antimicrobials this admission: 214 Cefepime x 1 dose  2/14 vancomycin  >>    Vincenza Hews, PharmD, BCPS 07/31/2016, 10:16 PM

## 2016-08-01 ENCOUNTER — Encounter (HOSPITAL_COMMUNITY): Payer: Self-pay | Admitting: Internal Medicine

## 2016-08-01 ENCOUNTER — Inpatient Hospital Stay (HOSPITAL_COMMUNITY): Payer: Medicare Other

## 2016-08-01 DIAGNOSIS — Z9981 Dependence on supplemental oxygen: Secondary | ICD-10-CM | POA: Diagnosis not present

## 2016-08-01 DIAGNOSIS — E440 Moderate protein-calorie malnutrition: Secondary | ICD-10-CM | POA: Insufficient documentation

## 2016-08-01 DIAGNOSIS — K219 Gastro-esophageal reflux disease without esophagitis: Secondary | ICD-10-CM | POA: Diagnosis present

## 2016-08-01 DIAGNOSIS — J440 Chronic obstructive pulmonary disease with acute lower respiratory infection: Secondary | ICD-10-CM | POA: Diagnosis present

## 2016-08-01 DIAGNOSIS — N185 Chronic kidney disease, stage 5: Secondary | ICD-10-CM | POA: Diagnosis present

## 2016-08-01 DIAGNOSIS — Z833 Family history of diabetes mellitus: Secondary | ICD-10-CM | POA: Diagnosis not present

## 2016-08-01 DIAGNOSIS — J9601 Acute respiratory failure with hypoxia: Secondary | ICD-10-CM | POA: Diagnosis present

## 2016-08-01 DIAGNOSIS — N390 Urinary tract infection, site not specified: Secondary | ICD-10-CM | POA: Diagnosis present

## 2016-08-01 DIAGNOSIS — F17210 Nicotine dependence, cigarettes, uncomplicated: Secondary | ICD-10-CM | POA: Diagnosis present

## 2016-08-01 DIAGNOSIS — I714 Abdominal aortic aneurysm, without rupture: Secondary | ICD-10-CM | POA: Diagnosis present

## 2016-08-01 DIAGNOSIS — R05 Cough: Secondary | ICD-10-CM | POA: Diagnosis present

## 2016-08-01 DIAGNOSIS — I252 Old myocardial infarction: Secondary | ICD-10-CM | POA: Diagnosis not present

## 2016-08-01 DIAGNOSIS — J189 Pneumonia, unspecified organism: Principal | ICD-10-CM

## 2016-08-01 DIAGNOSIS — Z993 Dependence on wheelchair: Secondary | ICD-10-CM | POA: Diagnosis not present

## 2016-08-01 DIAGNOSIS — Z87442 Personal history of urinary calculi: Secondary | ICD-10-CM | POA: Diagnosis not present

## 2016-08-01 DIAGNOSIS — R918 Other nonspecific abnormal finding of lung field: Secondary | ICD-10-CM | POA: Diagnosis not present

## 2016-08-01 DIAGNOSIS — I5042 Chronic combined systolic (congestive) and diastolic (congestive) heart failure: Secondary | ICD-10-CM | POA: Diagnosis present

## 2016-08-01 DIAGNOSIS — I132 Hypertensive heart and chronic kidney disease with heart failure and with stage 5 chronic kidney disease, or end stage renal disease: Secondary | ICD-10-CM | POA: Diagnosis present

## 2016-08-01 DIAGNOSIS — Z515 Encounter for palliative care: Secondary | ICD-10-CM | POA: Diagnosis not present

## 2016-08-01 DIAGNOSIS — E785 Hyperlipidemia, unspecified: Secondary | ICD-10-CM | POA: Diagnosis present

## 2016-08-01 DIAGNOSIS — Z66 Do not resuscitate: Secondary | ICD-10-CM | POA: Diagnosis present

## 2016-08-01 DIAGNOSIS — Z8673 Personal history of transient ischemic attack (TIA), and cerebral infarction without residual deficits: Secondary | ICD-10-CM | POA: Diagnosis not present

## 2016-08-01 DIAGNOSIS — Z801 Family history of malignant neoplasm of trachea, bronchus and lung: Secondary | ICD-10-CM | POA: Diagnosis not present

## 2016-08-01 DIAGNOSIS — Z8249 Family history of ischemic heart disease and other diseases of the circulatory system: Secondary | ICD-10-CM | POA: Diagnosis not present

## 2016-08-01 DIAGNOSIS — I2510 Atherosclerotic heart disease of native coronary artery without angina pectoris: Secondary | ICD-10-CM | POA: Diagnosis present

## 2016-08-01 DIAGNOSIS — Y95 Nosocomial condition: Secondary | ICD-10-CM | POA: Diagnosis present

## 2016-08-01 DIAGNOSIS — Z90710 Acquired absence of both cervix and uterus: Secondary | ICD-10-CM | POA: Diagnosis not present

## 2016-08-01 DIAGNOSIS — Z9049 Acquired absence of other specified parts of digestive tract: Secondary | ICD-10-CM | POA: Diagnosis not present

## 2016-08-01 LAB — BLOOD CULTURE ID PANEL (REFLEXED)
ACINETOBACTER BAUMANNII: NOT DETECTED
Candida albicans: NOT DETECTED
Candida glabrata: NOT DETECTED
Candida krusei: NOT DETECTED
Candida parapsilosis: NOT DETECTED
Candida tropicalis: NOT DETECTED
ENTEROCOCCUS SPECIES: NOT DETECTED
Enterobacter cloacae complex: NOT DETECTED
Enterobacteriaceae species: NOT DETECTED
Escherichia coli: NOT DETECTED
HAEMOPHILUS INFLUENZAE: NOT DETECTED
Klebsiella oxytoca: NOT DETECTED
Klebsiella pneumoniae: NOT DETECTED
LISTERIA MONOCYTOGENES: NOT DETECTED
METHICILLIN RESISTANCE: DETECTED — AB
NEISSERIA MENINGITIDIS: NOT DETECTED
PROTEUS SPECIES: NOT DETECTED
Pseudomonas aeruginosa: NOT DETECTED
SERRATIA MARCESCENS: NOT DETECTED
STAPHYLOCOCCUS AUREUS BCID: NOT DETECTED
STAPHYLOCOCCUS SPECIES: DETECTED — AB
STREPTOCOCCUS AGALACTIAE: NOT DETECTED
STREPTOCOCCUS SPECIES: NOT DETECTED
Streptococcus pneumoniae: NOT DETECTED
Streptococcus pyogenes: NOT DETECTED

## 2016-08-01 LAB — COMPREHENSIVE METABOLIC PANEL
ALT: 10 U/L — AB (ref 14–54)
ANION GAP: 15 (ref 5–15)
AST: 9 U/L — ABNORMAL LOW (ref 15–41)
Albumin: 2.6 g/dL — ABNORMAL LOW (ref 3.5–5.0)
Alkaline Phosphatase: 54 U/L (ref 38–126)
BUN: 93 mg/dL — ABNORMAL HIGH (ref 6–20)
CALCIUM: 6.5 mg/dL — AB (ref 8.9–10.3)
CHLORIDE: 104 mmol/L (ref 101–111)
CO2: 19 mmol/L — AB (ref 22–32)
CREATININE: 6.93 mg/dL — AB (ref 0.44–1.00)
GFR calc non Af Amer: 5 mL/min — ABNORMAL LOW (ref >60)
GFR, EST AFRICAN AMERICAN: 6 mL/min — AB (ref >60)
Glucose, Bld: 91 mg/dL (ref 65–99)
Potassium: 4.1 mmol/L (ref 3.5–5.1)
SODIUM: 138 mmol/L (ref 135–145)
Total Bilirubin: 0.9 mg/dL (ref 0.3–1.2)
Total Protein: 6.1 g/dL — ABNORMAL LOW (ref 6.5–8.1)

## 2016-08-01 LAB — STREP PNEUMONIAE URINARY ANTIGEN: STREP PNEUMO URINARY ANTIGEN: NEGATIVE

## 2016-08-01 LAB — GLUCOSE, CAPILLARY
Glucose-Capillary: 93 mg/dL (ref 65–99)
Glucose-Capillary: 129 mg/dL — ABNORMAL HIGH (ref 65–99)
Glucose-Capillary: 179 mg/dL — ABNORMAL HIGH (ref 65–99)
Glucose-Capillary: 98 mg/dL (ref 65–99)

## 2016-08-01 LAB — CBC
HCT: 29.1 % — ABNORMAL LOW (ref 36.0–46.0)
Hemoglobin: 9.2 g/dL — ABNORMAL LOW (ref 12.0–15.0)
MCH: 26.5 pg (ref 26.0–34.0)
MCHC: 31.6 g/dL (ref 30.0–36.0)
MCV: 83.9 fL (ref 78.0–100.0)
Platelets: 297 10*3/uL (ref 150–400)
RBC: 3.47 MIL/uL — ABNORMAL LOW (ref 3.87–5.11)
RDW: 15.8 % — AB (ref 11.5–15.5)
WBC: 10 10*3/uL (ref 4.0–10.5)

## 2016-08-01 LAB — PHOSPHORUS: Phosphorus: 5.5 mg/dL — ABNORMAL HIGH (ref 2.5–4.6)

## 2016-08-01 LAB — IRON AND TIBC
Iron: 13 ug/dL — ABNORMAL LOW (ref 28–170)
SATURATION RATIOS: 6 % — AB (ref 10.4–31.8)
TIBC: 214 ug/dL — AB (ref 250–450)
UIBC: 201 ug/dL

## 2016-08-01 LAB — I-STAT CG4 LACTIC ACID, ED: LACTIC ACID, VENOUS: 0.8 mmol/L (ref 0.5–1.9)

## 2016-08-01 LAB — TROPONIN I
Troponin I: 0.1 ng/mL (ref <0.03)
Troponin I: <0.03 ng/mL (ref <0.03)

## 2016-08-01 LAB — TSH: TSH: 0.392 u[IU]/mL (ref 0.350–4.500)

## 2016-08-01 LAB — FERRITIN: FERRITIN: 263 ng/mL (ref 11–307)

## 2016-08-01 LAB — MAGNESIUM: MAGNESIUM: 1.8 mg/dL (ref 1.7–2.4)

## 2016-08-01 MED ORDER — RANOLAZINE ER 500 MG PO TB12
500.0000 mg | ORAL_TABLET | Freq: Two times a day (BID) | ORAL | Status: DC
  Administered 2016-08-01 – 2016-08-04 (×8): 500 mg via ORAL
  Filled 2016-08-01 (×8): qty 1

## 2016-08-01 MED ORDER — ACETAMINOPHEN 325 MG PO TABS: 650.0000 mg | ORAL_TABLET | Freq: Four times a day (QID) | ORAL | Status: DC | PRN

## 2016-08-01 MED ORDER — HEPARIN SODIUM (PORCINE) 5000 UNIT/ML IJ SOLN
5000.0000 [IU] | Freq: Three times a day (TID) | INTRAMUSCULAR | Status: DC
  Administered 2016-08-01 – 2016-08-04 (×9): 5000 [IU] via SUBCUTANEOUS
  Filled 2016-08-01 (×9): qty 1

## 2016-08-01 MED ORDER — IPRATROPIUM BROMIDE 0.02 % IN SOLN
0.5000 mg | Freq: Four times a day (QID) | RESPIRATORY_TRACT | Status: DC
  Filled 2016-08-01: qty 2.5

## 2016-08-01 MED ORDER — ONDANSETRON HCL 4 MG/2ML IJ SOLN: 4.0000 mg | Freq: Four times a day (QID) | INTRAMUSCULAR | Status: DC | PRN

## 2016-08-01 MED ORDER — INSULIN ASPART 100 UNIT/ML ~~LOC~~ SOLN
0.0000 [IU] | Freq: Three times a day (TID) | SUBCUTANEOUS | Status: DC
  Administered 2016-08-01: 2 [IU] via SUBCUTANEOUS
  Administered 2016-08-01 – 2016-08-02 (×2): 1 [IU] via SUBCUTANEOUS
  Administered 2016-08-03: 2 [IU] via SUBCUTANEOUS

## 2016-08-01 MED ORDER — GLUCERNA SHAKE PO LIQD
237.0000 mL | ORAL | Status: DC
  Administered 2016-08-02 – 2016-08-04 (×3): 237 mL via ORAL

## 2016-08-01 MED ORDER — SODIUM CHLORIDE 0.9 % IV SOLN: 250.0000 mL | INTRAVENOUS | Status: DC | PRN

## 2016-08-01 MED ORDER — NEPRO/CARBSTEADY PO LIQD
237.0000 mL | Freq: Two times a day (BID) | ORAL | Status: DC
  Administered 2016-08-01: 237 mL via ORAL
  Filled 2016-08-01 (×4): qty 237

## 2016-08-01 MED ORDER — AMLODIPINE BESYLATE 10 MG PO TABS
10.0000 mg | ORAL_TABLET | Freq: Every day | ORAL | Status: DC
  Administered 2016-08-01 – 2016-08-04 (×4): 10 mg via ORAL
  Filled 2016-08-01 (×3): qty 1
  Filled 2016-08-01: qty 2

## 2016-08-01 MED ORDER — CARVEDILOL 25 MG PO TABS
25.0000 mg | ORAL_TABLET | Freq: Two times a day (BID) | ORAL | Status: DC
  Administered 2016-08-01 – 2016-08-04 (×7): 25 mg via ORAL
  Filled 2016-08-01 (×7): qty 1

## 2016-08-01 MED ORDER — ACETAMINOPHEN 650 MG RE SUPP: 650.0000 mg | Freq: Four times a day (QID) | RECTAL | Status: DC | PRN

## 2016-08-01 MED ORDER — SODIUM CHLORIDE 0.9% FLUSH
3.0000 mL | Freq: Two times a day (BID) | INTRAVENOUS | Status: DC
  Administered 2016-08-01 – 2016-08-04 (×6): 3 mL via INTRAVENOUS

## 2016-08-01 MED ORDER — ONDANSETRON HCL 4 MG PO TABS: 4.0000 mg | ORAL_TABLET | Freq: Four times a day (QID) | ORAL | Status: DC | PRN

## 2016-08-01 MED ORDER — IPRATROPIUM BROMIDE 0.02 % IN SOLN: 0.5000 mg | Freq: Four times a day (QID) | RESPIRATORY_TRACT | Status: DC | PRN

## 2016-08-01 MED ORDER — SODIUM CHLORIDE 0.9% FLUSH
3.0000 mL | Freq: Two times a day (BID) | INTRAVENOUS | Status: DC
Start: 2016-08-01 — End: 2016-08-04
  Administered 2016-08-01 – 2016-08-02 (×5): 3 mL via INTRAVENOUS

## 2016-08-01 MED ORDER — BOOST / RESOURCE BREEZE PO LIQD
1.0000 | Freq: Two times a day (BID) | ORAL | Status: DC
  Administered 2016-08-02 – 2016-08-04 (×5): 1 via ORAL

## 2016-08-01 MED ORDER — ASPIRIN 81 MG PO CHEW
81.0000 mg | CHEWABLE_TABLET | Freq: Two times a day (BID) | ORAL | Status: DC
  Administered 2016-08-01 – 2016-08-04 (×8): 81 mg via ORAL
  Filled 2016-08-01 (×8): qty 1

## 2016-08-01 MED ORDER — LEVALBUTEROL HCL 0.63 MG/3ML IN NEBU: 0.6300 mg | INHALATION_SOLUTION | Freq: Four times a day (QID) | RESPIRATORY_TRACT | Status: DC | PRN

## 2016-08-01 MED ORDER — SODIUM CHLORIDE 0.9% FLUSH: 3.0000 mL | INTRAVENOUS | Status: DC | PRN

## 2016-08-01 MED ORDER — HYDROCODONE-ACETAMINOPHEN 5-325 MG PO TABS: 1.0000 | ORAL_TABLET | ORAL | Status: DC | PRN

## 2016-08-01 MED ORDER — ATORVASTATIN CALCIUM 20 MG PO TABS
20.0000 mg | ORAL_TABLET | Freq: Every day | ORAL | Status: DC
  Administered 2016-08-01 – 2016-08-03 (×3): 20 mg via ORAL
  Filled 2016-08-01 (×3): qty 1

## 2016-08-01 MED ORDER — GUAIFENESIN ER 600 MG PO TB12
600.0000 mg | ORAL_TABLET | Freq: Two times a day (BID) | ORAL | Status: DC
  Administered 2016-08-01 – 2016-08-04 (×8): 600 mg via ORAL
  Filled 2016-08-01 (×8): qty 1

## 2016-08-01 MED ORDER — INSULIN ASPART 100 UNIT/ML ~~LOC~~ SOLN: 0.0000 [IU] | SUBCUTANEOUS | Status: DC

## 2016-08-01 MED ORDER — DEXTROSE 5 % IV SOLN
500.0000 mg | INTRAVENOUS | Status: DC
  Administered 2016-08-01 – 2016-08-02 (×2): 500 mg via INTRAVENOUS
  Filled 2016-08-01 (×3): qty 0.5

## 2016-08-01 MED ORDER — ISOSORBIDE MONONITRATE ER 60 MG PO TB24
60.0000 mg | ORAL_TABLET | Freq: Every day | ORAL | Status: DC
  Administered 2016-08-01 – 2016-08-04 (×4): 60 mg via ORAL
  Filled 2016-08-01 (×4): qty 1

## 2016-08-01 NOTE — Progress Notes (Signed)
PHARMACY NOTE:  ANTIMICROBIAL RENAL DOSAGE ADJUSTMENT  Current antimicrobial regimen includes a mismatch between antimicrobial dosage and estimated renal function.  As per policy approved by the Pharmacy & Therapeutics and Medical Executive Committees, the antimicrobial dosage will be adjusted accordingly.  Current antimicrobial dosage:  Cefepime 1g IV Q8H  Indication: PNA  Renal Function:  Estimated Creatinine Clearance: 5.4 mL/min (by C-G formula based on SCr of 7.5 mg/dL (H)). []      On intermittent HD, scheduled: []      On CRRT    Antimicrobial dosage has been changed to:  Cefepime 500mg  IV Q24H  Additional comments: Will need adjustment if pt agrees to HD.  Thank you for allowing pharmacy to be a part of this patient's care.  Wynona Neat, PharmD, BCPS  08/01/2016 2:42 AM

## 2016-08-01 NOTE — Progress Notes (Signed)
Stopped MD Maylene Roes in hallway, ask for clarification on pt q4 hour CBG, MD stated change to [place]

## 2016-08-01 NOTE — Progress Notes (Signed)
Initial Nutrition Assessment  DOCUMENTATION CODES:   Non-severe (moderate) malnutrition in context of chronic illness  INTERVENTION:  Boost Breeze po BID, each supplement provides 250 kcal and 9 grams of protein  Glucerna Shake HS, each supplement provides 220 kcal and 10 grams of protein  Change supplement to Nepro Shake when started on dialysis  Provide daily snack  NUTRITION DIAGNOSIS:   Malnutrition related to chronic illness as evidenced by percent weight loss, moderate depletion of body fat, moderate depletions of muscle mass.   GOAL:   Patient will meet greater than or equal to 90% of their needs   MONITOR:   PO intake, Labs, Weight trends, Skin, I & O's, Supplement acceptance  REASON FOR ASSESSMENT:   Consult Assessment of nutrition requirement/status (Malnutrition)  ASSESSMENT:   82 y.o. female with medical history significant of CKD stage V, chronic systolic heart failure recent NSTEMI, remote CVA in 2008 and then again in 2012 hemorrhagic stroke, history of peptic ulcer disease, HTN, remote history of GI bleed, systolic heart failure who presents with cough, worsening SOB. PCP called her and told her that her potassium was 7.3 and that she would require dialysis. On arrival to ED , potassium was 4.3. In reviewing last admission notes, she had previously refused dialysis. However willing to pursue dialysis at this point.    Pt states that she has had a decrease appetite, but has been eating what she wants to. She reports snacking on fruit and banana pudding in the middle of the day and eating a normal dinner at night. She reports drinking Pepsi, coffee, and tea during the day. RD recommended clear sodas and liquids to reduce phosphorus intake. Weight history shows 10% wt loss in the past 3 months which is severe for time frame. Pt has moderate muscle wasting and moderate fat wasting per nutrition focused physical exam. She reports that she has used Glucerna Shakes  and Nepro Shakes at home in the past and is willing to drink them while admitted.   Labs: low hemoglobin, high BUN (93), high creatinine (6.93), low calcium, low GFR, high phosphorus  Diet Order:  Diet renal with fluid restriction Fluid restriction: 1200 mL Fluid; Room service appropriate? Yes; Fluid consistency: Thin  Skin:  Reviewed, no issues  Last BM:  2/14  Height:   Ht Readings from Last 1 Encounters:  08/01/16 5\' 2"  (1.575 m)    Weight:   Wt Readings from Last 1 Encounters:  08/01/16 144 lb 1.6 oz (65.4 kg)    Ideal Body Weight:  50 kg  BMI:  Body mass index is 26.36 kg/m.  Estimated Nutritional Needs:   Kcal:  1500-1700  Protein:  60-70 grams  Fluid:  per MD  EDUCATION NEEDS:   No education needs identified at this time  Scarlette Ar RD, LDN, CSP Inpatient Clinical Dietitian Pager: [phone] After Hours Pager: [phone]

## 2016-08-01 NOTE — Consult Note (Signed)
HPI: I was asked by Dr. Lorne Skeens to see [name] who is a 82 y.o. female with PMH significant for CKD V, HFrEF, STEMI, CVA, hemorrhagic stroke, and HTN who presented with cough and SOB. PCP had also called patient prior to arrival to ED to let her know that K was 7.3 and she would require HD. Labs checked upon arrival to the ED revealed a K of 4.3. Patient found to have acute hypoxemic respiratory failure due to PNA. Additionally, CXR was significant for right upper lung mass. CT chest performed which showed a spiculated mass at the right upper lung lobe compatible with primary bronchogenic malignancy.Nephrology was consulted due to CKD and to evaluate patient as HD candidate.   Patient reports long standing history of kidney disease as well as nephrolithiasis. Has never been seen by a nephrologist and has been managed by her PCP. Son at bedside reports some worsening of patient's confusion in the past few weeks. Son reports she is currently at her baseline mentation. Has had decreased appetite. Urinating normally. Endorses chronic nausea/vomiting that is unchanged from baseline. Endorses itching and muscle cramps. SOB especially with ambulation.    Past Medical History:  Diagnosis Date  . Anemia   . Arthritis   . CAD (coronary artery disease)   . CAD (coronary artery disease)    a. Stents in [facility] 2001 and cath 2008 with possible balloon or stent at [facility] (not available in [place]).   . Cancer ([place])    scalp  . Chronic combined systolic and diastolic CHF (congestive heart failure) ([place])    a. EF 45-50% in 1740. b. Mixed systolic/diastolic - EF 81-44% by echo 2014, 35% by nuc.  . CKD (chronic kidney disease), stage IV ([place]) 08/29/2012  . COPD (chronic obstructive pulmonary disease) ([place])   . Depression   . Gastrointestinal hemorrhage    a. Pt reports she has h/o this many years ago, requiring 2 units of blood. Thinks it was in [place] but records not available. She does  not know why this happened.  Marland Kitchen GERD (gastroesophageal reflux disease)   . H/O medication noncompliance    a. Per notes, due to cost.   . Headache(784.0)   . Hemorrhoid    a. Mild hemorrhoidal bleeding per previous DC note.  Marland Kitchen Hx of peptic ulcer   . Hypertension   . Myocardial infarction   . Nephrolithiasis   . On home oxygen therapy    "use it only when I need it; not very much" (06/12/2016)  . Osteoarthritis, generalized   . Stroke [facility])    a. 2008: acute stroke in left centrum ovale. 06/2010: hemorrhagic stroke.   Past Surgical History:  Procedure Laterality Date  . ABDOMINAL HYSTERECTOMY    . APPENDECTOMY    . CHOLECYSTECTOMY    . COLON SURGERY    . COLONOSCOPY  07/18/10  . EYE SURGERY    . MELANOMA EXCISION  1989   distal right LE   . TONSILLECTOMY     Social History:  reports that she has been smoking Cigarettes.  She has a 7.20 pack-year smoking history. She has never used smokeless tobacco. She reports that she does not drink alcohol or use drugs. Allergies: No Known Allergies Family History  Problem Relation Age of Onset  . Diabetes Mother   . Hypertension Mother   . Lung cancer Father   . Diabetes Sister   . Cancer Son     breast  . Lung cancer Son   .  Diabetes Sister   . Lung cancer Brother     Medications: I have reviewed the patient's current medications.  ROS: See HPI  Blood pressure (!) 135/48, pulse 70, temperature 98.6 F (37 C), temperature source Oral, resp. rate 18, height _0  (1.575 m), weight 144 lb 1.6 oz (65.4 kg), SpO2 98 %.  General appearance: alert, cooperative and no distress, thin  Head: Normocephalic, without obvious abnormality, atraumatic Eyes: PERRL. EOMI. Fundi with changes of HTN retinopathy Resp: on [place], globally diminished breath sounds throughout, normal WOB ^ resonance percussion Cardio: regular rate and rhythm, S1, S2 normal, no murmur, click, rub or gallop G Gr 2/6 M GI: soft, non-tender; bowel sounds normal; no masses,   no organomegaly Extremities: global atrophy of musculature, poor pedal pulses bilaterally, loss of hair on toes  Bilat fem bruits Skin: pale, shiny skin of lower extremities trophic changes Neurologic: global weakness in all extremities  Results for orders placed or performed during the hospital encounter of 07/31/16 (from the past 48 hour(s))  Urinalysis, Routine w reflex microscopic     Status: Abnormal   Collection Time: 07/31/16  7:12 PM  Result Value Ref Range   Color, Urine YELLOW YELLOW   APPearance CLOUDY (A) CLEAR   Specific Gravity, Urine 1.011 1.005 - 1.030   pH 5.0 5.0 - 8.0   Glucose, UA 50 (A) NEGATIVE mg/dL   Hgb urine dipstick SMALL (A) NEGATIVE   Bilirubin Urine NEGATIVE NEGATIVE   Ketones, ur NEGATIVE NEGATIVE mg/dL   Protein, ur 30 (A) NEGATIVE mg/dL   Nitrite NEGATIVE NEGATIVE   Leukocytes, UA LARGE (A) NEGATIVE   RBC / HPF 0-5 0 - 5 RBC/hpf   WBC, UA TOO NUMEROUS TO COUNT 0 - 5 WBC/hpf   Bacteria, UA MANY (A) NONE SEEN   Squamous Epithelial / LPF 0-5 (A) NONE SEEN   WBC Clumps PRESENT   Comprehensive metabolic panel     Status: Abnormal   Collection Time: 07/31/16  7:35 PM  Result Value Ref Range   Sodium 134 (L) 135 - 145 mmol/L   Potassium 4.2 3.5 - 5.1 mmol/L   Chloride 100 (L) 101 - 111 mmol/L   CO2 20 (L) 22 - 32 mmol/L   Glucose, Bld 147 (H) 65 - 99 mg/dL   BUN 97 (H) 6 - 20 mg/dL   Creatinine, Ser 7.10 (H) 0.44 - 1.00 mg/dL   Calcium 6.8 (L) 8.9 - 10.3 mg/dL   Total Protein 6.9 6.5 - 8.1 g/dL   Albumin 3.0 (L) 3.5 - 5.0 g/dL   AST 13 (L) 15 - 41 U/L   ALT 11 (L) 14 - 54 U/L   Alkaline Phosphatase 62 38 - 126 U/L   Total Bilirubin 0.9 0.3 - 1.2 mg/dL   GFR calc non Af Amer 5 (L) >60 mL/min   GFR calc Af Amer 6 (L) >60 mL/min    Comment: (NOTE) The eGFR has been calculated using the CKD EPI equation. This calculation has not been validated in all clinical situations. eGFR's persistently <60 mL/min signify possible Chronic Kidney Disease.     Anion gap 14 5 - 15  CBC with Differential     Status: Abnormal   Collection Time: 07/31/16  7:35 PM  Result Value Ref Range   WBC 11.6 (H) 4.0 - 10.5 K/uL   RBC 3.83 (L) 3.87 - 5.11 MIL/uL   Hemoglobin 10.3 (L) 12.0 - 15.0 g/dL   HCT 32.0 (L) 36.0 - 46.0 %  MCV 83.6 78.0 - 100.0 fL   MCH 26.9 26.0 - 34.0 pg   MCHC 32.2 30.0 - 36.0 g/dL   RDW 16.1 (H) 11.5 - 15.5 %   Platelets 341 150 - 400 K/uL   Neutrophils Relative % 87 %   Neutro Abs 10.0 (H) 1.7 - 7.7 K/uL   Lymphocytes Relative 6 %   Lymphs Abs 0.7 0.7 - 4.0 K/uL   Monocytes Relative 6 %   Monocytes Absolute 0.7 0.1 - 1.0 K/uL   Eosinophils Relative 1 %   Eosinophils Absolute 0.1 0.0 - 0.7 K/uL   Basophils Relative 0 %   Basophils Absolute 0.0 0.0 - 0.1 K/uL  I-Stat Chem 8, ED     Status: Abnormal   Collection Time: 07/31/16  8:26 PM  Result Value Ref Range   Sodium 137 135 - 145 mmol/L   Potassium 4.2 3.5 - 5.1 mmol/L   Chloride 101 101 - 111 mmol/L   BUN 94 (H) 6 - 20 mg/dL   Creatinine, Ser 7.50 (H) 0.44 - 1.00 mg/dL   Glucose, Bld 143 (H) 65 - 99 mg/dL   Calcium, Ion 0.86 (LL) 1.15 - 1.40 mmol/L   TCO2 22 0 - 100 mmol/L   Hemoglobin 11.2 (L) 12.0 - 15.0 g/dL   HCT 33.0 (L) 36.0 - 46.0 %  CBG monitoring, ED     Status: Abnormal   Collection Time: 07/31/16  8:37 PM  Result Value Ref Range   Glucose-Capillary 131 (H) 65 - 99 mg/dL  I-Stat CG4 Lactic Acid, ED     Status: None   Collection Time: 07/31/16 10:23 PM  Result Value Ref Range   Lactic Acid, Venous 0.62 0.5 - 1.9 mmol/L  I-Stat CG4 Lactic Acid, ED     Status: None   Collection Time: 08/01/16  1:25 AM  Result Value Ref Range   Lactic Acid, Venous 0.80 0.5 - 1.9 mmol/L  Magnesium     Status: None   Collection Time: 08/01/16  4:13 AM  Result Value Ref Range   Magnesium 1.8 1.7 - 2.4 mg/dL  Phosphorus     Status: Abnormal   Collection Time: 08/01/16  4:13 AM  Result Value Ref Range   Phosphorus 5.5 (H) 2.5 - 4.6 mg/dL  TSH     Status: None    Collection Time: 08/01/16  4:13 AM  Result Value Ref Range   TSH 0.392 0.350 - 4.500 uIU/mL    Comment: Performed by a 3rd Generation assay with a functional sensitivity of <=0.01 uIU/mL.  Comprehensive metabolic panel     Status: Abnormal   Collection Time: 08/01/16  4:13 AM  Result Value Ref Range   Sodium 138 135 - 145 mmol/L   Potassium 4.1 3.5 - 5.1 mmol/L   Chloride 104 101 - 111 mmol/L   CO2 19 (L) 22 - 32 mmol/L   Glucose, Bld 91 65 - 99 mg/dL   BUN 93 (H) 6 - 20 mg/dL   Creatinine, Ser 6.93 (H) 0.44 - 1.00 mg/dL   Calcium 6.5 (L) 8.9 - 10.3 mg/dL   Total Protein 6.1 (L) 6.5 - 8.1 g/dL   Albumin 2.6 (L) 3.5 - 5.0 g/dL   AST 9 (L) 15 - 41 U/L   ALT 10 (L) 14 - 54 U/L   Alkaline Phosphatase 54 38 - 126 U/L   Total Bilirubin 0.9 0.3 - 1.2 mg/dL   GFR calc non Af Amer 5 (L) >60 mL/min   GFR calc Af  Amer 6 (L) >60 mL/min    Comment: (NOTE) The eGFR has been calculated using the CKD EPI equation. This calculation has not been validated in all clinical situations. eGFR's persistently <60 mL/min signify possible Chronic Kidney Disease.    Anion gap 15 5 - 15  CBC     Status: Abnormal   Collection Time: 08/01/16  4:13 AM  Result Value Ref Range   WBC 10.0 4.0 - 10.5 K/uL   RBC 3.47 (L) 3.87 - 5.11 MIL/uL   Hemoglobin 9.2 (L) 12.0 - 15.0 g/dL   HCT 29.1 (L) 36.0 - 46.0 %   MCV 83.9 78.0 - 100.0 fL   MCH 26.5 26.0 - 34.0 pg   MCHC 31.6 30.0 - 36.0 g/dL   RDW 15.8 (H) 11.5 - 15.5 %   Platelets 297 150 - 400 K/uL  Troponin I     Status: Abnormal   Collection Time: 08/01/16  4:13 AM  Result Value Ref Range   Troponin I 0.10 (HH) <0.03 ng/mL    Comment: CRITICAL RESULT CALLED TO, READ BACK BY AND VERIFIED WITH: BRUNDIA A,RN 08/01/16 0539 WAYK   Glucose, capillary     Status: None   Collection Time: 08/01/16  6:02 AM  Result Value Ref Range   Glucose-Capillary 93 65 - 99 mg/dL  Troponin I     Status: None   Collection Time: 08/01/16  7:53 AM  Result Value Ref Range    Troponin I <0.03 <0.03 ng/mL  Glucose, capillary     Status: Abnormal   Collection Time: 08/01/16 12:16 PM  Result Value Ref Range   Glucose-Capillary 129 (H) 65 - 99 mg/dL   Dg Chest 2 View  Result Date: 07/31/2016 CLINICAL DATA:  Productive cough for 2 days. History of COPD and CHF. Smoker. EXAM: CHEST  2 VIEW COMPARISON:  06/12/2016 FINDINGS: Right upper lung mass measuring 3.2 x 3.6 cm in diameter. This is suspicious for primary neoplasm. CT chest recommended for further evaluation. Emphysematous changes in the lungs. Patchy areas of infiltration or atelectasis in the left mid and lower lung. This could represent superimposed pneumonia. No blunting of costophrenic angles. No pneumothorax. Calcified and tortuous aorta. Normal heart size and pulmonary vascularity. IMPRESSION: 3.6 cm diameter right upper lung mass. Likely to represent lung cancer. CT chest recommended. Patchy infiltration in the left mid and lower lung zones may represent pneumonia. Electronically Signed   By: Lucienne Capers M.D.   On: 07/31/2016 21:35   Ct Chest Wo Contrast  Result Date: 07/31/2016 CLINICAL DATA:  Concern for lung cancer on radiograph. Further evaluation requested. Initial encounter. EXAM: CT CHEST WITHOUT CONTRAST TECHNIQUE: Multidetector CT imaging of the chest was performed following the standard protocol without IV contrast. COMPARISON:  Chest radiograph performed earlier today at 9:06 p.m. FINDINGS: Cardiovascular: The heart is borderline enlarged. Diffuse coronary artery calcifications are seen. Scattered calcification is noted along the thoracic aorta. There is mild aneurysmal dilatation of the descending thoracic aorta to 4.3 cm in maximal diameter. This resolves well above the level of the diaphragm. The great vessels are grossly unremarkable in appearance. Mediastinum/Nodes: A mildly enlarged 1.3 cm precarinal node is seen. No additional mediastinal lymphadenopathy is appreciated. No pericardial effusion is  identified. The thyroid gland is grossly unremarkable. No axillary lymphadenopathy is seen. Lungs/Pleura: A 4.0 x 2.8 x 2.6 cm spiculated mass is noted at the right upper lobe, compatible with primary bronchogenic malignancy. There is mild associated pleural thickening; this may simply reflect atelectasis, though  pleural spread of disease cannot be entirely excluded. Mild lingular opacity and bilateral lower lobe opacities raise concern for pneumonia. No pleural effusion or pneumothorax is seen. No additional well-defined masses are identified, though evaluation for mass is limited given airspace opacification. Upper Abdomen: The visualized portions of the liver and spleen are grossly unremarkable. The patient is status post cholecystectomy, with clips noted at the gallbladder fossa. The visualized portions of the pancreas is and adrenal glands are within normal limits. Mild bilateral renal atrophy is noted. Scattered calcification is seen along the proximal abdominal aorta. There is mild aneurysmal dilatation of the proximal abdominal aorta, measuring up to 3.3 cm in AP dimension. Musculoskeletal: No acute osseous abnormalities are identified. Mild sclerosis is noted at the right glenohumeral joint, with joint space loss. The visualized musculature is unremarkable in appearance. IMPRESSION: 1. 4.0 x 2.8 x 2.6 cm spiculated mass at the right upper lobe, compatible with primary bronchogenic malignancy. Associated mild pleural thickening may simply reflect atelectasis, though pleural spread of disease cannot be entirely excluded. 2. Bilateral lower lobe airspace opacities and mild lingular opacity raise concern for multifocal pneumonia. 3. Mildly enlarged 1.3 cm precarinal node, nonspecific in appearance. 4. Borderline cardiomegaly. Diffuse coronary artery calcifications seen. 5. Mild bilateral renal atrophy noted. 6. Mild aneurysmal dilatation of the descending thoracic aorta to 4.3 cm in maximal diameter, which  resolves well above the level of the diaphragm. Scattered aortic atherosclerosis. 7. **An incidental finding of potential clinical significance has been found. Mild aneurysmal dilatation of the proximal abdominal aorta, measuring up to 3.3 cm in AP dimension. Recommend followup by ultrasound in 3 years. This recommendation follows ACR consensus guidelines: White Paper of the ACR Incidental Findings Committee II on Vascular Findings. Natasha Mead Coll Radiol 2013; 28:786-767 ** Electronically Signed   By: Garald Balding M.D.   On: 07/31/2016 23:40    Assessment/Plan: [name] is 82 y.o. female with CKD V, HFrEF, STEMI, CVA, hemorrhagic stroke, and HTN who presented due to hyperkalemia from outside PCP lab draw and SOB/cough. Potassium at admission stable at 4.2. Patient found to be hypoxic and to have a multilobar PNA. Imaging significant for mass suspicious for lung carcinoma.   1. CKD V: Unknown cause of CKD but likely due to long-standing HTN and nephrolithiasis. Baseline Cr 5.5-6.2/GFR 7-10. Cr at admission 7.1 with GFR of 5. Patient not anuric. UA with large leukocytes, nitrite negative, and TNTC WBCs present which is suspicious for UTI. Will obtain urine culture to evaluate further (verified with lab that they will be able to do add on culture to originally collected urine as patient has now been on broad spectrum antibiotics). Will obtain renal ultrasound to evaluate for obstruction and the degree of atrophy of kidneys. No indication for acute dialysis at present as patient has stable electrolytes, is not volume overloaded, and is not severely acidotic. Given suspicion for malignancy, family and patient will need to weigh benefit of HD vs. Quality of life in setting of poor prognosis long term. Will hold off on obtaining access as this discussion occurs.  Early uremia but poor candidate for ESRD mgmt with all severe comorbidities, Educated family, and will cont discussions.   2. Anemia: Normocytic.  Hemoglobin stable. Likely of chronic disease. Will obtain Fe panel and PTH.  3. Lung Mass Suspicious for Malignancy: Per primary. Palliative care has been consulted. Will need to heavily weigh risk vs. Benefit of HD given prognosis.  4. HFrEF: Patient on Torsemide at home. Held  at admission by primary team due to worsening kidney function.  5. PNA: Per primary. Currently on Cefepime and Vancomycin.  6. HTN 7. CAD with h/o CVA and STEMI    Melina Schools 08/01/2016, 2:49 PM I have seen and examined this patient and agree with the plan of care seen, eval, examined, discussed with residents,and counseled family.  .  Farhana Fellows L 08/01/2016, 3:02 PM

## 2016-08-01 NOTE — Progress Notes (Signed)
Pt. With troponin of 0.10 this am. On call for [facility] made aware via text page. RN will continue to monitor for changes in condition. Jillaine Waren, Katherine Roan

## 2016-08-01 NOTE — Progress Notes (Signed)
Pt. Arrived to the unit via stretcher from ED. Pt. Alert and oriented. No s/s of distress or discomfort noted. Pt. Oriented to room and Placed on telemetry. CCMD notified. Call light placed within reach. RN will continue to monitor for changes in condition. Oslo Huntsman, Katherine Roan

## 2016-08-01 NOTE — Evaluation (Signed)
Occupational Therapy Evaluation Patient Details Name: [name] MRN: [id number] DOB: [birth year] Today's Date: 08/01/2016    History of Present Illness  82 y.o. female with admission for acute respiratory failure, has lung mass and not decided how to proceed, has UTI and HCAP, elevated troponin, recent NSTEMI.  PMHx: CKD 5, HTN, MI, CVA 2008 and 2012, CAD, CHF.    Clinical Impression   Pt was admitted for the above. At baseline, she is mod I to independent with adls. She currently needs min guard to min A.  Goals in acute are for supervision level    Follow Up Recommendations  Supervision/Assistance - 24 hour;Home health OT    Equipment Recommendations  None recommended by OT    Recommendations for Other Services       Precautions / Restrictions Precautions Precautions: Fall Restrictions Weight Bearing Restrictions: No      Mobility Bed Mobility           Sit to supine: Supervision      Transfers   Equipment used: 1 person hand held assist   Sit to Stand: Min guard         General transfer comment: for safety. Did not use RW as room was very tight with spatial constraints    Balance                                            ADL Overall ADL's : Needs assistance/impaired     Grooming: Min guard;Standing   Upper Body Bathing: Set up;Sitting   Lower Body Bathing: Min guard;Sit to/from stand   Upper Body Dressing : Set up;Sitting   Lower Body Dressing: Minimal assistance;Sit to/from stand   Toilet Transfer: Minimal assistance;Ambulation;Regular Toilet;Grab bars   Toileting- Clothing Manipulation and Hygiene: Min guard;Sit to/from stand         General ADL Comments: ambulated to bathroom; pt tended to furniture walk; min A to steady.  pt has been able to perform basic adls at baseline. Son had to take early retirement and is home with her.  Pt on 2 liters 02     Vision     Perception     Praxis       Pertinent Vitals/Pain       Hand Dominance Right   Extremity/Trunk Assessment Upper Extremity Assessment Upper Extremity Assessment: Generalized weakness           Communication Communication Communication: HOH   Cognition Arousal/Alertness: Awake/alert Behavior During Therapy: WFL for tasks assessed/performed Overall Cognitive Status: No family available to determine baseline                 General Comments: pt knew not to get up alone;decreased insight into deficits   General Comments       Exercises       Shoulder Instructions      Home Living Family/patient expects to be discharged to:: Private residence Living Arrangements: Children Available Help at Discharge: Family;Available 24 hours/day               Bathroom Shower/Tub: Occupational psychologist: Standard     Home Equipment: Bedside commode;Walker - 2 wheels;Shower seat          Prior Functioning/Environment Level of Independence: Independent with assistive device(s)        Comments: states she uses walker sometimes, but has  24/7 help at home if needed        OT Problem List: Decreased strength;Decreased activity tolerance;Impaired balance (sitting and/or standing);Cardiopulmonary status limiting activity   OT Treatment/Interventions: Self-care/ADL training;DME and/or AE instruction;Patient/family education;Balance training;Energy conservation    OT Goals(Current goals can be found in the care plan section) Acute Rehab OT Goals Patient Stated Goal: to get stronger and get home OT Goal Formulation: With patient Time For Goal Achievement: 08/08/16 Potential to Achieve Goals: Good ADL Goals Pt Will Transfer to Toilet: with supervision;ambulating;bedside commode Additional ADL Goal #1: pt will complete ADL, sit to stand at sink with set up/supervision Additional ADL Goal #2: pt will initiate a rest break as needed for energy conservation  OT Frequency: Min 2X/week    Barriers to D/C:            Co-evaluation              End of Session    Activity Tolerance: Patient tolerated treatment well Patient left: in bed;with call bell/phone within reach;with bed alarm set   Time: 1555-1613 OT Time Calculation (min): 18 min Charges:  OT General Charges $OT Visit: 1 Procedure OT Evaluation $OT Eval Moderate Complexity: 1 Procedure G-Codes:    Donisha Hoch 08-02-16, 4:35 PM  Lesle Chris, OTR/L [phone] 08-02-16

## 2016-08-01 NOTE — Progress Notes (Addendum)
Paged MD regarding pt requesting to speak to her. MD Maylene Roes returned page and stated she talked to the son on the phone, asked if she could call again to clarify biopsy procedure to family memeber  [name]

## 2016-08-01 NOTE — Progress Notes (Signed)
PHARMACY - PHYSICIAN COMMUNICATION CRITICAL VALUE ALERT - BLOOD CULTURE IDENTIFICATION (BCID)  Results for orders placed or performed during the hospital encounter of 07/31/16  Blood Culture ID Panel (Reflexed) (Collected: 07/31/2016  8:24 PM)  Result Value Ref Range   Enterococcus species NOT DETECTED NOT DETECTED   Listeria monocytogenes NOT DETECTED NOT DETECTED   Staphylococcus species DETECTED (A) NOT DETECTED   Staphylococcus aureus NOT DETECTED NOT DETECTED   Methicillin resistance DETECTED (A) NOT DETECTED   Streptococcus species NOT DETECTED NOT DETECTED   Streptococcus agalactiae NOT DETECTED NOT DETECTED   Streptococcus pneumoniae NOT DETECTED NOT DETECTED   Streptococcus pyogenes NOT DETECTED NOT DETECTED   Acinetobacter baumannii NOT DETECTED NOT DETECTED   Enterobacteriaceae species NOT DETECTED NOT DETECTED   Enterobacter cloacae complex NOT DETECTED NOT DETECTED   Escherichia coli NOT DETECTED NOT DETECTED   Klebsiella oxytoca NOT DETECTED NOT DETECTED   Klebsiella pneumoniae NOT DETECTED NOT DETECTED   Proteus species NOT DETECTED NOT DETECTED   Serratia marcescens NOT DETECTED NOT DETECTED   Haemophilus influenzae NOT DETECTED NOT DETECTED   Neisseria meningitidis NOT DETECTED NOT DETECTED   Pseudomonas aeruginosa NOT DETECTED NOT DETECTED   Candida albicans NOT DETECTED NOT DETECTED   Candida glabrata NOT DETECTED NOT DETECTED   Candida krusei NOT DETECTED NOT DETECTED   Candida parapsilosis NOT DETECTED NOT DETECTED   Candida tropicalis NOT DETECTED NOT DETECTED    Name of physician (or Provider) Contacted: Not needed  Changes to prescribed antibiotics required: None needed, likely contaminant, on Vancomycin  Tad Moore 08/01/2016  10:33 PM

## 2016-08-01 NOTE — Progress Notes (Signed)
PROGRESS NOTE    [name]  [id number] DOB: 19-Sep-1934 DOA: 07/31/2016 PCP: Lynne Logan, MD     Brief Narrative:  [name] is a 82 y.o. female with medical history significant of CKD stage V, chronic systolic heart failure recent NSTEMI, remote CVA in 2008 and then again in 2012 hemorrhagic stroke, history of peptic ulcer disease, HTN, remote history of GI bleed, systolic heart failure who presents with cough, worsening SOB. PCP called her and told her that her potassium was 7.3 and that she would require dialysis. On arrival to ED , potassium was 4.3. In reviewing last admission notes, she had previously refused dialysis. However willing to pursue dialysis at this point.    Assessment & Plan:   Active Problems:   HYPERTENSION, BENIGN ESSENTIAL, LABILE   CAP (community acquired pneumonia)   CKD (chronic kidney disease) stage 5, GFR less than 15 ml/min (HCC)   Acute respiratory failure (HCC)   UTI (urinary tract infection)   HCAP (healthcare-associated pneumonia)   Lung mass  CKD stage V -Has previously refused dialysis per previous notes. However, in conversation with son, they had never refused dialysis and wants to pursue dialysis this admission -Consulted Nephrology today  Acute hypoxemic respiratory failure due to multilobar HCAP -Blood cultures, sputum culture pending -Continue cefepime and vanco for now  -Florin O2 for now   Right spiculated lung mass -Patient and son are quite poor historians. They tell me that she had lung cancer in the past and son states that she "cleared it up." I can't find any evidence in notes regarding her previous cancer diagnosis. Will have to continue to discuss with son as I suspect she will require biopsy if they would like to pursue this mass once her acute illnesses as above are treated   HTN -Continue norvasc, coreg  HLD -Continue lipitor   CAD -Continue aspirin, imdur, ranexa  -Recent NSTEMI 05/2016, decided  against further investigations and work up at that time   Chronic systolic heart failure -Last admission, discharged with demadex. Hold for now due to worsening CKD   AAA -Mild aneurysmal dilatation of the proximal abdominal aorta, measuring up to 3.3 cm in AP dimension. Recommend followup by ultrasound in 3 years.   Goals of care -Previously was DNR with enrollment in hospice. However, in discussion with patient and son, she is to be DO NOT INTUBATE, but would like to pursue CPR, defib, meds, if her heart were to stop. -Patient's son interestingly mentions that he would like patient to start dialysis then enroll in hospice when she is discharged. We discussed the purpose of dialysis as well as what hospice offers. He at this time would want dialysis for patient. Palliative care was consulted at time of admission, will leave order. I think it would benefit for family to have multiple conversations about goals of care.    DVT prophylaxis: subq hep  Code Status: PARTIAL CODE. DO NOT INTUBATE.  Family Communication: son over the phone this morning Disposition Plan: pending further stabilization    Consultants:   Nephrology  Procedures:   None  Antimicrobials:  Anti-infectives    Start     Dose/Rate Route Frequency Ordered Stop   08/01/16 0245  ceFEPIme (MAXIPIME) 500 mg in dextrose 5 % 50 mL IVPB     500 mg 100 mL/hr over 30 Minutes Intravenous Every 24 hours 08/01/16 0233 08/09/16 2159   07/31/16 2215  vancomycin (VANCOCIN) IVPB 1000 mg/200 mL premix  1,000 mg 200 mL/hr over 60 Minutes Intravenous  Once 07/31/16 2210 08/01/16 0122   07/31/16 2100  ceFEPIme (MAXIPIME) 1 g in dextrose 5 % 50 mL IVPB     1 g 100 mL/hr over 30 Minutes Intravenous  Once 07/31/16 2022 07/31/16 2150        Subjective: She is quite a poor historian, no hx of previously diagnosed dementia. She tells me that she will do whatever her son thinks is best. When discussed dialysis, she continues to  talk about how she does not want a breathing machine. I explained to her the difference and purpose of dialysis. She states that no one has ever mentioned dialysis to her and she knows nothing about it. I did call and spoke to her son who would like to pursue dialysis at this time    Objective: Vitals:   08/01/16 0100 08/01/16 0243 08/01/16 0527 08/01/16 0832  BP: 158/71 (!) 146/57 (!) 148/62 (!) 135/48  Pulse: 78 80 72 66  Resp: (!) 27  18 18   Temp:  98 F (36.7 C) 98.6 F (37 C)   TempSrc:  Oral Oral   SpO2: 99% 95% 99% 99%  Weight:  65.4 kg (144 lb 1.6 oz)    Height:  5\' 2"  (1.575 m)      Intake/Output Summary (Last 24 hours) at 08/01/16 1015 Last data filed at 08/01/16 0831  Gross per 24 hour  Intake              720 ml  Output                0 ml  Net              720 ml   Filed Weights   07/31/16 1922 08/01/16 0243  Weight: 66.7 kg (147 lb) 65.4 kg (144 lb 1.6 oz)    Examination:  General exam: Appears calm and comfortable  Respiratory system: Diminished lung sounds bilaterally. Respiratory effort normal. Cardiovascular system: S1 & S2 heard, RRR. No JVD, murmurs, rubs, gallops or clicks. No pedal edema. Gastrointestinal system: Abdomen is nondistended, soft and nontender. No organomegaly or masses felt. Normal bowel sounds heard. Central nervous system: Alert and oriented x3. No focal neurological deficits. Extremities: Symmetric 5 x 5 power. Skin: No rashes, lesions or ulcers Psychiatry: Judgement and insight appear normal. Poor historian, oriented x 3   Data Reviewed: I have personally reviewed following labs and imaging studies  CBC:  Recent Labs Lab 07/31/16 1935 07/31/16 2026 08/01/16 0413  WBC 11.6*  --  10.0  NEUTROABS 10.0*  --   --   HGB 10.3* 11.2* 9.2*  HCT 32.0* 33.0* 29.1*  MCV 83.6  --  83.9  PLT 341  --  [id number]   Basic Metabolic Panel:  Recent Labs Lab 07/31/16 1935 07/31/16 2026 08/01/16 0413  NA 134* 137 138  K 4.2 4.2 4.1  CL 100*  101 104  CO2 20*  --  19*  GLUCOSE 147* 143* 91  BUN 97* 94* 93*  CREATININE 7.10* 7.50* 6.93*  CALCIUM 6.8*  --  6.5*  MG  --   --  1.8  PHOS  --   --  5.5*   GFR: Estimated Creatinine Clearance: 5.8 mL/min (by C-G formula based on SCr of 6.93 mg/dL (H)). Liver Function Tests:  Recent Labs Lab 07/31/16 1935 08/01/16 0413  AST 13* 9*  ALT 11* 10*  ALKPHOS 62 54  BILITOT 0.9 0.9  PROT 6.9  6.1*  ALBUMIN 3.0* 2.6*   No results for input(s): LIPASE, AMYLASE in the last 168 hours. No results for input(s): AMMONIA in the last 168 hours. Coagulation Profile: No results for input(s): INR, PROTIME in the last 168 hours. Cardiac Enzymes:  Recent Labs Lab 08/01/16 0413 08/01/16 0753  TROPONINI 0.10* <0.03   BNP (last 3 results) No results for input(s): PROBNP in the last 8760 hours. HbA1C: No results for input(s): HGBA1C in the last 72 hours. CBG:  Recent Labs Lab 07/31/16 2037 08/01/16 0602  GLUCAP 131* 93   Lipid Profile: No results for input(s): CHOL, HDL, LDLCALC, TRIG, CHOLHDL, LDLDIRECT in the last 72 hours. Thyroid Function Tests:  Recent Labs  08/01/16 0413  TSH 0.392   Anemia Panel: No results for input(s): VITAMINB12, FOLATE, FERRITIN, TIBC, IRON, RETICCTPCT in the last 72 hours. Sepsis Labs:  Recent Labs Lab 07/31/16 2223 08/01/16 0125  LATICACIDVEN 0.62 0.80    No results found for this or any previous visit (from the past 240 hour(s)).     Radiology Studies: Dg Chest 2 View  Result Date: 07/31/2016 CLINICAL DATA:  Productive cough for 2 days. History of COPD and CHF. Smoker. EXAM: CHEST  2 VIEW COMPARISON:  06/12/2016 FINDINGS: Right upper lung mass measuring 3.2 x 3.6 cm in diameter. This is suspicious for primary neoplasm. CT chest recommended for further evaluation. Emphysematous changes in the lungs. Patchy areas of infiltration or atelectasis in the left mid and lower lung. This could represent superimposed pneumonia. No blunting of  costophrenic angles. No pneumothorax. Calcified and tortuous aorta. Normal heart size and pulmonary vascularity. IMPRESSION: 3.6 cm diameter right upper lung mass. Likely to represent lung cancer. CT chest recommended. Patchy infiltration in the left mid and lower lung zones may represent pneumonia. Electronically Signed   By: Lucienne Capers M.D.   On: 07/31/2016 21:35   Ct Chest Wo Contrast  Result Date: 07/31/2016 CLINICAL DATA:  Concern for lung cancer on radiograph. Further evaluation requested. Initial encounter. EXAM: CT CHEST WITHOUT CONTRAST TECHNIQUE: Multidetector CT imaging of the chest was performed following the standard protocol without IV contrast. COMPARISON:  Chest radiograph performed earlier today at 9:06 p.m. FINDINGS: Cardiovascular: The heart is borderline enlarged. Diffuse coronary artery calcifications are seen. Scattered calcification is noted along the thoracic aorta. There is mild aneurysmal dilatation of the descending thoracic aorta to 4.3 cm in maximal diameter. This resolves well above the level of the diaphragm. The great vessels are grossly unremarkable in appearance. Mediastinum/Nodes: A mildly enlarged 1.3 cm precarinal node is seen. No additional mediastinal lymphadenopathy is appreciated. No pericardial effusion is identified. The thyroid gland is grossly unremarkable. No axillary lymphadenopathy is seen. Lungs/Pleura: A 4.0 x 2.8 x 2.6 cm spiculated mass is noted at the right upper lobe, compatible with primary bronchogenic malignancy. There is mild associated pleural thickening; this may simply reflect atelectasis, though pleural spread of disease cannot be entirely excluded. Mild lingular opacity and bilateral lower lobe opacities raise concern for pneumonia. No pleural effusion or pneumothorax is seen. No additional well-defined masses are identified, though evaluation for mass is limited given airspace opacification. Upper Abdomen: The visualized portions of the liver  and spleen are grossly unremarkable. The patient is status post cholecystectomy, with clips noted at the gallbladder fossa. The visualized portions of the pancreas is and adrenal glands are within normal limits. Mild bilateral renal atrophy is noted. Scattered calcification is seen along the proximal abdominal aorta. There is mild aneurysmal dilatation of the  proximal abdominal aorta, measuring up to 3.3 cm in AP dimension. Musculoskeletal: No acute osseous abnormalities are identified. Mild sclerosis is noted at the right glenohumeral joint, with joint space loss. The visualized musculature is unremarkable in appearance. IMPRESSION: 1. 4.0 x 2.8 x 2.6 cm spiculated mass at the right upper lobe, compatible with primary bronchogenic malignancy. Associated mild pleural thickening may simply reflect atelectasis, though pleural spread of disease cannot be entirely excluded. 2. Bilateral lower lobe airspace opacities and mild lingular opacity raise concern for multifocal pneumonia. 3. Mildly enlarged 1.3 cm precarinal node, nonspecific in appearance. 4. Borderline cardiomegaly. Diffuse coronary artery calcifications seen. 5. Mild bilateral renal atrophy noted. 6. Mild aneurysmal dilatation of the descending thoracic aorta to 4.3 cm in maximal diameter, which resolves well above the level of the diaphragm. Scattered aortic atherosclerosis. 7. **An incidental finding of potential clinical significance has been found. Mild aneurysmal dilatation of the proximal abdominal aorta, measuring up to 3.3 cm in AP dimension. Recommend followup by ultrasound in 3 years. This recommendation follows ACR consensus guidelines: White Paper of the ACR Incidental Findings Committee II on Vascular Findings. Natasha Mead Coll Radiol [id number] ** Electronically Signed   By: Garald Balding M.D.   On: 07/31/2016 23:40      Scheduled Meds: . amLODipine  10 mg Oral Daily  . aspirin  81 mg Oral BID  . atorvastatin  20 mg Oral q1800  .  carvedilol  25 mg Oral BID WC  . ceFEPime (MAXIPIME) IV  500 mg Intravenous Q24H  . feeding supplement (NEPRO CARB STEADY)  237 mL Oral BID BM  . guaiFENesin  600 mg Oral BID  . insulin aspart  0-9 Units Subcutaneous TID WC  . ipratropium  0.5 mg Nebulization Q6H  . isosorbide mononitrate  60 mg Oral Daily  . ranolazine  500 mg Oral BID  . sodium chloride flush  3 mL Intravenous Q12H  . sodium chloride flush  3 mL Intravenous Q12H   Continuous Infusions:   LOS: 0 days    Time spent: 50 minutes   Dessa Phi, DO [facility] [email or web address] Password [email or web address] 08/01/2016, 10:15 AM

## 2016-08-01 NOTE — Progress Notes (Signed)
MD Maylene Roes at bedside talking to family now  [employer]

## 2016-08-01 NOTE — Progress Notes (Signed)
Palliative Medicine Team consult was received.   I stopped by to meet with patient and she reports that her son needs to be part of any conversations regarding goals of care and to talk to him about anything I need to discuss.  I called and spoke with her son on the phone.  He reports that he has already spoken with 2 doctors today and declined to meet with me.  He was very pleasant, but clear in desire not to meet.  I explained goal of palliative is to improve quality of life for both patient and her family.  He reports that he has enough information, plan is in place, and he does see a benefit to further discussion with more doctors.  He knows that he can call to set up appointment if he changes his mind.  I will attempt to follow up over the next day or two if patient and family are agreeable.  Micheline Rough, MD [place] Palliative Medicine Team [phone]   NO CHARGE NOTE

## 2016-08-01 NOTE — ED Notes (Signed)
Due to pt position and pt continuously bending arm, only estimated ~300-400 mL has been administered. Pt has been frequently reminded to maintain proper arm placement but has stated she "has trouble getting comfortable". Moved NS bolus from R IV site to L IV site to facilitate infusion.

## 2016-08-01 NOTE — ED Notes (Signed)
Admitting provider at bedside.

## 2016-08-01 NOTE — Evaluation (Signed)
Physical Therapy Evaluation Patient Details Name: [name] MRN: [id number] DOB: 02/10/1935 Today's Date: 08/01/2016   History of Present Illness   82 y.o. female with admission for acute respiratory failure, has lung mass and not decided how to proceed, has UTI and HCAP, elevated troponin, recent NSTEMI.  PMHx: CKD 5, HTN, MI, CVA 2008 and 2012, CAD, CHF.   Clinical Impression  Pt is demonstrating some ability to help with her balance, but is not safe to walk alone with her endurance and her safety awareness.  Pt was more relaxed after walking on the hall, mainly seemed to need to get out of her room and stretch.  Her plan is to follow acutely to ease transition home with family, and should make excellent progress given PLOF and her motivation to get home.     Follow Up Recommendations Home health PT;Supervision/Assistance - 24 hour    Equipment Recommendations  None recommended by PT    Recommendations for Other Services       Precautions / Restrictions Precautions Precautions: Fall (telemetry) Restrictions Weight Bearing Restrictions: No      Mobility  Bed Mobility Overal bed mobility: Needs Assistance Bed Mobility: Supine to Sit;Sit to Supine     Supine to sit: Min assist Sit to supine: Min assist      Transfers Overall transfer level: Needs assistance Equipment used: Rolling walker (2 wheeled);1 person hand held assist Transfers: Sit to/from [facility] Sit to Stand: Min assist Stand pivot transfers: Min assist       General transfer comment: cues for hand placement although pt tends to want to do herself  Ambulation/Gait Ambulation/Gait assistance: Min guard;Min assist Ambulation Distance (Feet): 75 Feet Assistive device: Rolling walker (2 wheeled);1 person hand held assist Gait Pattern/deviations: Step-to pattern;Step-through pattern;Trunk flexed;Narrow base of support;Shuffle Gait velocity: reduced Gait velocity interpretation:  Below normal speed for age/gender General Gait Details: cued for direction of walker as pt does not attend to obstacles on the hall  Stairs            Wheelchair Mobility    Modified Rankin (Stroke Patients Only)       Balance Overall balance assessment: Needs assistance Sitting-balance support: Feet supported Sitting balance-Leahy Scale: Fair   Postural control: Posterior lean Standing balance support: Bilateral upper extremity supported Standing balance-Leahy Scale: Poor                               Pertinent Vitals/Pain Pain Assessment: No/denies pain    Home Living Family/patient expects to be discharged to:: Private residence Living Arrangements: Children;Other relatives Available Help at Discharge: Family;Available 24 hours/day Type of Home: House Home Access: Level entry     Home Layout: One level Home Equipment: Bedside commode;Walker - 2 wheels;Shower seat Additional Comments: Information from previous admission as pt not able to talk about her PLOF    Prior Function Level of Independence: Independent with assistive device(s)         Comments: states she uses walker sometimes, but has 24/7 help at home if needed     Hand Dominance   Dominant Hand: Right    Extremity/Trunk Assessment   Upper Extremity Assessment Upper Extremity Assessment: Generalized weakness    Lower Extremity Assessment Lower Extremity Assessment: Generalized weakness    Cervical / Trunk Assessment Cervical / Trunk Assessment: Normal  Communication   Communication: HOH  Cognition Arousal/Alertness: Awake/alert Behavior During Therapy: Anxious Overall Cognitive Status:  History of cognitive impairments - at baseline                      General Comments      Exercises     Assessment/Plan    PT Assessment Patient needs continued PT services  PT Problem List Decreased strength;Decreased range of motion;Decreased activity  tolerance;Decreased balance;Decreased mobility;Decreased coordination;Decreased cognition;Decreased knowledge of use of DME;Decreased safety awareness;Decreased knowledge of precautions;Cardiopulmonary status limiting activity          PT Treatment Interventions DME instruction;Gait training;Functional mobility training;Therapeutic activities;Therapeutic exercise;Balance training;Neuromuscular re-education;Patient/family education    PT Goals (Current goals can be found in the Care Plan section)  Acute Rehab PT Goals Patient Stated Goal: to get stronger and get home PT Goal Formulation: With patient Time For Goal Achievement: 08/15/16 Potential to Achieve Goals: Good    Frequency Min 3X/week   Barriers to discharge   will need 24/7 help at home for her cognition    Co-evaluation               End of Session Equipment Utilized During Treatment: Gait belt Activity Tolerance: Patient tolerated treatment well;Patient limited by fatigue Patient left: in bed;with call bell/phone within reach (bed alarm not activated but bed is broken, nsg aware) Nurse Communication: Mobility status         Time: [id number] PT Time Calculation (min) (ACUTE ONLY): 22 min   Charges:   PT Evaluation $PT Eval Moderate Complexity: 1 Procedure     PT G Codes:        Ramond Dial 08-18-2016, 12:53 PM  Mee Hives, PT MS Acute Rehab Dept. Number: [place]

## 2016-08-02 ENCOUNTER — Ambulatory Visit: Payer: Medicare Other | Admitting: Internal Medicine

## 2016-08-02 LAB — CBC WITH DIFFERENTIAL/PLATELET
BASOS ABS: 0 10*3/uL (ref 0.0–0.1)
Basophils Relative: 0 %
EOS ABS: 0.3 10*3/uL (ref 0.0–0.7)
EOS PCT: 3 %
HCT: 29 % — ABNORMAL LOW (ref 36.0–46.0)
Hemoglobin: 9.2 g/dL — ABNORMAL LOW (ref 12.0–15.0)
LYMPHS PCT: 11 %
Lymphs Abs: 1 10*3/uL (ref 0.7–4.0)
MCH: 26.6 pg (ref 26.0–34.0)
MCHC: 31.7 g/dL (ref 30.0–36.0)
MCV: 83.8 fL (ref 78.0–100.0)
Monocytes Absolute: 0.7 10*3/uL (ref 0.1–1.0)
Monocytes Relative: 7 %
NEUTROS PCT: 79 %
Neutro Abs: 7.7 10*3/uL (ref 1.7–7.7)
PLATELETS: 307 10*3/uL (ref 150–400)
RBC: 3.46 MIL/uL — AB (ref 3.87–5.11)
RDW: 16 % — ABNORMAL HIGH (ref 11.5–15.5)
WBC: 9.7 10*3/uL (ref 4.0–10.5)

## 2016-08-02 LAB — BASIC METABOLIC PANEL
ANION GAP: 16 — AB (ref 5–15)
BUN: 94 mg/dL — ABNORMAL HIGH (ref 6–20)
CO2: 19 mmol/L — ABNORMAL LOW (ref 22–32)
Calcium: 6.7 mg/dL — ABNORMAL LOW (ref 8.9–10.3)
Chloride: 102 mmol/L (ref 101–111)
Creatinine, Ser: 6.61 mg/dL — ABNORMAL HIGH (ref 0.44–1.00)
GFR calc Af Amer: 6 mL/min — ABNORMAL LOW (ref >60)
GFR, EST NON AFRICAN AMERICAN: 5 mL/min — AB (ref >60)
Glucose, Bld: 105 mg/dL — ABNORMAL HIGH (ref 65–99)
POTASSIUM: 4.1 mmol/L (ref 3.5–5.1)
SODIUM: 137 mmol/L (ref 135–145)

## 2016-08-02 LAB — GLUCOSE, CAPILLARY
GLUCOSE-CAPILLARY: 142 mg/dL — AB (ref 65–99)
Glucose-Capillary: 109 mg/dL — ABNORMAL HIGH (ref 65–99)
Glucose-Capillary: 112 mg/dL — ABNORMAL HIGH (ref 65–99)
Glucose-Capillary: 135 mg/dL — ABNORMAL HIGH (ref 65–99)

## 2016-08-02 LAB — PARATHYROID HORMONE, INTACT (NO CA): PTH: 255 pg/mL — ABNORMAL HIGH (ref 15–65)

## 2016-08-02 LAB — HEMOGLOBIN A1C
Hgb A1c MFr Bld: 5.1 % (ref 4.8–5.6)
MEAN PLASMA GLUCOSE: 100 mg/dL

## 2016-08-02 LAB — VANCOMYCIN, RANDOM: VANCOMYCIN RM: 12

## 2016-08-02 MED ORDER — VANCOMYCIN HCL IN DEXTROSE 1-5 GM/200ML-% IV SOLN
1000.0000 mg | INTRAVENOUS | Status: DC
  Administered 2016-08-02: 1000 mg via INTRAVENOUS
  Filled 2016-08-02 (×2): qty 200

## 2016-08-02 NOTE — Progress Notes (Addendum)
PROGRESS NOTE    [name]  [id number] DOB: 01-26-1935 DOA: 07/31/2016 PCP: Lynne Logan, MD     Brief Narrative:  [name] is a 82 y.o. female with medical history significant of CKD stage V, chronic systolic heart failure recent NSTEMI, remote CVA in 2008 and then again in 2012 hemorrhagic stroke, history of peptic ulcer disease, HTN, remote history of GI bleed, systolic heart failure who presents with cough, worsening SOB. PCP called her and told her that her potassium was 7.3 and that she would require dialysis. On arrival to ED , potassium was 4.3. In reviewing last admission notes, she had previously refused dialysis. However willing to pursue dialysis at this point.    Assessment & Plan:   Active Problems:   HYPERTENSION, BENIGN ESSENTIAL, LABILE   CAP (community acquired pneumonia)   CKD (chronic kidney disease) stage 5, GFR less than 15 ml/min (HCC)   Acute respiratory failure (HCC)   UTI (urinary tract infection)   HCAP (healthcare-associated pneumonia)   Lung mass   Malnutrition of moderate degree  CKD stage V -Nephrology is following -Long discussion regarding goals of care as below   Acute hypoxemic respiratory failure due to multilobar HCAP, unknown organism  -Blood cultures, sputum culture pending -Continue cefepime and vanco for now  -[place] O2 for now   Right spiculated lung mass -Patient and son are quite poor historians. Initially they had told me that patient had had lung cancer in the past. I cannot find any records of this. Today, she tells me that she has never had lung cancer, but had scalp cancer in the past. -Long discussion regarding goals of care as below   HTN -Continue norvasc, coreg  HLD -Continue lipitor   CAD -Continue aspirin, imdur, ranexa  -Recent NSTEMI 05/2016, decided against further investigations and work up at that time   Chronic systolic heart failure -Last admission, discharged with demadex. Hold for now due  to worsening CKD   AAA -Mild aneurysmal dilatation of the proximal abdominal aorta, measuring up to 3.3 cm in AP dimension. Recommend followup by ultrasound in 3 years.   Goals of care -Previously was DNR with enrollment in hospice. However, in discussion with patient and son, she is to be DO NOT INTUBATE, but would like to pursue CPR, defib, meds, if her heart were to stop. -2/15: Patient's son interestingly mentions that he would like patient to start dialysis then enroll in hospice when she is discharged. We discussed the purpose of dialysis as well as what hospice offers. He at this time would want dialysis for patient. Palliative care was consulted at time of admission, will leave order. I think it would benefit for family to have multiple conversations about goals of care. -2/16: Patient continues to defer all decisions to son. I had a long conversation with son over the phone this morning. We discussed quality of life vs pursuing aggressive measures such as biopsy of lung mass and potential treatment as well as HD and the lack of quality of life she would have. We discussed that just because we can do these medical therapies, does not necessarily mean we should put her through them, if her goal is to be comfortable at home without being in and out of hospital with various procedures. We discussed that these things would prolong suffering without necessarily bringing joy to her life. He seemed more willing to meet with Palliative care. Discussed with Dr. Domingo Cocking. Tentative family meeting 2/17 2pm.  DVT prophylaxis: subq hep  Code Status: PARTIAL CODE. DO NOT INTUBATE.  Family Communication: son over the phone this morning Disposition Plan: pending further conversation for goals of care    Consultants:   Nephrology  Procedures:   None  Antimicrobials:  Anti-infectives    Start     Dose/Rate Route Frequency Ordered Stop   08/01/16 0245  ceFEPIme (MAXIPIME) 500 mg in dextrose 5 % 50  mL IVPB     500 mg 100 mL/hr over 30 Minutes Intravenous Every 24 hours 08/01/16 0233 08/09/16 2159   07/31/16 2215  vancomycin (VANCOCIN) IVPB 1000 mg/200 mL premix     1,000 mg 200 mL/hr over 60 Minutes Intravenous  Once 07/31/16 2210 08/01/16 0122   07/31/16 2100  ceFEPIme (MAXIPIME) 1 g in dextrose 5 % 50 mL IVPB     1 g 100 mL/hr over 30 Minutes Intravenous  Once 07/31/16 2022 07/31/16 2150        Subjective: No new complaints. Continues to defer all medical decisions to son.    Objective: Vitals:   08/01/16 2033 08/02/16 0020 08/02/16 0507 08/02/16 1003  BP: (!) 145/48 (!) 135/45 (!) 141/51 (!) 124/45  Pulse: 66 65 66   Resp: 18 20 18    Temp: 98.3 F (36.8 C) 98.4 F (36.9 C) 98.4 F (36.9 C)   TempSrc: Oral Oral Oral   SpO2: 96% 96% 95%   Weight:   65.6 kg (144 lb 11.2 oz)   Height:        Intake/Output Summary (Last 24 hours) at 08/02/16 1053 Last data filed at 08/02/16 1013  Gross per 24 hour  Intake              560 ml  Output              500 ml  Net               60 ml   Filed Weights   07/31/16 1922 08/01/16 0243 08/02/16 0507  Weight: 66.7 kg (147 lb) 65.4 kg (144 lb 1.6 oz) 65.6 kg (144 lb 11.2 oz)    Examination:  General exam: Appears calm and comfortable  Respiratory system: Diminished lung sounds bilaterally. Respiratory effort normal. On [place] O2  Cardiovascular system: S1 & S2 heard, RRR. No JVD, murmurs, rubs, gallops or clicks. No pedal edema. Gastrointestinal system: Abdomen is nondistended, soft and nontender. No organomegaly or masses felt. Normal bowel sounds heard. Central nervous system: Alert and oriented x3. No focal neurological deficits. Extremities: Symmetric 5 x 5 power. Skin: No rashes, lesions or ulcers Psychiatry: Judgement and insight appear normal. Poor historian, oriented x 3   Data Reviewed: I have personally reviewed following labs and imaging studies  CBC:  Recent Labs Lab 07/31/16 1935 07/31/16 2026  08/01/16 0413 08/02/16 0527  WBC 11.6*  --  10.0 9.7  NEUTROABS 10.0*  --   --  7.7  HGB 10.3* 11.2* 9.2* 9.2*  HCT 32.0* 33.0* 29.1* 29.0*  MCV 83.6  --  83.9 83.8  PLT 341  --  297 [id number]   Basic Metabolic Panel:  Recent Labs Lab 07/31/16 1935 07/31/16 2026 08/01/16 0413 08/02/16 0527  NA 134* 137 138 137  K 4.2 4.2 4.1 4.1  CL 100* 101 104 102  CO2 20*  --  19* 19*  GLUCOSE 147* 143* 91 105*  BUN 97* 94* 93* 94*  CREATININE 7.10* 7.50* 6.93* 6.61*  CALCIUM 6.8*  --  6.5* 6.7*  MG  --   --  1.8  --   PHOS  --   --  5.5*  --    GFR: Estimated Creatinine Clearance: 6.1 mL/min (by C-G formula based on SCr of 6.61 mg/dL (H)). Liver Function Tests:  Recent Labs Lab 07/31/16 1935 08/01/16 0413  AST 13* 9*  ALT 11* 10*  ALKPHOS 62 54  BILITOT 0.9 0.9  PROT 6.9 6.1*  ALBUMIN 3.0* 2.6*   No results for input(s): LIPASE, AMYLASE in the last 168 hours. No results for input(s): AMMONIA in the last 168 hours. Coagulation Profile: No results for input(s): INR, PROTIME in the last 168 hours. Cardiac Enzymes:  Recent Labs Lab 08/01/16 0413 08/01/16 0753  TROPONINI 0.10* <0.03   BNP (last 3 results) No results for input(s): PROBNP in the last 8760 hours. HbA1C:  Recent Labs  08/01/16 0413  HGBA1C 5.1   CBG:  Recent Labs Lab 08/01/16 0602 08/01/16 1216 08/01/16 1655 08/01/16 2056 08/02/16 0738  GLUCAP 93 129* 179* 98 109*   Lipid Profile: No results for input(s): CHOL, HDL, LDLCALC, TRIG, CHOLHDL, LDLDIRECT in the last 72 hours. Thyroid Function Tests:  Recent Labs  08/01/16 0413  TSH 0.392   Anemia Panel:  Recent Labs  08/01/16 1530  FERRITIN 263  TIBC 214*  IRON 13*   Sepsis Labs:  Recent Labs Lab 07/31/16 2223 08/01/16 0125  LATICACIDVEN 0.62 0.80    Recent Results (from the past 240 hour(s))  Blood culture (routine x 2)     Status: None (Preliminary result)   Collection Time: 07/31/16  8:24 PM  Result Value Ref Range Status    Specimen Description BLOOD RIGHT ANTECUBITAL  Final   Special Requests BOTTLES DRAWN AEROBIC ONLY 5CC  Final   Culture  Setup Time   Final    GRAM POSITIVE COCCI IN CLUSTERS AEROBIC BOTTLE ONLY Organism ID to follow CRITICAL RESULT CALLED TO, READ BACK BY AND VERIFIED WITH: L POWELL PHARMD 2230 08/01/16 A BROWNING    Culture NO GROWTH < 24 HOURS  Final   Report Status PENDING  Incomplete  Blood Culture ID Panel (Reflexed)     Status: Abnormal   Collection Time: 07/31/16  8:24 PM  Result Value Ref Range Status   Enterococcus species NOT DETECTED NOT DETECTED Final   Listeria monocytogenes NOT DETECTED NOT DETECTED Final   Staphylococcus species DETECTED (A) NOT DETECTED Final    Comment: Methicillin (oxacillin) resistant coagulase negative staphylococcus. Possible blood culture contaminant (unless isolated from more than one blood culture draw or clinical case suggests pathogenicity). No antibiotic treatment is indicated for blood  culture contaminants. CRITICAL RESULT CALLED TO, READ BACK BY AND VERIFIED WITH: L POWELL PHARMD 2230 08/01/16 A BROWNING    Staphylococcus aureus NOT DETECTED NOT DETECTED Final   Methicillin resistance DETECTED (A) NOT DETECTED Final    Comment: CRITICAL RESULT CALLED TO, READ BACK BY AND VERIFIED WITH: L POWELL PHARMD 2230 08/01/16 A BROWNING    Streptococcus species NOT DETECTED NOT DETECTED Final   Streptococcus agalactiae NOT DETECTED NOT DETECTED Final   Streptococcus pneumoniae NOT DETECTED NOT DETECTED Final   Streptococcus pyogenes NOT DETECTED NOT DETECTED Final   Acinetobacter baumannii NOT DETECTED NOT DETECTED Final   Enterobacteriaceae species NOT DETECTED NOT DETECTED Final   Enterobacter cloacae complex NOT DETECTED NOT DETECTED Final   Escherichia coli NOT DETECTED NOT DETECTED Final   Klebsiella oxytoca NOT DETECTED NOT DETECTED Final   Klebsiella pneumoniae NOT DETECTED NOT DETECTED Final  Proteus species NOT DETECTED NOT DETECTED Final    Serratia marcescens NOT DETECTED NOT DETECTED Final   Haemophilus influenzae NOT DETECTED NOT DETECTED Final   Neisseria meningitidis NOT DETECTED NOT DETECTED Final   Pseudomonas aeruginosa NOT DETECTED NOT DETECTED Final   Candida albicans NOT DETECTED NOT DETECTED Final   Candida glabrata NOT DETECTED NOT DETECTED Final   Candida krusei NOT DETECTED NOT DETECTED Final   Candida parapsilosis NOT DETECTED NOT DETECTED Final   Candida tropicalis NOT DETECTED NOT DETECTED Final  Blood culture (routine x 2)     Status: None (Preliminary result)   Collection Time: 07/31/16  8:37 PM  Result Value Ref Range Status   Specimen Description BLOOD LEFT ANTECUBITAL  Final   Special Requests BOTTLES DRAWN AEROBIC ONLY 5CC  Final   Culture NO GROWTH < 24 HOURS  Final   Report Status PENDING  Incomplete       Radiology Studies: Dg Chest 2 View  Result Date: 07/31/2016 CLINICAL DATA:  Productive cough for 2 days. History of COPD and CHF. Smoker. EXAM: CHEST  2 VIEW COMPARISON:  06/12/2016 FINDINGS: Right upper lung mass measuring 3.2 x 3.6 cm in diameter. This is suspicious for primary neoplasm. CT chest recommended for further evaluation. Emphysematous changes in the lungs. Patchy areas of infiltration or atelectasis in the left mid and lower lung. This could represent superimposed pneumonia. No blunting of costophrenic angles. No pneumothorax. Calcified and tortuous aorta. Normal heart size and pulmonary vascularity. IMPRESSION: 3.6 cm diameter right upper lung mass. Likely to represent lung cancer. CT chest recommended. Patchy infiltration in the left mid and lower lung zones may represent pneumonia. Electronically Signed   By: Lucienne Capers M.D.   On: 07/31/2016 21:35   Ct Chest Wo Contrast  Result Date: 07/31/2016 CLINICAL DATA:  Concern for lung cancer on radiograph. Further evaluation requested. Initial encounter. EXAM: CT CHEST WITHOUT CONTRAST TECHNIQUE: Multidetector CT imaging of the  chest was performed following the standard protocol without IV contrast. COMPARISON:  Chest radiograph performed earlier today at 9:06 p.m. FINDINGS: Cardiovascular: The heart is borderline enlarged. Diffuse coronary artery calcifications are seen. Scattered calcification is noted along the thoracic aorta. There is mild aneurysmal dilatation of the descending thoracic aorta to 4.3 cm in maximal diameter. This resolves well above the level of the diaphragm. The great vessels are grossly unremarkable in appearance. Mediastinum/Nodes: A mildly enlarged 1.3 cm precarinal node is seen. No additional mediastinal lymphadenopathy is appreciated. No pericardial effusion is identified. The thyroid gland is grossly unremarkable. No axillary lymphadenopathy is seen. Lungs/Pleura: A 4.0 x 2.8 x 2.6 cm spiculated mass is noted at the right upper lobe, compatible with primary bronchogenic malignancy. There is mild associated pleural thickening; this may simply reflect atelectasis, though pleural spread of disease cannot be entirely excluded. Mild lingular opacity and bilateral lower lobe opacities raise concern for pneumonia. No pleural effusion or pneumothorax is seen. No additional well-defined masses are identified, though evaluation for mass is limited given airspace opacification. Upper Abdomen: The visualized portions of the liver and spleen are grossly unremarkable. The patient is status post cholecystectomy, with clips noted at the gallbladder fossa. The visualized portions of the pancreas is and adrenal glands are within normal limits. Mild bilateral renal atrophy is noted. Scattered calcification is seen along the proximal abdominal aorta. There is mild aneurysmal dilatation of the proximal abdominal aorta, measuring up to 3.3 cm in AP dimension. Musculoskeletal: No acute osseous abnormalities are identified. Mild sclerosis is  noted at the right glenohumeral joint, with joint space loss. The visualized musculature is  unremarkable in appearance. IMPRESSION: 1. 4.0 x 2.8 x 2.6 cm spiculated mass at the right upper lobe, compatible with primary bronchogenic malignancy. Associated mild pleural thickening may simply reflect atelectasis, though pleural spread of disease cannot be entirely excluded. 2. Bilateral lower lobe airspace opacities and mild lingular opacity raise concern for multifocal pneumonia. 3. Mildly enlarged 1.3 cm precarinal node, nonspecific in appearance. 4. Borderline cardiomegaly. Diffuse coronary artery calcifications seen. 5. Mild bilateral renal atrophy noted. 6. Mild aneurysmal dilatation of the descending thoracic aorta to 4.3 cm in maximal diameter, which resolves well above the level of the diaphragm. Scattered aortic atherosclerosis. 7. **An incidental finding of potential clinical significance has been found. Mild aneurysmal dilatation of the proximal abdominal aorta, measuring up to 3.3 cm in AP dimension. Recommend followup by ultrasound in 3 years. This recommendation follows ACR consensus guidelines: White Paper of the ACR Incidental Findings Committee II on Vascular Findings. Natasha Mead Coll Radiol [id number] ** Electronically Signed   By: Garald Balding M.D.   On: 07/31/2016 23:40   US Renal  Result Date: 08/01/2016 CLINICAL DATA:  Chronic kidney disease. EXAM: RENAL / URINARY TRACT ULTRASOUND COMPLETE COMPARISON:  None. FINDINGS: Right Kidney: Length: 7.9 cm. Increased echogenicity of renal parenchyma is noted. 1 cm simple cyst is noted. No mass or hydronephrosis visualized. Left Kidney: Length: 8.3 cm. Increased echogenicity of renal parenchyma is noted. No mass or hydronephrosis visualized. Bladder: Appears normal for degree of bladder distention. IMPRESSION: Increased echogenicity of renal parenchyma is noted bilaterally consistent with medical renal disease. Bilateral renal atrophy is noted. No hydronephrosis or renal obstruction is noted. Electronically Signed   By: Marijo Conception, M.D.    On: 08/01/2016 18:36      Scheduled Meds: . amLODipine  10 mg Oral Daily  . aspirin  81 mg Oral BID  . atorvastatin  20 mg Oral q1800  . carvedilol  25 mg Oral BID WC  . ceFEPime (MAXIPIME) IV  500 mg Intravenous Q24H  . feeding supplement  1 Container Oral BID WC  . feeding supplement (GLUCERNA SHAKE)  237 mL Oral Q24H  . guaiFENesin  600 mg Oral BID  . heparin subcutaneous  5,000 Units Subcutaneous Q8H  . insulin aspart  0-9 Units Subcutaneous TID WC  . isosorbide mononitrate  60 mg Oral Daily  . ranolazine  500 mg Oral BID  . sodium chloride flush  3 mL Intravenous Q12H  . sodium chloride flush  3 mL Intravenous Q12H   Continuous Infusions:   LOS: 1 day    Time spent: 40 minutes   Dessa Phi, DO [facility] [email or web address] Password [facility] 08/02/2016, 10:53 AM

## 2016-08-02 NOTE — Progress Notes (Signed)
Pharmacy Antibiotic Note  [name] is a 82 y.o. female admitted on 07/31/2016 with pneumonia.  Pharmacy has been consulted for Vancomycin and Cefepime dosing.  Patient received 1 x Vancomycin dose 2/14 due to advanced CKD.  Level this AM = 12 (goal 15-20).  Will redose.  Plan: Vancomycin 1gm IV q48 hours Continue Cefepime 500mg  IV q24h Follow-up renal plans for dialysis vs palliative care  Height: 5\' 2"  (157.5 cm) Weight: 144 lb 11.2 oz (65.6 kg) IBW/kg (Calculated) : 50.1  Temp (24hrs), Avg:98.4 F (36.9 C), Min:98.3 F (36.8 C), Max:98.4 F (36.9 C)   Recent Labs Lab 07/31/16 1935 07/31/16 2026 07/31/16 2223 08/01/16 0125 08/01/16 0413 08/02/16 0527 08/02/16 1059  WBC 11.6*  --   --   --  10.0 9.7  --   CREATININE 7.10* 7.50*  --   --  6.93* 6.61*  --   LATICACIDVEN  --   --  0.62 0.80  --   --   --   VANCORANDOM  --   --   --   --   --   --  12    Estimated Creatinine Clearance: 6.1 mL/min (by C-G formula based on SCr of 6.61 mg/dL (H)).    No Known Allergies  Antimicrobials this admission: Vancomycin 2/14 >> Cefepime 2/14 >>  Dose adjustments this admission: 2/16 Vanc Random = 12 (37 hours after last dose) none  Microbiology results: 2/14 BCx: 1/2 staph 2/14 UCx: >100k GNR   Thank you for allowing pharmacy to be a part of this patient's care.  [employer], Pharm.D., BCPS Clinical Pharmacist Pager [phone] 08/02/2016 1:28 PM

## 2016-08-02 NOTE — Progress Notes (Addendum)
Noted pt's Lt arm wrist area swollen, no redness or open area  noted while receiving iv vancomycin.  IV turned off and cold pack applied and elevate on pillow.  Notified Dr. Maylene Roes. Via text.  Will continue to monitor.  Karie Kirks, Therapist, sports.

## 2016-08-02 NOTE — Progress Notes (Signed)
Admit: 07/31/2016 LOS: 1  72F progressive CKD5, hypoxia 2/2 PNA, new finding presumed RUL Lung Cancer  Subjective:  Pt alone in room, w/o complaint Discussed:  Likely lung cancer, I'm not sure what if any therapies she would tolerate or benefit  CKD5 with some confusion; perhaps mild anemia.  Discussed nature of dialysis and adverse effects including wash-out/fatigue.  I expressed I don't know if she thinks it would be 'worth it'  Discussed her other serious lung / heart disease  She values being at home with her family as top prioritiy  Discussed how home hospice might be solution most aligned with her values  Labs stable.     02/15 0701 - 02/16 0700 In: 720 [P.O.:720] Out: 500 [Urine:500]  Filed Weights   07/31/16 1922 08/01/16 0243 08/02/16 0507  Weight: 66.7 kg (147 lb) 65.4 kg (144 lb 1.6 oz) 65.6 kg (144 lb 11.2 oz)    Scheduled Meds: . amLODipine  10 mg Oral Daily  . aspirin  81 mg Oral BID  . atorvastatin  20 mg Oral q1800  . carvedilol  25 mg Oral BID WC  . ceFEPime (MAXIPIME) IV  500 mg Intravenous Q24H  . feeding supplement  1 Container Oral BID WC  . feeding supplement (GLUCERNA SHAKE)  237 mL Oral Q24H  . guaiFENesin  600 mg Oral BID  . heparin subcutaneous  5,000 Units Subcutaneous Q8H  . insulin aspart  0-9 Units Subcutaneous TID WC  . isosorbide mononitrate  60 mg Oral Daily  . ranolazine  500 mg Oral BID  . sodium chloride flush  3 mL Intravenous Q12H  . sodium chloride flush  3 mL Intravenous Q12H   Continuous Infusions: PRN Meds:.sodium chloride, acetaminophen **OR** acetaminophen, HYDROcodone-acetaminophen, ipratropium, levalbuterol, ondansetron **OR** ondansetron (ZOFRAN) IV, sodium chloride flush  Current Labs: reviewed  Renal US 2/15: R 8cm L 8.3cm, increase echogenicity; no obstruction  Physical Exam:  Blood pressure (!) 141/51, pulse 66, temperature 98.4 F (36.9 C), temperature source Oral, resp. rate 18, height 5\' 2"  (1.575 m), weight  65.6 kg (144 lb 11.2 oz), SpO2 95 %. NAD, chronically ill appearing RRR Diminished bs in abses, prolonged Exp phase RRR No LEE  A 1. CKD5 with mild uremia 2. Spiculatied RUL mass, likely lung cancer 3. Hypoxia, treating  For multifocal PNA 4. sCHF 5. Hx/o CVA, CAD 6. Hx/o hemorragic CVA 2012 7. Hx/o PUD 8. HTN  P 1. Have d/w Dr. Domingo Cocking who will try to connect with pt / family 2. No HD is currently indicated; I don't believe that HD will restore her to the health she desires; cont to discuss with family 3. Will continue to follow 4. Daily weights, Daily Renal Panel, Strict I/Os, Avoid nephrotoxins (NSAIDs, judicious IV Contrast)    Pearson Grippe MD 08/02/2016, 10:01 AM   Recent Labs Lab 07/31/16 1935 07/31/16 2026 08/01/16 0413 08/02/16 0527  NA 134* 137 138 137  K 4.2 4.2 4.1 4.1  CL 100* 101 104 102  CO2 20*  --  19* 19*  GLUCOSE 147* 143* 91 105*  BUN 97* 94* 93* 94*  CREATININE 7.10* 7.50* 6.93* 6.61*  CALCIUM 6.8*  --  6.5* 6.7*  PHOS  --   --  5.5*  --     Recent Labs Lab 07/31/16 1935 07/31/16 2026 08/01/16 0413 08/02/16 0527  WBC 11.6*  --  10.0 9.7  NEUTROABS 10.0*  --   --  7.7  HGB 10.3* 11.2* 9.2* 9.2*  HCT 32.0*  33.0* 29.1* 29.0*  MCV 83.6  --  83.9 83.8  PLT 341  --  297 307

## 2016-08-02 NOTE — Progress Notes (Signed)
Pt slept well during the night, Vitals stable, no any sign of SOB and distress noted, no any complain of pain, will continue to monitor the patient. 

## 2016-08-02 NOTE — Progress Notes (Signed)
Physical Therapy Treatment Patient Details Name: [name] MRN: [id number] DOB: [birth year] Today's Date: 08/02/2016    History of Present Illness  82 y.o. female with admission for acute respiratory failure, has lung mass and not decided how to proceed, has UTI and HCAP, elevated troponin, recent NSTEMI.  PMHx: CKD 5, HTN, MI, CVA 2008 and 2012, CAD, CHF.     PT Comments    Patient ambulated short distance in room and declined further mobility stating "I just want to think about things". Pt is clearly upset about recent diagnosis. Therapist encouraged pt to ambulate with nursing staff later today. pt does continue to demo decreased awareness of safety/deficits. Continue to progress as tolerated with anticipated d/c home with HHPT with 24 hour supervision/assistance at least initially.    Follow Up Recommendations  Home health PT;Supervision/Assistance - 24 hour     Equipment Recommendations  None recommended by PT    Recommendations for Other Services       Precautions / Restrictions Precautions Precautions: Fall (telemetry) Restrictions Weight Bearing Restrictions: No    Mobility  Bed Mobility Overal bed mobility: Needs Assistance Bed Mobility: Supine to Sit;Sit to Supine     Supine to sit: Supervision Sit to supine: Supervision   General bed mobility comments: increased time/effort  Transfers Overall transfer level: Needs assistance Equipment used: Rolling walker (2 wheeled);1 person hand held assist Transfers: Sit to/from [facility] Sit to Stand: Min guard         General transfer comment: min guard for safety from commode and EOB; cues for hand placement and safety; pt needed cues for safe descent to sitting surface   Ambulation/Gait Ambulation/Gait assistance: Min guard Ambulation Distance (Feet):  (8ft X2) Assistive device: Rolling walker (2 wheeled);1 person hand held assist Gait Pattern/deviations: Step-through pattern;Trunk  flexed;Narrow base of support;Shuffle;Decreased stride length Gait velocity: reduced   General Gait Details: cues for posture and proximity of RW   Stairs            Wheelchair Mobility    Modified Rankin (Stroke Patients Only)       Balance Overall balance assessment: Needs assistance Sitting-balance support: Feet supported Sitting balance-Leahy Scale: Good     Standing balance support: Bilateral upper extremity supported Standing balance-Leahy Scale: Poor                      Cognition Arousal/Alertness: Awake/alert Behavior During Therapy: Flat affect Overall Cognitive Status: History of cognitive impairments - at baseline                 General Comments: pt appeared depressed about recent diagnosis    Exercises      General Comments General comments (skin integrity, edema, etc.): pt reported she does not always wear O2 at home and took O2 to go to bathroom; SpO2 84% on RA with trip to bathroom and back to bed; pt educated on need for O2 even to the bathroom      Pertinent Vitals/Pain Pain Assessment: No/denies pain    Home Living                      Prior Function            PT Goals (current goals can now be found in the care plan section) Acute Rehab PT Goals Patient Stated Goal: none stated PT Goal Formulation: With patient Time For Goal Achievement: 08/15/16 Potential to Achieve Goals: Good Progress towards PT  goals: Progressing toward goals    Frequency    Min 3X/week      PT Plan Current plan remains appropriate    Co-evaluation             End of Session Equipment Utilized During Treatment: Gait belt Activity Tolerance: Patient tolerated treatment well;Patient limited by fatigue Patient left: in bed;with call bell/phone within reach (bed alarm not activated but bed is broken, nsg aware)     Time: [id number] PT Time Calculation (min) (ACUTE ONLY): 32 min  Charges:  $Gait Training: 8-22  mins $Therapeutic Activity: 8-22 mins                    G Codes:      Salina April, PTA Pager: [phone]   08/02/2016, 11:27 AM

## 2016-08-02 NOTE — Progress Notes (Deleted)
Pt says blindly sign some papers for NH and have more questions.  Ask Judson Roch , SW to come and speak with pt and her son at bed side [name],  SW came and spoke with pt and her son.  Karie Kirks, Therapist, sports.

## 2016-08-03 DIAGNOSIS — Z7189 Other specified counseling: Secondary | ICD-10-CM

## 2016-08-03 DIAGNOSIS — Z515 Encounter for palliative care: Secondary | ICD-10-CM

## 2016-08-03 LAB — GLUCOSE, CAPILLARY
GLUCOSE-CAPILLARY: 117 mg/dL — AB (ref 65–99)
GLUCOSE-CAPILLARY: 78 mg/dL (ref 65–99)
Glucose-Capillary: 160 mg/dL — ABNORMAL HIGH (ref 65–99)
Glucose-Capillary: 126 mg/dL — ABNORMAL HIGH (ref 65–99)

## 2016-08-03 LAB — BASIC METABOLIC PANEL
Anion gap: 14 (ref 5–15)
BUN: 102 mg/dL — AB (ref 6–20)
CHLORIDE: 104 mmol/L (ref 101–111)
CO2: 18 mmol/L — ABNORMAL LOW (ref 22–32)
CREATININE: 6.45 mg/dL — AB (ref 0.44–1.00)
Calcium: 6.6 mg/dL — ABNORMAL LOW (ref 8.9–10.3)
GFR calc Af Amer: 6 mL/min — ABNORMAL LOW (ref >60)
GFR calc non Af Amer: 5 mL/min — ABNORMAL LOW (ref >60)
GLUCOSE: 119 mg/dL — AB (ref 65–99)
Potassium: 4.1 mmol/L (ref 3.5–5.1)
SODIUM: 136 mmol/L (ref 135–145)

## 2016-08-03 LAB — CULTURE, BLOOD (ROUTINE X 2)

## 2016-08-03 LAB — URINE CULTURE

## 2016-08-03 LAB — CBC WITH DIFFERENTIAL/PLATELET
Basophils Absolute: 0 10*3/uL (ref 0.0–0.1)
Basophils Relative: 0 %
EOS ABS: 0.2 10*3/uL (ref 0.0–0.7)
EOS PCT: 3 %
HCT: 21.6 % — ABNORMAL LOW (ref 36.0–46.0)
Hemoglobin: 7.7 g/dL — ABNORMAL LOW (ref 12.0–15.0)
LYMPHS ABS: 0.8 10*3/uL (ref 0.7–4.0)
Lymphocytes Relative: 11 %
MCH: 30.1 pg (ref 26.0–34.0)
MCHC: 35.6 g/dL (ref 30.0–36.0)
MCV: 84.4 fL (ref 78.0–100.0)
MONOS PCT: 8 %
Monocytes Absolute: 0.6 10*3/uL (ref 0.1–1.0)
Neutro Abs: 5.8 10*3/uL (ref 1.7–7.7)
Neutrophils Relative %: 78 %
PLATELETS: 239 10*3/uL (ref 150–400)
RBC: 2.56 MIL/uL — ABNORMAL LOW (ref 3.87–5.11)
RDW: 15.7 % — ABNORMAL HIGH (ref 11.5–15.5)
WBC: 7.5 10*3/uL (ref 4.0–10.5)

## 2016-08-03 MED ORDER — LEVOFLOXACIN 500 MG PO TABS
500.0000 mg | ORAL_TABLET | ORAL | Status: DC
  Administered 2016-08-03: 500 mg via ORAL
  Filled 2016-08-03 (×2): qty 1

## 2016-08-03 NOTE — Care Management Note (Signed)
Case Management Note  Patient Details  Name: [name] MRN: [id number] Date of Birth: [birth year]  Subjective/Objective:                  CKD stage V Action/Plan: Discharge planning Expected Discharge Date:                  Expected Discharge Plan:  Home w Hospice Care  In-House Referral:     Discharge planning Services  CM Consult  Post Acute Care Choice:    Choice offered to:  Patient  DME Arranged:  Overbed table DME Agency:  Other - Comment  HH Arranged:    [facility]  Status of Service:  In process, will continue to follow  If discussed at Long Length of Stay Meetings, dates discussed:    Additional Comments: CM met with pt and pt's spouse, [name] [phone] for choice of home hospice and family choose [facility].  Referral called to Bambi who is calling [name] today and planning for discharge Sunday 08/04/16.  Pt will need ambulance transport home and a discharge summary from discharging physician.  CM will continue to follow. Dellie Catholic, RN 08/03/2016, 2:57 PM

## 2016-08-03 NOTE — Progress Notes (Signed)
PROGRESS NOTE    [name]  [id number] DOB: May 07, 1935 DOA: 07/31/2016 PCP: Lynne Logan, MD     Brief Narrative:  [name] is a 82 y.o. female with medical history significant of CKD stage V, chronic systolic heart failure recent NSTEMI, remote CVA in 2008 and then again in 2012 hemorrhagic stroke, history of peptic ulcer disease, HTN, remote history of GI bleed, systolic heart failure who presents with cough, worsening SOB. PCP called her and told her that her potassium was 7.3 and that she would require dialysis. On arrival to ED , potassium was 4.3. In reviewing last admission notes, she had previously refused dialysis. However willing to pursue dialysis at this point.    Assessment & Plan:   Active Problems:   HYPERTENSION, BENIGN ESSENTIAL, LABILE   CAP (community acquired pneumonia)   CKD (chronic kidney disease) stage 5, GFR less than 15 ml/min (HCC)   Acute respiratory failure (HCC)   UTI (urinary tract infection)   HCAP (healthcare-associated pneumonia)   Lung mass   Malnutrition of moderate degree  CKD stage V -Nephrology is following -Long discussion regarding goals of care as below   Acute hypoxemic respiratory failure due to multilobar HCAP, unknown organism  -Blood culture with 1 of 2 coag neg staph, likely contaminant -Sputum culture pending  -Deescalate antibiotics to levaquin  -[place] O2 for now - wean as able   Right spiculated lung mass -Patient and son are quite poor historians. Initially they had told me that patient had had lung cancer in the past. I cannot find any records of this. Now she tells me that she has never had lung cancer, but had scalp cancer in the past. -Long discussion regarding goals of care as below   E Coli UTI, POA -Pansensitive -Continue levaquin   HTN -Continue norvasc, coreg  HLD -Continue lipitor   CAD -Continue aspirin, imdur, ranexa  -Recent NSTEMI 05/2016, decided against further investigations and  work up at that time   Chronic systolic heart failure -Last admission, discharged with demadex. Hold for now due to worsening CKD   AAA -Mild aneurysmal dilatation of the proximal abdominal aorta, measuring up to 3.3 cm in AP dimension. Recommend followup by ultrasound in 3 years.   Goals of care -Previously was DNR with enrollment in hospice. However, in discussion with patient and son, she is to be DO NOT INTUBATE, but would like to pursue CPR, defib, meds, if her heart were to stop. -2/15: Patient's son interestingly mentions that he would like patient to start dialysis then enroll in hospice when she is discharged. We discussed the purpose of dialysis as well as what hospice offers. He at this time would want dialysis for patient. Palliative care was consulted at time of admission, will leave order. I think it would benefit for family to have multiple conversations about goals of care. -2/16: Patient continues to defer all decisions to son. I had a long conversation with son over the phone this morning. We discussed quality of life vs pursuing aggressive measures such as biopsy of lung mass and potential treatment as well as HD and the lack of quality of life she would have. We discussed that just because we can do these medical therapies, does not necessarily mean we should put her through them, if her goal is to be comfortable at home without being in and out of hospital with various procedures. We discussed that these things would prolong suffering without necessarily bringing joy to her  life. He seemed more willing to meet with Palliative care. Discussed with Dr. Domingo Cocking. Tentative family meeting 2/17 2pm.  -2/17: Patient today states that all she wants is to go home to see her grandchildren. She seems quite depressed. Unclear if she is interested in hospice as she states that she had them before but refused further follow up as they told her she would die in July. Family meeting planned for  today afternoon with son and palliative care.    DVT prophylaxis: subq hep  Code Status: PARTIAL CODE. DO NOT INTUBATE.  Family Communication: son over the phone on 2/16  Disposition Plan: pending further conversation for goals of care    Consultants:   Nephrology  Palliative care   Procedures:   None  Antimicrobials:  Anti-infectives    Start     Dose/Rate Route Frequency Ordered Stop   08/03/16 0800  levofloxacin (LEVAQUIN) tablet 500 mg     500 mg Oral Every 48 hours 08/03/16 0743     08/02/16 1400  vancomycin (VANCOCIN) IVPB 1000 mg/200 mL premix  Status:  Discontinued     1,000 mg 200 mL/hr over 60 Minutes Intravenous Every 48 hours 08/02/16 1329 08/03/16 0719   08/01/16 0245  ceFEPIme (MAXIPIME) 500 mg in dextrose 5 % 50 mL IVPB  Status:  Discontinued     500 mg 100 mL/hr over 30 Minutes Intravenous Every 24 hours 08/01/16 0233 08/03/16 0719   07/31/16 2215  vancomycin (VANCOCIN) IVPB 1000 mg/200 mL premix     1,000 mg 200 mL/hr over 60 Minutes Intravenous  Once 07/31/16 2210 08/01/16 0122   07/31/16 2100  ceFEPIme (MAXIPIME) 1 g in dextrose 5 % 50 mL IVPB     1 g 100 mL/hr over 30 Minutes Intravenous  Once 07/31/16 2022 07/31/16 2150        Subjective: No new complaints. Breathing seems stable. Cough is decreased    Objective: Vitals:   08/02/16 1003 08/02/16 2040 08/03/16 0602 08/03/16 0902  BP: (!) 124/45 (!) 113/48 (!) 137/42 (!) 120/48  Pulse:  67 71   Resp:  18 18   Temp:  98 F (36.7 C) 98 F (36.7 C)   TempSrc:  Oral Oral   SpO2:  93% 95%   Weight:   66.6 kg (146 lb 14.4 oz)   Height:        Intake/Output Summary (Last 24 hours) at 08/03/16 1013 Last data filed at 08/03/16 0908  Gross per 24 hour  Intake              997 ml  Output              900 ml  Net               97 ml   Filed Weights   08/01/16 0243 08/02/16 0507 08/03/16 0602  Weight: 65.4 kg (144 lb 1.6 oz) 65.6 kg (144 lb 11.2 oz) 66.6 kg (146 lb 14.4 oz)    Examination:   General exam: Appears calm and comfortable  Respiratory system: Diminished lung sounds bilaterally. Respiratory effort normal. On [place] O2  Cardiovascular system: S1 & S2 heard, RRR. No JVD, murmurs, rubs, gallops or clicks. No pedal edema. Gastrointestinal system: Abdomen is nondistended, soft and nontender. No organomegaly or masses felt. Normal bowel sounds heard. Central nervous system: Alert and oriented x3. No focal neurological deficits. Extremities: Symmetric 5 x 5 power. Skin: No rashes, lesions or ulcers Psychiatry: Judgement and insight appear normal.  Poor historian, oriented x 3   Data Reviewed: I have personally reviewed following labs and imaging studies  CBC:  Recent Labs Lab 07/31/16 1935 07/31/16 2026 08/01/16 0413 08/02/16 0527 08/03/16 0540  WBC 11.6*  --  10.0 9.7 7.5  NEUTROABS 10.0*  --   --  7.7 5.8  HGB 10.3* 11.2* 9.2* 9.2* 7.7*  HCT 32.0* 33.0* 29.1* 29.0* 21.6*  MCV 83.6  --  83.9 83.8 84.4  PLT 341  --  297 307 [id number]   Basic Metabolic Panel:  Recent Labs Lab 07/31/16 1935 07/31/16 2026 08/01/16 0413 08/02/16 0527 08/03/16 0540  NA 134* 137 138 137 136  K 4.2 4.2 4.1 4.1 4.1  CL 100* 101 104 102 104  CO2 20*  --  19* 19* 18*  GLUCOSE 147* 143* 91 105* 119*  BUN 97* 94* 93* 94* 102*  CREATININE 7.10* 7.50* 6.93* 6.61* 6.45*  CALCIUM 6.8*  --  6.5* 6.7* 6.6*  MG  --   --  1.8  --   --   PHOS  --   --  5.5*  --   --    GFR: Estimated Creatinine Clearance: 6.3 mL/min (by C-G formula based on SCr of 6.45 mg/dL (H)). Liver Function Tests:  Recent Labs Lab 07/31/16 1935 08/01/16 0413  AST 13* 9*  ALT 11* 10*  ALKPHOS 62 54  BILITOT 0.9 0.9  PROT 6.9 6.1*  ALBUMIN 3.0* 2.6*   No results for input(s): LIPASE, AMYLASE in the last 168 hours. No results for input(s): AMMONIA in the last 168 hours. Coagulation Profile: No results for input(s): INR, PROTIME in the last 168 hours. Cardiac Enzymes:  Recent Labs Lab 08/01/16 0413  08/01/16 0753  TROPONINI 0.10* <0.03   BNP (last 3 results) No results for input(s): PROBNP in the last 8760 hours. HbA1C:  Recent Labs  08/01/16 0413  HGBA1C 5.1   CBG:  Recent Labs Lab 08/02/16 0738 08/02/16 1150 08/02/16 1708 08/02/16 2148 08/03/16 0739  GLUCAP 109* 142* 112* 135* 117*   Lipid Profile: No results for input(s): CHOL, HDL, LDLCALC, TRIG, CHOLHDL, LDLDIRECT in the last 72 hours. Thyroid Function Tests:  Recent Labs  08/01/16 0413  TSH 0.392   Anemia Panel:  Recent Labs  08/01/16 1530  FERRITIN 263  TIBC 214*  IRON 13*   Sepsis Labs:  Recent Labs Lab 07/31/16 2223 08/01/16 0125  LATICACIDVEN 0.62 0.80    Recent Results (from the past 240 hour(s))  Culture, Urine     Status: Abnormal   Collection Time: 07/31/16  2:52 PM  Result Value Ref Range Status   Specimen Description URINE, RANDOM  Final   Special Requests NONE  Final   Culture >=100,000 COLONIES/mL ESCHERICHIA COLI (A)  Final   Report Status 08/03/2016 FINAL  Final   Organism ID, Bacteria ESCHERICHIA COLI (A)  Final      Susceptibility   Escherichia coli - MIC*    AMPICILLIN 16 INTERMEDIATE Intermediate     CEFAZOLIN <=4 SENSITIVE Sensitive     CEFTRIAXONE <=1 SENSITIVE Sensitive     CIPROFLOXACIN <=0.25 SENSITIVE Sensitive     GENTAMICIN <=1 SENSITIVE Sensitive     IMIPENEM <=0.25 SENSITIVE Sensitive     NITROFURANTOIN <=16 SENSITIVE Sensitive     TRIMETH/SULFA >=320 RESISTANT Resistant     AMPICILLIN/SULBACTAM <=2 SENSITIVE Sensitive     PIP/TAZO <=4 SENSITIVE Sensitive     Extended ESBL NEGATIVE Sensitive     * >=100,000 COLONIES/mL ESCHERICHIA COLI  Blood culture (routine x 2)     Status: Abnormal   Collection Time: 07/31/16  8:24 PM  Result Value Ref Range Status   Specimen Description BLOOD RIGHT ANTECUBITAL  Final   Special Requests BOTTLES DRAWN AEROBIC ONLY 5CC  Final   Culture  Setup Time   Final    GRAM POSITIVE COCCI IN CLUSTERS AEROBIC BOTTLE  ONLY CRITICAL RESULT CALLED TO, READ BACK BY AND VERIFIED WITH: L POWELL PHARMD 2230 08/01/16 A BROWNING    Culture (A)  Final    STAPHYLOCOCCUS SPECIES (COAGULASE NEGATIVE) THE SIGNIFICANCE OF ISOLATING THIS ORGANISM FROM A SINGLE SET OF BLOOD CULTURES WHEN MULTIPLE SETS ARE DRAWN IS UNCERTAIN. PLEASE NOTIFY THE MICROBIOLOGY DEPARTMENT WITHIN ONE WEEK IF SPECIATION AND SENSITIVITIES ARE REQUIRED.    Report Status 08/03/2016 FINAL  Final  Blood Culture ID Panel (Reflexed)     Status: Abnormal   Collection Time: 07/31/16  8:24 PM  Result Value Ref Range Status   Enterococcus species NOT DETECTED NOT DETECTED Final   Listeria monocytogenes NOT DETECTED NOT DETECTED Final   Staphylococcus species DETECTED (A) NOT DETECTED Final    Comment: Methicillin (oxacillin) resistant coagulase negative staphylococcus. Possible blood culture contaminant (unless isolated from more than one blood culture draw or clinical case suggests pathogenicity). No antibiotic treatment is indicated for blood  culture contaminants. CRITICAL RESULT CALLED TO, READ BACK BY AND VERIFIED WITH: L POWELL PHARMD 2230 08/01/16 A BROWNING    Staphylococcus aureus NOT DETECTED NOT DETECTED Final   Methicillin resistance DETECTED (A) NOT DETECTED Final    Comment: CRITICAL RESULT CALLED TO, READ BACK BY AND VERIFIED WITH: L POWELL PHARMD 2230 08/01/16 A BROWNING    Streptococcus species NOT DETECTED NOT DETECTED Final   Streptococcus agalactiae NOT DETECTED NOT DETECTED Final   Streptococcus pneumoniae NOT DETECTED NOT DETECTED Final   Streptococcus pyogenes NOT DETECTED NOT DETECTED Final   Acinetobacter baumannii NOT DETECTED NOT DETECTED Final   Enterobacteriaceae species NOT DETECTED NOT DETECTED Final   Enterobacter cloacae complex NOT DETECTED NOT DETECTED Final   Escherichia coli NOT DETECTED NOT DETECTED Final   Klebsiella oxytoca NOT DETECTED NOT DETECTED Final   Klebsiella pneumoniae NOT DETECTED NOT DETECTED Final    Proteus species NOT DETECTED NOT DETECTED Final   Serratia marcescens NOT DETECTED NOT DETECTED Final   Haemophilus influenzae NOT DETECTED NOT DETECTED Final   Neisseria meningitidis NOT DETECTED NOT DETECTED Final   Pseudomonas aeruginosa NOT DETECTED NOT DETECTED Final   Candida albicans NOT DETECTED NOT DETECTED Final   Candida glabrata NOT DETECTED NOT DETECTED Final   Candida krusei NOT DETECTED NOT DETECTED Final   Candida parapsilosis NOT DETECTED NOT DETECTED Final   Candida tropicalis NOT DETECTED NOT DETECTED Final  Blood culture (routine x 2)     Status: None (Preliminary result)   Collection Time: 07/31/16  8:37 PM  Result Value Ref Range Status   Specimen Description BLOOD LEFT ANTECUBITAL  Final   Special Requests BOTTLES DRAWN AEROBIC ONLY 5CC  Final   Culture NO GROWTH 2 DAYS  Final   Report Status PENDING  Incomplete       Radiology Studies: US Renal  Result Date: 08/01/2016 CLINICAL DATA:  Chronic kidney disease. EXAM: RENAL / URINARY TRACT ULTRASOUND COMPLETE COMPARISON:  None. FINDINGS: Right Kidney: Length: 7.9 cm. Increased echogenicity of renal parenchyma is noted. 1 cm simple cyst is noted. No mass or hydronephrosis visualized. Left Kidney: Length: 8.3 cm. Increased echogenicity of renal parenchyma is  noted. No mass or hydronephrosis visualized. Bladder: Appears normal for degree of bladder distention. IMPRESSION: Increased echogenicity of renal parenchyma is noted bilaterally consistent with medical renal disease. Bilateral renal atrophy is noted. No hydronephrosis or renal obstruction is noted. Electronically Signed   By: Marijo Conception, M.D.   On: 08/01/2016 18:36      Scheduled Meds: . amLODipine  10 mg Oral Daily  . aspirin  81 mg Oral BID  . atorvastatin  20 mg Oral q1800  . carvedilol  25 mg Oral BID WC  . feeding supplement  1 Container Oral BID WC  . feeding supplement (GLUCERNA SHAKE)  237 mL Oral Q24H  . guaiFENesin  600 mg Oral BID  .  heparin subcutaneous  5,000 Units Subcutaneous Q8H  . insulin aspart  0-9 Units Subcutaneous TID WC  . isosorbide mononitrate  60 mg Oral Daily  . levofloxacin  500 mg Oral Q48H  . ranolazine  500 mg Oral BID  . sodium chloride flush  3 mL Intravenous Q12H  . sodium chloride flush  3 mL Intravenous Q12H   Continuous Infusions:   LOS: 2 days    Time spent: 30 minutes   Dessa Phi, DO [facility] [email or web address] Password [facility] 08/03/2016, 10:13 AM

## 2016-08-03 NOTE — Progress Notes (Signed)
Report called to nurse Mitnz, re pt tx.  Karie Kirks, Therapist, sports.

## 2016-08-03 NOTE — Progress Notes (Signed)
Attempted to call report to Gwenlyn Perking, nurse to be receiving pt.  Informed that he was busy with a pt and will call me back as soon as he was done.  Karie Kirks, Therapist, sports.

## 2016-08-03 NOTE — Consult Note (Signed)
Consultation Note Date: 08/03/2016   Patient Name: [name]  DOB: 07-31-1934  MRN: [id number]  Age / Sex: 82 y.o., female  PCP: Donald Prose, MD Referring Physician: Shon Millet*  Reason for Consultation: Establishing goals of care  HPI/Patient Profile: 82 y.o. female  with past medical history of Kidney stage V, mixed heart failure, hemorrhagic and ischemic strokes, COPD, hypertension, and STEMI admitted on 07/31/2016 with respiratory failure, UTI, pneumonia, and new discovery of lung mass after being by her PCP for elevated potassium (repeat was normal in the ED).  She has continued to decline in regards to her renal function. In the past she had been clear about desire for DO NOT RESUSCITATE/DO NOT INTUBATE/no dialysis and had been enrolled in hospice for a period of time. Her hospice enrollment ended last June when she broke her hip and wanted pursue therapy for this.  Palliative consulted for goals of care.   Clinical Assessment and Goals of Care: I met today with [name], her son [name]), and her grandson.   We discussed clinical course as well as wishes moving forward in regard to advanced directives.  Concepts specific to code status and rehospitalization discussed.  We discussed difference between a aggressive medical intervention path and a palliative, comfort focused care path.  Values and goals of care important to patient and family were attempted to be elicited.  Concept of Hospice and Palliative Care were discussed.  Family reports that her physicians have been doing a good job explaining things to them. They understand her current situation and goal is to transition home with the support of hospice.  Questions and concerns addressed.   PMT will continue to support holistically.  No documented HPOA, but she reports her son helps with medical decision making  SUMMARY  OF RECOMMENDATIONS   - Home with hospice support. - Completed durable DNR  Code Status/Advance Care Planning:  DNR  Palliative Prophylaxis:   Bowel Regimen and Frequent Pain Assessment  Additional Recommendations (Limitations, Scope, Preferences):  Full Comfort Care  Psycho-social/Spiritual:   Desire for further Chaplaincy support:no  Additional Recommendations: Education on Hospice  Prognosis:   < 6 months  Discharge Planning: Home with Hospice      Primary Diagnoses: Present on Admission: . Acute respiratory failure ([place]) . CAP (community acquired pneumonia) . CKD (chronic kidney disease) stage 5, GFR less than 15 ml/min (HCC) . HYPERTENSION, BENIGN ESSENTIAL, LABILE . UTI (urinary tract infection) . HCAP (healthcare-associated pneumonia) . Lung mass   I have reviewed the medical record, interviewed the patient and family, and examined the patient. The following aspects are pertinent.  Past Medical History:  Diagnosis Date  . Anemia   . Arthritis   . CAD (coronary artery disease)   . CAD (coronary artery disease)    a. Stents in [facility] 2001 and cath 2008 with possible balloon or stent at [facility] (not available in [place]).   . Cancer ([place])    scalp  . Chronic combined systolic and diastolic CHF (congestive heart  failure) ([place])    a. EF 45-50% in 0254. b. Mixed systolic/diastolic - EF 27-06% by echo 2014, 35% by nuc.  . CKD (chronic kidney disease), stage IV ([place]) 08/29/2012  . COPD (chronic obstructive pulmonary disease) ([place])   . Depression   . Gastrointestinal hemorrhage    a. Pt reports she has h/o this many years ago, requiring 2 units of blood. Thinks it was in [place] but records not available. She does not know why this happened.  Marland Kitchen GERD (gastroesophageal reflux disease)   . H/O medication noncompliance    a. Per notes, due to cost.   . Headache(784.0)   . Hemorrhoid    a. Mild hemorrhoidal bleeding per previous DC note.  Marland Kitchen Hx of peptic  ulcer   . Hypertension   . Myocardial infarction   . Nephrolithiasis   . On home oxygen therapy    "use it only when I need it; not very much" (06/12/2016)  . Osteoarthritis, generalized   . Stroke [facility])    a. 2008: acute stroke in left centrum ovale. 06/2010: hemorrhagic stroke.   Social History   Social History  . Marital status: Divorced    Spouse name: N/A  . Number of children: N/A  . Years of education: N/A   Social History Main Topics  . Smoking status: Current Every Day Smoker    Packs/day: 0.12    Years: 60.00    Types: Cigarettes  . Smokeless tobacco: Never Used  . Alcohol use No  . Drug use: No  . Sexual activity: No   Other Topics Concern  . None   Social History Narrative   Graduate [employer]   Married '59-30 yrs/divorced   2 sons, '61 '62; 8 grandchildren   Works as Optometrist, Field seismologist   Retired-lives in her own home and her son lives with her.    Family History  Problem Relation Age of Onset  . Diabetes Mother   . Hypertension Mother   . Lung cancer Father   . Diabetes Sister   . Cancer Son     breast  . Lung cancer Son   . Diabetes Sister   . Lung cancer Brother    Scheduled Meds: . amLODipine  10 mg Oral Daily  . aspirin  81 mg Oral BID  . atorvastatin  20 mg Oral q1800  . carvedilol  25 mg Oral BID WC  . feeding supplement  1 Container Oral BID WC  . feeding supplement (GLUCERNA SHAKE)  237 mL Oral Q24H  . guaiFENesin  600 mg Oral BID  . heparin subcutaneous  5,000 Units Subcutaneous Q8H  . insulin aspart  0-9 Units Subcutaneous TID WC  . isosorbide mononitrate  60 mg Oral Daily  . levofloxacin  500 mg Oral Q48H  . ranolazine  500 mg Oral BID  . sodium chloride flush  3 mL Intravenous Q12H  . sodium chloride flush  3 mL Intravenous Q12H   Continuous Infusions: PRN Meds:.sodium chloride, acetaminophen **OR** acetaminophen, HYDROcodone-acetaminophen, ipratropium, levalbuterol, ondansetron **OR** ondansetron (ZOFRAN)  IV, sodium chloride flush Medications Prior to Admission:  Prior to Admission medications   Medication Sig Start Date End Date Taking? Authorizing Provider  amLODipine (NORVASC) 10 MG tablet Take 1 tablet (10 mg total) by mouth daily. 06/14/16  Yes Shanker Kristeen Mans, MD  aspirin 81 MG chewable tablet Chew 1 tablet (81 mg total) by mouth daily. Patient taking differently: Chew 81 mg by mouth 2 (two) times daily.  09/02/12  Yes Jacques Navy, MD  atorvastatin (LIPITOR) 20 MG tablet Take 1 tablet (20 mg total) by mouth daily at 6 PM. 06/14/16  Yes Shanker Levora Dredge, MD  carvedilol (COREG) 25 MG tablet Take 1 tablet (25 mg total) by mouth 2 (two) times daily with a meal. 06/14/16  Yes Shanker Levora Dredge, MD  isosorbide mononitrate (IMDUR) 60 MG 24 hr tablet Take 1 tablet (60 mg total) by mouth daily. 06/14/16  Yes Shanker Levora Dredge, MD  nitroGLYCERIN (NITROSTAT) 0.4 MG SL tablet Place 1 tablet (0.4 mg total) under the tongue every 5 (five) minutes as needed for chest pain. 06/14/16  Yes Shanker Levora Dredge, MD  Nutritional Supplements (FEEDING SUPPLEMENT, NEPRO CARB STEADY,) LIQD Take 237 mLs by mouth 2 (two) times daily between meals. 06/14/16  Yes Shanker Levora Dredge, MD  ranolazine (RANEXA) 500 MG 12 hr tablet Take 1 tablet (500 mg total) by mouth 2 (two) times daily. 06/14/16  Yes Shanker Levora Dredge, MD  torsemide (DEMADEX) 20 MG tablet Take 4 tablets (80 mg total) by mouth daily. 06/14/16  Yes Shanker Levora Dredge, MD   No Known Allergies Review of Systems Reports SOB.   Physical Exam General: Alert, awake, in no acute distress.  HEENT: No bruits, no goiter, no JVD Heart: Regular rate and rhythm. No murmur appreciated. Lungs: Diminished air movement, scattered rhonchi Abdomen: Soft, nontender, nondistended, positive bowel sounds.  Ext: No significant edema Skin: Warm and dry Neuro: Grossly intact, nonfocal.  Vital Signs: BP (!) 127/50 (BP Location: Right Arm)   Pulse (!) 58   Temp 98 F  (36.7 C) (Oral)   Resp 18   Ht 5\' 2"  (1.575 m)   Wt 66.6 kg (146 lb 14.4 oz) Comment: scale c  SpO2 98%   BMI 26.87 kg/m  Pain Assessment: No/denies pain   Pain Score: 0-No pain   SpO2: SpO2: 98 % O2 Device:SpO2: 98 % O2 Flow Rate: .O2 Flow Rate (L/min): 2 L/min  IO: Intake/output summary:  Intake/Output Summary (Last 24 hours) at 08/03/16 2031 Last data filed at 08/03/16 1700  Gross per 24 hour  Intake              517 ml  Output              500 ml  Net               17 ml    LBM: Last BM Date: 07/31/16 Baseline Weight: Weight: 66.7 kg (147 lb) Most recent weight: Weight: 66.6 kg (146 lb 14.4 oz) (scale c)     Palliative Assessment/Data:   Flowsheet Rows   Flowsheet Row Most Recent Value  Intake Tab  Referral Department  Hospitalist  Unit at Time of Referral  Cardiac/Telemetry Unit  Palliative Care Primary Diagnosis  Cardiac  Date Notified  08/01/16  Palliative Care Type  Return patient Palliative Care  Reason for referral  Clarify Goals of Care  Date of Admission  07/31/16  Date first seen by Palliative Care  08/03/16  # of days Palliative referral response time  2 Day(s)  # of days IP prior to Palliative referral  1  Clinical Assessment  Psychosocial & Spiritual Assessment  Palliative Care Outcomes     Time Total: 60 Greater than 50%  of this time was spent counseling and coordinating care related to the above assessment and plan.  Signed by: 08/05/16, MD   Please contact Palliative Medicine Team phone at [phone] for questions  and concerns.  For individual provider: See Shea Evans

## 2016-08-03 NOTE — Progress Notes (Signed)
Pt's sone informed that pt is transferring to [place] today.  Verbalized understanding.  Karie Kirks, Therapist, sports.

## 2016-08-03 NOTE — Progress Notes (Signed)
Admit: 07/31/2016 LOS: 2  55F progressive CKD5, hypoxia 2/2 PNA, new finding presumed RUL Lung Cancer  Subjective:  No new events Family meeting this afternoon Pt states she just wants to be around her family / great grandkids SCr stable  02/16 0701 - 02/17 0700 In: 1200 [P.O.:900; IV Piggyback:300] Out: 900 [Urine:900]  Filed Weights   08/01/16 0243 08/02/16 0507 08/03/16 0602  Weight: 65.4 kg (144 lb 1.6 oz) 65.6 kg (144 lb 11.2 oz) 66.6 kg (146 lb 14.4 oz)    Scheduled Meds: . amLODipine  10 mg Oral Daily  . aspirin  81 mg Oral BID  . atorvastatin  20 mg Oral q1800  . carvedilol  25 mg Oral BID WC  . feeding supplement  1 Container Oral BID WC  . feeding supplement (GLUCERNA SHAKE)  237 mL Oral Q24H  . guaiFENesin  600 mg Oral BID  . heparin subcutaneous  5,000 Units Subcutaneous Q8H  . insulin aspart  0-9 Units Subcutaneous TID WC  . isosorbide mononitrate  60 mg Oral Daily  . levofloxacin  500 mg Oral Q48H  . ranolazine  500 mg Oral BID  . sodium chloride flush  3 mL Intravenous Q12H  . sodium chloride flush  3 mL Intravenous Q12H   Continuous Infusions: PRN Meds:.sodium chloride, acetaminophen **OR** acetaminophen, HYDROcodone-acetaminophen, ipratropium, levalbuterol, ondansetron **OR** ondansetron (ZOFRAN) IV, sodium chloride flush  Current Labs: reviewed  Renal US 2/15: R 8cm L 8.3cm, increase echogenicity; no obstruction  Physical Exam:  Blood pressure (!) 120/48, pulse 71, temperature 98 F (36.7 C), temperature source Oral, resp. rate 18, height 5\' 2"  (1.575 m), weight 66.6 kg (146 lb 14.4 oz), SpO2 95 %. NAD, chronically ill appearing RRR Diminished bs in abses, prolonged Exp phase RRR No LEE  A 1. CKD5 with mild uremia 2. Spiculatied RUL mass, likely lung cancer 3. Hypoxia, treating  For multifocal PNA -- now on RA this AM 4. sCHF 5. Hx/o CVA, CAD 6. Hx/o hemorragic CVA 2012 7. Hx/o PUD 8. HTN 9. Anemia  P 1. F/u after family meeting with  palliative medicine 2. No HD is currently indicated; I don't believe that HD will restore her to the health she desires; cont to discuss with family 3. Will continue to follow 4. Daily weights, Daily Renal Panel, Strict I/Os, Avoid nephrotoxins (NSAIDs, judicious IV Contrast)    Pearson Grippe MD 08/03/2016, 10:34 AM   Recent Labs Lab 08/01/16 0413 08/02/16 0527 08/03/16 0540  NA 138 137 136  K 4.1 4.1 4.1  CL 104 102 104  CO2 19* 19* 18*  GLUCOSE 91 105* 119*  BUN 93* 94* 102*  CREATININE 6.93* 6.61* 6.45*  CALCIUM 6.5* 6.7* 6.6*  PHOS 5.5*  --   --     Recent Labs Lab 07/31/16 1935  08/01/16 0413 08/02/16 0527 08/03/16 0540  WBC 11.6*  --  10.0 9.7 7.5  NEUTROABS 10.0*  --   --  7.7 5.8  HGB 10.3*  < > 9.2* 9.2* 7.7*  HCT 32.0*  < > 29.1* 29.0* 21.6*  MCV 83.6  --  83.9 83.8 84.4  PLT 341  --  297 307 239  < > = values in this interval not displayed.

## 2016-08-03 NOTE — Progress Notes (Signed)
Pharmacy Antibiotic Note  [name] is a 82 y.o. female admitted on 07/31/2016 with multilobar HCAP.  Pharmacy has been consulted for Levofloxacin dosing. Patient is CKD stage 5 not on HD, but possibly considering it now. SCr 6.45 and minimal UOP, estimated CrCl <10  Plan: -Levofloxacin 500mg  Q48H -F/u cultures, LOT  Height: 5\' 2"  (157.5 cm) Weight: 146 lb 14.4 oz (66.6 kg) (scale c) IBW/kg (Calculated) : 50.1  Temp (24hrs), Avg:98 F (36.7 C), Min:98 F (36.7 C), Max:98 F (36.7 C)   Recent Labs Lab 07/31/16 1935 07/31/16 2026 07/31/16 2223 08/01/16 0125 08/01/16 0413 08/02/16 0527 08/02/16 1059 08/03/16 0540  WBC 11.6*  --   --   --  10.0 9.7  --  7.5  CREATININE 7.10* 7.50*  --   --  6.93* 6.61*  --  6.45*  LATICACIDVEN  --   --  0.62 0.80  --   --   --   --   VANCORANDOM  --   --   --   --   --   --  12  --     Estimated Creatinine Clearance: 6.3 mL/min (by C-G formula based on SCr of 6.45 mg/dL (H)).    No Known Allergies  Antimicrobials this admission: Cefepime 2/14 >> 2/17 Vancomycin 2/14 >> 2/17 Levofloxacin 2/17 >>  Dose adjustments this admission: 2/16 Vanc Random = 12 (37 hours after last dose)  Microbiology results: 2/14 blood cx: 1/2 MRSE 2/14 urine >100k E. Coli 2/14 sputum: sent  Thank you for allowing pharmacy to be a part of this patient's care.  Myer Peer Grayland Ormond), PharmD  PGY1 Pharmacy Resident Pager: [phone] 08/03/2016 7:45 AM

## 2016-08-03 NOTE — Progress Notes (Signed)
Transferred patient from [address],placed in bed comfortably,alert and oriented x 4.Patient 's skin has abrasion and ecchymosis on her left upper arm.Left ankle black brown discoloration and her sacral area is pink but blanchable.

## 2016-08-03 NOTE — Progress Notes (Signed)
Dr. Maylene Roes made aware pt's H/H 7.7/21.6 today and also going to be transferred to 6E13 as we have an admission coming for airborne isolation.  Pt already made aware.  Verbalized understanding.  Karie Kirks, Therapist, sports.

## 2016-08-04 LAB — CBC WITH DIFFERENTIAL/PLATELET
BASOS ABS: 0 10*3/uL (ref 0.0–0.1)
Basophils Relative: 0 %
EOS ABS: 0.4 10*3/uL (ref 0.0–0.7)
EOS PCT: 5 %
HCT: 25.4 % — ABNORMAL LOW (ref 36.0–46.0)
HEMOGLOBIN: 8 g/dL — AB (ref 12.0–15.0)
LYMPHS ABS: 1 10*3/uL (ref 0.7–4.0)
Lymphocytes Relative: 12 %
MCH: 26.5 pg (ref 26.0–34.0)
MCHC: 31.5 g/dL (ref 30.0–36.0)
MCV: 84.1 fL (ref 78.0–100.0)
Monocytes Absolute: 0.7 10*3/uL (ref 0.1–1.0)
Monocytes Relative: 9 %
NEUTROS PCT: 74 %
Neutro Abs: 6.4 10*3/uL (ref 1.7–7.7)
PLATELETS: 284 10*3/uL (ref 150–400)
RBC: 3.02 MIL/uL — AB (ref 3.87–5.11)
RDW: 15.6 % — ABNORMAL HIGH (ref 11.5–15.5)
WBC: 8.5 10*3/uL (ref 4.0–10.5)

## 2016-08-04 LAB — BASIC METABOLIC PANEL
ANION GAP: 15 (ref 5–15)
BUN: 104 mg/dL — AB (ref 6–20)
CHLORIDE: 103 mmol/L (ref 101–111)
CO2: 16 mmol/L — ABNORMAL LOW (ref 22–32)
Calcium: 6.9 mg/dL — ABNORMAL LOW (ref 8.9–10.3)
Creatinine, Ser: 6.35 mg/dL — ABNORMAL HIGH (ref 0.44–1.00)
GFR, EST AFRICAN AMERICAN: 6 mL/min — AB (ref >60)
GFR, EST NON AFRICAN AMERICAN: 6 mL/min — AB (ref >60)
Glucose, Bld: 92 mg/dL (ref 65–99)
POTASSIUM: 4.1 mmol/L (ref 3.5–5.1)
SODIUM: 134 mmol/L — AB (ref 135–145)

## 2016-08-04 LAB — GLUCOSE, CAPILLARY
GLUCOSE-CAPILLARY: 112 mg/dL — AB (ref 65–99)
GLUCOSE-CAPILLARY: 156 mg/dL — AB (ref 65–99)

## 2016-08-04 MED ORDER — LEVOFLOXACIN 500 MG PO TABS
500.0000 mg | ORAL_TABLET | ORAL | 0 refills | Status: AC
Start: 2016-08-05 — End: 2016-08-07

## 2016-08-04 NOTE — Discharge Summary (Addendum)
Physician Discharge Summary  [name] [id number] DOB: [birth year] DOA: 07/31/2016  PCP: Lynne Logan, MD  Admit date: 07/31/2016 Discharge date: 08/04/2016  Admitted From: Home Disposition:  Home hospice   Recommendations for Outpatient Follow-up:  1. Call PCP with questions as needed   Discharge Condition: Terminal, hospice CODE STATUS: DNR  Diet recommendation: General diet   Brief/Interim Summary: From H&P: Sarah Ciervo McDonaldis a 82 y.o.femalewith medical history significant of CKD stage V, chronic systolic heart failure recent NSTEMI, remote CVA in 2008 and then again in 2012 hemorrhagic stroke, history of peptic ulcer disease, HTN, remote history of GI bleed, systolic heart failure who presents with cough, worsening SOB. PCP called her and told her that her potassium was 7.3 and that she would require dialysis. On arrival to ED , potassium was 4.3. In reviewing last admission notes, she had previously refused dialysis. However willing to pursue dialysis at this point.   Interim: She was evaluated by Nephrology. Further conversations of goals of care were had by multiple physicians with patient as well as son (primary decision maker and next of kin). Ultimately, they have decided to enroll in hospice in order to provide comfort care rather than aggressive work up and management of new lung mass and CKD stage V. She was treated for HCAP and E Coli UTI with antibiotics while in hospital.   Subjective on day of discharge: Feeling well this morning. Breathing is so-so but states that it is not any worse. She has no new complaints and wants to go home.   Discharge Diagnoses:  Active Problems:   HYPERTENSION, BENIGN ESSENTIAL, LABILE   CAP (community acquired pneumonia)   CKD (chronic kidney disease) stage 5, GFR less than 15 ml/min (HCC)   Acute respiratory failure (HCC)   UTI (urinary tract infection)   HCAP (healthcare-associated pneumonia)   Lung mass   Malnutrition of  moderate degree  CKD stage V -Nephrology consulted as patient and son wanted to re-consider dialysis at time of admission. Appreciate Nephrology involvement.   -Long discussion regarding goals of care as below. Ultimately decided against undergoing dialysis.   Acute hypoxemic respiratory failure due to multilobar HCAP, unknown organism  -Blood culture with 1 of 2 coag neg staph, likely contaminant -Deescalate antibiotics to levaquin, total treatment for 7 days -Wean O2   Right spiculated lung mass -Long discussion regarding goals of care as below. Ultimately decided against biopsy and aggressive treatment   E Coli UTI, POA -Pansensitive -Continue levaquin, total treatment for 7 days   HTN HLD CAD Chronic systolic heart failure -Will stop PTA medications as patient now enrolled in hospice   AAA -No follow up as patient now enrolled in hospice   Goals of care -Previously was DNR with enrollment in hospice. However, in discussion with patient and son, she is to be DO NOT INTUBATE, but would like to pursue CPR, defib, meds, if her heart were to stop. -2/15: Patient's son interestingly mentions that he would like patient to start dialysis then enroll in hospice when she is discharged. We discussed the purpose of dialysis as well as what hospice offers. He at this time would want dialysis for patient. Palliative care was consulted at time of admission, will leave order. I think it would benefit for family to have multiple conversations about goals of care. -2/16: Patient continues to defer all decisions to son. I had a long conversation with son over the phone this morning. We discussed quality of life vs pursuing  aggressive measures such as biopsy of lung mass and potential treatment as well as HD and the lack of quality of life she would have. We discussed that just because we can do these medical therapies, does not necessarily mean we should put her through them, if her goal is to  be comfortable at home without being in and out of hospital with various procedures. We discussed that these things would prolong suffering without necessarily bringing joy to her life. He seemed more willing to meet with Palliative care. Discussed with Dr. Domingo Cocking. Tentative family meeting 2/17 2pm.  -2/17: Patient today states that all she wants is to go home to see her grandchildren. She seems quite depressed. Unclear if she is interested in hospice as she states that she had them before but refused further follow up as they told her she would die in July. Family meeting planned for today afternoon with son and palliative care. Discussed with Dr. Domingo Cocking. Patient and family have elected for hospice at this time.   Discharge Instructions  Discharge Instructions    Diet general    Complete by:  As directed    Increase activity slowly    Complete by:  As directed      Allergies as of 08/04/2016   No Known Allergies     Medication List    STOP taking these medications   amLODipine 10 MG tablet Commonly known as:  NORVASC   aspirin 81 MG chewable tablet   atorvastatin 20 MG tablet Commonly known as:  LIPITOR   carvedilol 25 MG tablet Commonly known as:  COREG   feeding supplement (NEPRO CARB STEADY) Liqd   isosorbide mononitrate 60 MG 24 hr tablet Commonly known as:  IMDUR   nitroGLYCERIN 0.4 MG SL tablet Commonly known as:  NITROSTAT   ranolazine 500 MG 12 hr tablet Commonly known as:  RANEXA   torsemide 20 MG tablet Commonly known as:  DEMADEX     TAKE these medications   levofloxacin 500 MG tablet Commonly known as:  LEVAQUIN Take 1 tablet (500 mg total) by mouth every other day. Start taking on:  08/05/2016      Follow-up Information    [facility] Follow up.   Specialty:  Hospice Services Why:  [facility] hosp[facility] home agency Contact information: [address] [place] [address] [phone]          No Known  Allergies  Consultations:  Nephrology  Palliative Care   Procedures/Studies: Dg Chest 2 View  Result Date: 07/31/2016 CLINICAL DATA:  Productive cough for 2 days. History of COPD and CHF. Smoker. EXAM: CHEST  2 VIEW COMPARISON:  06/12/2016 FINDINGS: Right upper lung mass measuring 3.2 x 3.6 cm in diameter. This is suspicious for primary neoplasm. CT chest recommended for further evaluation. Emphysematous changes in the lungs. Patchy areas of infiltration or atelectasis in the left mid and lower lung. This could represent superimposed pneumonia. No blunting of costophrenic angles. No pneumothorax. Calcified and tortuous aorta. Normal heart size and pulmonary vascularity. IMPRESSION: 3.6 cm diameter right upper lung mass. Likely to represent lung cancer. CT chest recommended. Patchy infiltration in the left mid and lower lung zones may represent pneumonia. Electronically Signed   By: Lucienne Capers M.D.   On: 07/31/2016 21:35   Ct Chest Wo Contrast  Result Date: 07/31/2016 CLINICAL DATA:  Concern for lung cancer on radiograph. Further evaluation requested. Initial encounter. EXAM: CT CHEST WITHOUT CONTRAST TECHNIQUE: Multidetector CT imaging of  the chest was performed following the standard protocol without IV contrast. COMPARISON:  Chest radiograph performed earlier today at 9:06 p.m. FINDINGS: Cardiovascular: The heart is borderline enlarged. Diffuse coronary artery calcifications are seen. Scattered calcification is noted along the thoracic aorta. There is mild aneurysmal dilatation of the descending thoracic aorta to 4.3 cm in maximal diameter. This resolves well above the level of the diaphragm. The great vessels are grossly unremarkable in appearance. Mediastinum/Nodes: A mildly enlarged 1.3 cm precarinal node is seen. No additional mediastinal lymphadenopathy is appreciated. No pericardial effusion is identified. The thyroid gland is grossly unremarkable. No axillary lymphadenopathy is seen.  Lungs/Pleura: A 4.0 x 2.8 x 2.6 cm spiculated mass is noted at the right upper lobe, compatible with primary bronchogenic malignancy. There is mild associated pleural thickening; this may simply reflect atelectasis, though pleural spread of disease cannot be entirely excluded. Mild lingular opacity and bilateral lower lobe opacities raise concern for pneumonia. No pleural effusion or pneumothorax is seen. No additional well-defined masses are identified, though evaluation for mass is limited given airspace opacification. Upper Abdomen: The visualized portions of the liver and spleen are grossly unremarkable. The patient is status post cholecystectomy, with clips noted at the gallbladder fossa. The visualized portions of the pancreas is and adrenal glands are within normal limits. Mild bilateral renal atrophy is noted. Scattered calcification is seen along the proximal abdominal aorta. There is mild aneurysmal dilatation of the proximal abdominal aorta, measuring up to 3.3 cm in AP dimension. Musculoskeletal: No acute osseous abnormalities are identified. Mild sclerosis is noted at the right glenohumeral joint, with joint space loss. The visualized musculature is unremarkable in appearance. IMPRESSION: 1. 4.0 x 2.8 x 2.6 cm spiculated mass at the right upper lobe, compatible with primary bronchogenic malignancy. Associated mild pleural thickening may simply reflect atelectasis, though pleural spread of disease cannot be entirely excluded. 2. Bilateral lower lobe airspace opacities and mild lingular opacity raise concern for multifocal pneumonia. 3. Mildly enlarged 1.3 cm precarinal node, nonspecific in appearance. 4. Borderline cardiomegaly. Diffuse coronary artery calcifications seen. 5. Mild bilateral renal atrophy noted. 6. Mild aneurysmal dilatation of the descending thoracic aorta to 4.3 cm in maximal diameter, which resolves well above the level of the diaphragm. Scattered aortic atherosclerosis. 7. **An  incidental finding of potential clinical significance has been found. Mild aneurysmal dilatation of the proximal abdominal aorta, measuring up to 3.3 cm in AP dimension. Recommend followup by ultrasound in 3 years. This recommendation follows ACR consensus guidelines: White Paper of the ACR Incidental Findings Committee II on Vascular Findings. Natasha Mead Coll Radiol [id number] ** Electronically Signed   By: Garald Balding M.D.   On: 07/31/2016 23:40   US Renal  Result Date: 08/01/2016 CLINICAL DATA:  Chronic kidney disease. EXAM: RENAL / URINARY TRACT ULTRASOUND COMPLETE COMPARISON:  None. FINDINGS: Right Kidney: Length: 7.9 cm. Increased echogenicity of renal parenchyma is noted. 1 cm simple cyst is noted. No mass or hydronephrosis visualized. Left Kidney: Length: 8.3 cm. Increased echogenicity of renal parenchyma is noted. No mass or hydronephrosis visualized. Bladder: Appears normal for degree of bladder distention. IMPRESSION: Increased echogenicity of renal parenchyma is noted bilaterally consistent with medical renal disease. Bilateral renal atrophy is noted. No hydronephrosis or renal obstruction is noted. Electronically Signed   By: Marijo Conception, M.D.   On: 08/01/2016 18:36       Discharge Exam: Vitals:   08/03/16 2058 08/04/16 0456  BP: (!) 136/32 (!) 134/50  Pulse: 60 63  Resp: 19 20  Temp: 97.9 F (36.6 C) 98.4 F (36.9 C)   Vitals:   08/03/16 0902 08/03/16 1806 08/03/16 2058 08/04/16 0456  BP: (!) 120/48 (!) 127/50 (!) 136/32 (!) 134/50  Pulse:  (!) 58 60 63  Resp:  18 19 20   Temp:  98 F (36.7 C) 97.9 F (36.6 C) 98.4 F (36.9 C)  TempSrc:  Oral Oral Oral  SpO2:  98% 98% 94%  Weight:   66.4 kg (146 lb 6.2 oz)   Height:        General: Pt is alert, awake, not in acute distress Cardiovascular: RRR, S1/S2 +, no rubs, no gallops Respiratory: CTA bilaterally, no wheezing, no rhonchi Abdominal: Soft, NT, ND, bowel sounds + Extremities: no edema, no cyanosis, bruising  on upper extremities     The results of significant diagnostics from this hospitalization (including imaging, microbiology, ancillary and laboratory) are listed below for reference.     Microbiology: Recent Results (from the past 240 hour(s))  Culture, Urine     Status: Abnormal   Collection Time: 07/31/16  2:52 PM  Result Value Ref Range Status   Specimen Description URINE, RANDOM  Final   Special Requests NONE  Final   Culture >=100,000 COLONIES/mL ESCHERICHIA COLI (A)  Final   Report Status 08/03/2016 FINAL  Final   Organism ID, Bacteria ESCHERICHIA COLI (A)  Final      Susceptibility   Escherichia coli - MIC*    AMPICILLIN 16 INTERMEDIATE Intermediate     CEFAZOLIN <=4 SENSITIVE Sensitive     CEFTRIAXONE <=1 SENSITIVE Sensitive     CIPROFLOXACIN <=0.25 SENSITIVE Sensitive     GENTAMICIN <=1 SENSITIVE Sensitive     IMIPENEM <=0.25 SENSITIVE Sensitive     NITROFURANTOIN <=16 SENSITIVE Sensitive     TRIMETH/SULFA >=320 RESISTANT Resistant     AMPICILLIN/SULBACTAM <=2 SENSITIVE Sensitive     PIP/TAZO <=4 SENSITIVE Sensitive     Extended ESBL NEGATIVE Sensitive     * >=100,000 COLONIES/mL ESCHERICHIA COLI  Blood culture (routine x 2)     Status: Abnormal   Collection Time: 07/31/16  8:24 PM  Result Value Ref Range Status   Specimen Description BLOOD RIGHT ANTECUBITAL  Final   Special Requests BOTTLES DRAWN AEROBIC ONLY 5CC  Final   Culture  Setup Time   Final    GRAM POSITIVE COCCI IN CLUSTERS AEROBIC BOTTLE ONLY CRITICAL RESULT CALLED TO, READ BACK BY AND VERIFIED WITH: L POWELL PHARMD 2230 08/01/16 A BROWNING    Culture (A)  Final    STAPHYLOCOCCUS SPECIES (COAGULASE NEGATIVE) THE SIGNIFICANCE OF ISOLATING THIS ORGANISM FROM A SINGLE SET OF BLOOD CULTURES WHEN MULTIPLE SETS ARE DRAWN IS UNCERTAIN. PLEASE NOTIFY THE MICROBIOLOGY DEPARTMENT WITHIN ONE WEEK IF SPECIATION AND SENSITIVITIES ARE REQUIRED.    Report Status 08/03/2016 FINAL  Final  Blood Culture ID Panel  (Reflexed)     Status: Abnormal   Collection Time: 07/31/16  8:24 PM  Result Value Ref Range Status   Enterococcus species NOT DETECTED NOT DETECTED Final   Listeria monocytogenes NOT DETECTED NOT DETECTED Final   Staphylococcus species DETECTED (A) NOT DETECTED Final    Comment: Methicillin (oxacillin) resistant coagulase negative staphylococcus. Possible blood culture contaminant (unless isolated from more than one blood culture draw or clinical case suggests pathogenicity). No antibiotic treatment is indicated for blood  culture contaminants. CRITICAL RESULT CALLED TO, READ BACK BY AND VERIFIED WITH: L POWELL PHARMD 2230 08/01/16 A BROWNING    Staphylococcus aureus  NOT DETECTED NOT DETECTED Final   Methicillin resistance DETECTED (A) NOT DETECTED Final    Comment: CRITICAL RESULT CALLED TO, READ BACK BY AND VERIFIED WITH: L POWELL PHARMD 2230 08/01/16 A BROWNING    Streptococcus species NOT DETECTED NOT DETECTED Final   Streptococcus agalactiae NOT DETECTED NOT DETECTED Final   Streptococcus pneumoniae NOT DETECTED NOT DETECTED Final   Streptococcus pyogenes NOT DETECTED NOT DETECTED Final   Acinetobacter baumannii NOT DETECTED NOT DETECTED Final   Enterobacteriaceae species NOT DETECTED NOT DETECTED Final   Enterobacter cloacae complex NOT DETECTED NOT DETECTED Final   Escherichia coli NOT DETECTED NOT DETECTED Final   Klebsiella oxytoca NOT DETECTED NOT DETECTED Final   Klebsiella pneumoniae NOT DETECTED NOT DETECTED Final   Proteus species NOT DETECTED NOT DETECTED Final   Serratia marcescens NOT DETECTED NOT DETECTED Final   Haemophilus influenzae NOT DETECTED NOT DETECTED Final   Neisseria meningitidis NOT DETECTED NOT DETECTED Final   Pseudomonas aeruginosa NOT DETECTED NOT DETECTED Final   Candida albicans NOT DETECTED NOT DETECTED Final   Candida glabrata NOT DETECTED NOT DETECTED Final   Candida krusei NOT DETECTED NOT DETECTED Final   Candida parapsilosis NOT DETECTED NOT  DETECTED Final   Candida tropicalis NOT DETECTED NOT DETECTED Final  Blood culture (routine x 2)     Status: None (Preliminary result)   Collection Time: 07/31/16  8:37 PM  Result Value Ref Range Status   Specimen Description BLOOD LEFT ANTECUBITAL  Final   Special Requests BOTTLES DRAWN AEROBIC ONLY 5CC  Final   Culture NO GROWTH 3 DAYS  Final   Report Status PENDING  Incomplete     Labs: BNP (last 3 results)  Recent Labs  06/12/16 0042  BNP [id number]*   Basic Metabolic Panel:  Recent Labs Lab 07/31/16 1935 07/31/16 2026 08/01/16 0413 08/02/16 0527 08/03/16 0540 08/04/16 0410  NA 134* 137 138 137 136 134*  K 4.2 4.2 4.1 4.1 4.1 4.1  CL 100* 101 104 102 104 103  CO2 20*  --  19* 19* 18* 16*  GLUCOSE 147* 143* 91 105* 119* 92  BUN 97* 94* 93* 94* 102* 104*  CREATININE 7.10* 7.50* 6.93* 6.61* 6.45* 6.35*  CALCIUM 6.8*  --  6.5* 6.7* 6.6* 6.9*  MG  --   --  1.8  --   --   --   PHOS  --   --  5.5*  --   --   --    Liver Function Tests:  Recent Labs Lab 07/31/16 1935 08/01/16 0413  AST 13* 9*  ALT 11* 10*  ALKPHOS 62 54  BILITOT 0.9 0.9  PROT 6.9 6.1*  ALBUMIN 3.0* 2.6*   No results for input(s): LIPASE, AMYLASE in the last 168 hours. No results for input(s): AMMONIA in the last 168 hours. CBC:  Recent Labs Lab 07/31/16 1935 07/31/16 2026 08/01/16 0413 08/02/16 0527 08/03/16 0540 08/04/16 0410  WBC 11.6*  --  10.0 9.7 7.5 8.5  NEUTROABS 10.0*  --   --  7.7 5.8 6.4  HGB 10.3* 11.2* 9.2* 9.2* 7.7* 8.0*  HCT 32.0* 33.0* 29.1* 29.0* 21.6* 25.4*  MCV 83.6  --  83.9 83.8 84.4 84.1  PLT 341  --  297 307 239 284   Cardiac Enzymes:  Recent Labs Lab 08/01/16 0413 08/01/16 0753  TROPONINI 0.10* <0.03   BNP: Invalid input(s): POCBNP CBG:  Recent Labs Lab 08/03/16 0739 08/03/16 1114 08/03/16 1612 08/03/16 2056 08/04/16 0735  GLUCAP 117*  160* 78 126* 112*   D-Dimer No results for input(s): DDIMER in the last 72 hours. Hgb A1c No results for  input(s): HGBA1C in the last 72 hours. Lipid Profile No results for input(s): CHOL, HDL, LDLCALC, TRIG, CHOLHDL, LDLDIRECT in the last 72 hours. Thyroid function studies No results for input(s): TSH, T4TOTAL, T3FREE, THYROIDAB in the last 72 hours.  Invalid input(s): FREET3 Anemia work up  Recent Labs  08/01/16 1530  FERRITIN 263  TIBC 214*  IRON 13*   Urinalysis    Component Value Date/Time   COLORURINE YELLOW 07/31/2016 1912   APPEARANCEUR CLOUDY (A) 07/31/2016 1912   LABSPEC 1.011 07/31/2016 1912   PHURINE 5.0 07/31/2016 1912   GLUCOSEU 50 (A) 07/31/2016 1912   HGBUR SMALL (A) 07/31/2016 1912   BILIRUBINUR NEGATIVE 07/31/2016 1912   KETONESUR NEGATIVE 07/31/2016 1912   PROTEINUR 30 (A) 07/31/2016 1912   UROBILINOGEN 1.0 02/20/2014 0455   NITRITE NEGATIVE 07/31/2016 1912   LEUKOCYTESUR LARGE (A) 07/31/2016 1912   Sepsis Labs Invalid input(s): PROCALCITONIN,  WBC,  LACTICIDVEN Microbiology Recent Results (from the past 240 hour(s))  Culture, Urine     Status: Abnormal   Collection Time: 07/31/16  2:52 PM  Result Value Ref Range Status   Specimen Description URINE, RANDOM  Final   Special Requests NONE  Final   Culture >=100,000 COLONIES/mL ESCHERICHIA COLI (A)  Final   Report Status 08/03/2016 FINAL  Final   Organism ID, Bacteria ESCHERICHIA COLI (A)  Final      Susceptibility   Escherichia coli - MIC*    AMPICILLIN 16 INTERMEDIATE Intermediate     CEFAZOLIN <=4 SENSITIVE Sensitive     CEFTRIAXONE <=1 SENSITIVE Sensitive     CIPROFLOXACIN <=0.25 SENSITIVE Sensitive     GENTAMICIN <=1 SENSITIVE Sensitive     IMIPENEM <=0.25 SENSITIVE Sensitive     NITROFURANTOIN <=16 SENSITIVE Sensitive     TRIMETH/SULFA >=320 RESISTANT Resistant     AMPICILLIN/SULBACTAM <=2 SENSITIVE Sensitive     PIP/TAZO <=4 SENSITIVE Sensitive     Extended ESBL NEGATIVE Sensitive     * >=100,000 COLONIES/mL ESCHERICHIA COLI  Blood culture (routine x 2)     Status: Abnormal   Collection  Time: 07/31/16  8:24 PM  Result Value Ref Range Status   Specimen Description BLOOD RIGHT ANTECUBITAL  Final   Special Requests BOTTLES DRAWN AEROBIC ONLY 5CC  Final   Culture  Setup Time   Final    GRAM POSITIVE COCCI IN CLUSTERS AEROBIC BOTTLE ONLY CRITICAL RESULT CALLED TO, READ BACK BY AND VERIFIED WITH: L POWELL PHARMD 2230 08/01/16 A BROWNING    Culture (A)  Final    STAPHYLOCOCCUS SPECIES (COAGULASE NEGATIVE) THE SIGNIFICANCE OF ISOLATING THIS ORGANISM FROM A SINGLE SET OF BLOOD CULTURES WHEN MULTIPLE SETS ARE DRAWN IS UNCERTAIN. PLEASE NOTIFY THE MICROBIOLOGY DEPARTMENT WITHIN ONE WEEK IF SPECIATION AND SENSITIVITIES ARE REQUIRED.    Report Status 08/03/2016 FINAL  Final  Blood Culture ID Panel (Reflexed)     Status: Abnormal   Collection Time: 07/31/16  8:24 PM  Result Value Ref Range Status   Enterococcus species NOT DETECTED NOT DETECTED Final   Listeria monocytogenes NOT DETECTED NOT DETECTED Final   Staphylococcus species DETECTED (A) NOT DETECTED Final    Comment: Methicillin (oxacillin) resistant coagulase negative staphylococcus. Possible blood culture contaminant (unless isolated from more than one blood culture draw or clinical case suggests pathogenicity). No antibiotic treatment is indicated for blood  culture contaminants. CRITICAL RESULT CALLED TO,  READ BACK BY AND VERIFIED WITH: L POWELL PHARMD 2230 08/01/16 A BROWNING    Staphylococcus aureus NOT DETECTED NOT DETECTED Final   Methicillin resistance DETECTED (A) NOT DETECTED Final    Comment: CRITICAL RESULT CALLED TO, READ BACK BY AND VERIFIED WITH: L POWELL PHARMD 2230 08/01/16 A BROWNING    Streptococcus species NOT DETECTED NOT DETECTED Final   Streptococcus agalactiae NOT DETECTED NOT DETECTED Final   Streptococcus pneumoniae NOT DETECTED NOT DETECTED Final   Streptococcus pyogenes NOT DETECTED NOT DETECTED Final   Acinetobacter baumannii NOT DETECTED NOT DETECTED Final   Enterobacteriaceae species NOT  DETECTED NOT DETECTED Final   Enterobacter cloacae complex NOT DETECTED NOT DETECTED Final   Escherichia coli NOT DETECTED NOT DETECTED Final   Klebsiella oxytoca NOT DETECTED NOT DETECTED Final   Klebsiella pneumoniae NOT DETECTED NOT DETECTED Final   Proteus species NOT DETECTED NOT DETECTED Final   Serratia marcescens NOT DETECTED NOT DETECTED Final   Haemophilus influenzae NOT DETECTED NOT DETECTED Final   Neisseria meningitidis NOT DETECTED NOT DETECTED Final   Pseudomonas aeruginosa NOT DETECTED NOT DETECTED Final   Candida albicans NOT DETECTED NOT DETECTED Final   Candida glabrata NOT DETECTED NOT DETECTED Final   Candida krusei NOT DETECTED NOT DETECTED Final   Candida parapsilosis NOT DETECTED NOT DETECTED Final   Candida tropicalis NOT DETECTED NOT DETECTED Final  Blood culture (routine x 2)     Status: None (Preliminary result)   Collection Time: 07/31/16  8:37 PM  Result Value Ref Range Status   Specimen Description BLOOD LEFT ANTECUBITAL  Final   Special Requests BOTTLES DRAWN AEROBIC ONLY 5CC  Final   Culture NO GROWTH 3 DAYS  Final   Report Status PENDING  Incomplete     Time coordinating discharge: 40 minutes  SIGNED:  Dessa Phi, DO [facility] Pager [phone]  If 7PM-7AM, please contact night-coverage [email or web address] Password [facility] 08/04/2016, 8:18 AM

## 2016-08-05 LAB — CULTURE, BLOOD (ROUTINE X 2): Culture: NO GROWTH

## 2016-08-13 ENCOUNTER — Encounter: Payer: Self-pay | Admitting: Internal Medicine

## 2017-07-29 ENCOUNTER — Encounter (HOSPITAL_COMMUNITY): Payer: Self-pay

## 2017-07-29 ENCOUNTER — Other Ambulatory Visit: Payer: Self-pay

## 2017-07-29 ENCOUNTER — Emergency Department (HOSPITAL_COMMUNITY)
Admission: EM | Admit: 2017-07-29 | Discharge: 2017-07-29 | Disposition: A | Discharge status: Home / Self Care | Payer: Medicare Other | Attending: Emergency Medicine | Admitting: Emergency Medicine

## 2017-07-29 ENCOUNTER — Emergency Department (HOSPITAL_COMMUNITY): Payer: Medicare Other

## 2017-07-29 DIAGNOSIS — R42 Dizziness and giddiness: Secondary | ICD-10-CM | POA: Diagnosis not present

## 2017-07-29 DIAGNOSIS — R112 Nausea with vomiting, unspecified: Secondary | ICD-10-CM | POA: Diagnosis not present

## 2017-07-29 DIAGNOSIS — Y939 Activity, unspecified: Secondary | ICD-10-CM | POA: Diagnosis not present

## 2017-07-29 DIAGNOSIS — R918 Other nonspecific abnormal finding of lung field: Secondary | ICD-10-CM | POA: Diagnosis not present

## 2017-07-29 DIAGNOSIS — I252 Old myocardial infarction: Secondary | ICD-10-CM | POA: Diagnosis not present

## 2017-07-29 DIAGNOSIS — S51012A Laceration without foreign body of left elbow, initial encounter: Secondary | ICD-10-CM | POA: Insufficient documentation

## 2017-07-29 DIAGNOSIS — N186 End stage renal disease: Secondary | ICD-10-CM | POA: Insufficient documentation

## 2017-07-29 DIAGNOSIS — R05 Cough: Secondary | ICD-10-CM | POA: Diagnosis present

## 2017-07-29 DIAGNOSIS — R1032 Left lower quadrant pain: Secondary | ICD-10-CM | POA: Insufficient documentation

## 2017-07-29 DIAGNOSIS — R350 Frequency of micturition: Secondary | ICD-10-CM | POA: Diagnosis not present

## 2017-07-29 DIAGNOSIS — F17210 Nicotine dependence, cigarettes, uncomplicated: Secondary | ICD-10-CM | POA: Diagnosis not present

## 2017-07-29 DIAGNOSIS — I132 Hypertensive heart and chronic kidney disease with heart failure and with stage 5 chronic kidney disease, or end stage renal disease: Secondary | ICD-10-CM | POA: Insufficient documentation

## 2017-07-29 DIAGNOSIS — B850 Pediculosis due to Pediculus humanus capitis: Secondary | ICD-10-CM | POA: Insufficient documentation

## 2017-07-29 DIAGNOSIS — S81812A Laceration without foreign body, left lower leg, initial encounter: Secondary | ICD-10-CM | POA: Diagnosis not present

## 2017-07-29 DIAGNOSIS — J181 Lobar pneumonia, unspecified organism: Secondary | ICD-10-CM | POA: Diagnosis not present

## 2017-07-29 DIAGNOSIS — Y999 Unspecified external cause status: Secondary | ICD-10-CM | POA: Insufficient documentation

## 2017-07-29 DIAGNOSIS — I2510 Atherosclerotic heart disease of native coronary artery without angina pectoris: Secondary | ICD-10-CM | POA: Insufficient documentation

## 2017-07-29 DIAGNOSIS — J449 Chronic obstructive pulmonary disease, unspecified: Secondary | ICD-10-CM | POA: Diagnosis not present

## 2017-07-29 DIAGNOSIS — I5042 Chronic combined systolic (congestive) and diastolic (congestive) heart failure: Secondary | ICD-10-CM | POA: Diagnosis not present

## 2017-07-29 DIAGNOSIS — W1830XA Fall on same level, unspecified, initial encounter: Secondary | ICD-10-CM | POA: Insufficient documentation

## 2017-07-29 DIAGNOSIS — Y929 Unspecified place or not applicable: Secondary | ICD-10-CM | POA: Insufficient documentation

## 2017-07-29 DIAGNOSIS — J189 Pneumonia, unspecified organism: Secondary | ICD-10-CM

## 2017-07-29 LAB — COMPREHENSIVE METABOLIC PANEL
ALBUMIN: 2.8 g/dL — AB (ref 3.5–5.0)
ALT: 9 U/L — ABNORMAL LOW (ref 14–54)
AST: 12 U/L — AB (ref 15–41)
Alkaline Phosphatase: 76 U/L (ref 38–126)
Anion gap: 15 (ref 5–15)
BUN: 97 mg/dL — AB (ref 6–20)
CHLORIDE: 105 mmol/L (ref 101–111)
CO2: 19 mmol/L — ABNORMAL LOW (ref 22–32)
Calcium: 6.9 mg/dL — ABNORMAL LOW (ref 8.9–10.3)
Creatinine, Ser: 6.37 mg/dL — ABNORMAL HIGH (ref 0.44–1.00)
GFR calc Af Amer: 6 mL/min — ABNORMAL LOW (ref >60)
GFR calc non Af Amer: 6 mL/min — ABNORMAL LOW (ref >60)
GLUCOSE: 116 mg/dL — AB (ref 65–99)
POTASSIUM: 3.4 mmol/L — AB (ref 3.5–5.1)
Sodium: 139 mmol/L (ref 135–145)
Total Bilirubin: 0.4 mg/dL (ref 0.3–1.2)
Total Protein: 7 g/dL (ref 6.5–8.1)

## 2017-07-29 LAB — URINALYSIS, ROUTINE W REFLEX MICROSCOPIC
Bilirubin Urine: NEGATIVE
GLUCOSE, UA: 50 mg/dL — AB
KETONES UR: NEGATIVE mg/dL
Nitrite: NEGATIVE
PH: 6 (ref 5.0–8.0)
Protein, ur: 100 mg/dL — AB
Specific Gravity, Urine: 1.012 (ref 1.005–1.030)

## 2017-07-29 LAB — CBC
HEMATOCRIT: 32.9 % — AB (ref 36.0–46.0)
Hemoglobin: 10.3 g/dL — ABNORMAL LOW (ref 12.0–15.0)
MCH: 26.3 pg (ref 26.0–34.0)
MCHC: 31.3 g/dL (ref 30.0–36.0)
MCV: 83.9 fL (ref 78.0–100.0)
Platelets: 300 10*3/uL (ref 150–400)
RBC: 3.92 MIL/uL (ref 3.87–5.11)
RDW: 16.8 % — AB (ref 11.5–15.5)
WBC: 13.5 10*3/uL — ABNORMAL HIGH (ref 4.0–10.5)

## 2017-07-29 LAB — LIPASE, BLOOD: LIPASE: 25 U/L (ref 11–51)

## 2017-07-29 MED ORDER — PERMETHRIN 1 % EX LOTN: TOPICAL_LOTION | CUTANEOUS | 0 refills | Status: AC

## 2017-07-29 MED ORDER — SODIUM CHLORIDE 0.9 % IV BOLUS (SEPSIS)
500.0000 mL | Freq: Once | INTRAVENOUS | Status: AC
  Administered 2017-07-29: 500 mL via INTRAVENOUS

## 2017-07-29 MED ORDER — ONDANSETRON HCL 4 MG PO TABS: 4.0000 mg | ORAL_TABLET | Freq: Three times a day (TID) | ORAL | 0 refills | Status: AC | PRN

## 2017-07-29 MED ORDER — AZITHROMYCIN 250 MG PO TABS: ORAL_TABLET | ORAL | 0 refills | Status: AC

## 2017-07-29 MED ORDER — CEFTRIAXONE SODIUM 1 G IJ SOLR
1.0000 g | Freq: Once | INTRAMUSCULAR | Status: AC
  Administered 2017-07-29: 1 g via INTRAVENOUS
  Filled 2017-07-29: qty 10

## 2017-07-29 MED ORDER — AZITHROMYCIN 250 MG PO TABS
500.0000 mg | ORAL_TABLET | Freq: Once | ORAL | Status: AC
  Administered 2017-07-29: 500 mg via ORAL
  Filled 2017-07-29: qty 2

## 2017-07-29 MED ORDER — ONDANSETRON HCL 4 MG/2ML IJ SOLN
4.0000 mg | Freq: Once | INTRAMUSCULAR | Status: AC
  Administered 2017-07-29: 4 mg via INTRAVENOUS
  Filled 2017-07-29: qty 2

## 2017-07-29 MED ORDER — CEFDINIR 300 MG PO CAPS: 300.0000 mg | ORAL_CAPSULE | Freq: Every day | ORAL | 0 refills | Status: AC

## 2017-07-29 NOTE — ED Notes (Signed)
Pt SPO2 reading low due to pt coughing and nasal cannula not properly placed in nostrils. Oxygen increased to 3L temporarily and placed in pt nostrils.

## 2017-07-29 NOTE — ED Provider Notes (Signed)
[facility] DEPT Provider Note   CSN: [id number] Arrival date & time: 07/29/17  1733     History   Chief Complaint Chief Complaint  Patient presents with  . Head Lice  . Emesis  . Abdominal Pain  . Fall    HPI [name] is a 83 y.o. female with PMH of CKD, CHF, HTN, known RUL mass consistent with malignancy, CAD presenting with multiple complaints.  Patient is presenting with multiple complaints and is not accompanied by any family members at this time. Patient is a poor historian. She said she has had productive cough for 1 week duration. Denies fevers, but endorses chills. She also states that she "can't keep anything down." She says when she tries to eat something she vomits it up. Denies abdominal pain or diarrhea. Was asking for food and states she is hungry. Denies chest pain, endorses shortness of breath. Also states she fell several days ago. She says she stood up from a seated position and got very dizzy and fell. She injured her left leg and shin. She cannot tell me if she lost consciousness. Patient states she has a bladder problem, but upon further questioning cannot elaborate.  States she still makes urine.   Last hospital admission approximately 1 year ago for HCAP, UTI, and CKD V. Per chart review, the patient was evaluated by Nephrology and ultimately, patient's family decided to enroll in hospice in order to provide comfort care rather than aggressive work up and management of new lung mass and CKD stage V. She was treated for HCAP and E Coli UTI with antibiotics while in hospital.       Past Medical History:  Diagnosis Date  . Anemia   . Arthritis   . CAD (coronary artery disease)   . CAD (coronary artery disease)    a. Stents in [facility] 2001 and cath 2008 with possible balloon or stent at [facility] (not available in [place]).   . Cancer ([place])    scalp  . Chronic combined systolic and diastolic CHF (congestive heart  failure) ([place])    a. EF 45-50% in 3267. b. Mixed systolic/diastolic - EF 12-45% by echo 2014, 35% by nuc.  . CKD (chronic kidney disease), stage IV ([place]) 08/29/2012  . COPD (chronic obstructive pulmonary disease) ([place])   . Depression   . Gastrointestinal hemorrhage    a. Pt reports she has h/o this many years ago, requiring 2 units of blood. Thinks it was in [place] but records not available. She does not know why this happened.  Marland Kitchen GERD (gastroesophageal reflux disease)   . H/O medication noncompliance    a. Per notes, due to cost.   . Headache(784.0)   . Hemorrhoid    a. Mild hemorrhoidal bleeding per previous DC note.  Marland Kitchen Hx of peptic ulcer   . Hypertension   . Myocardial infarction ([place])   . Nephrolithiasis   . On home oxygen therapy    "use it only when I need it; not very much" (06/12/2016)  . Osteoarthritis, generalized   . Stroke [facility])    a. 2008: acute stroke in left centrum ovale. 06/2010: hemorrhagic stroke.    Patient Active Problem List   Diagnosis Date Noted  . Lung mass 08/01/2016  . Malnutrition of moderate degree 08/01/2016  . UTI (urinary tract infection) 07/31/2016  . HCAP (healthcare-associated pneumonia) 07/31/2016  . Acute pulmonary edema ([place]) 06/12/2016  . Hypertensive emergency 06/12/2016  . Metabolic acidosis 80/99/8338  . Chronic  anemia 06/12/2016  . CKD (chronic kidney disease) stage 5, GFR less than 15 ml/min (HCC) 06/12/2016  . Acute respiratory failure ([place]) 06/12/2016  . Goals of care, counseling/discussion   . Palliative care by specialist   . Acute on chronic combined systolic and diastolic heart failure ([place])   . Community acquired pneumonia   . Palliative care encounter   . Advanced directives, counseling/discussion   . Encounter for hospice care discussion   . CAP (community acquired pneumonia) 08/01/2015  . Acute systolic congestive heart failure, NYHA class 4 ([place]) 02/22/2014  . NSTEMI (non-ST elevated myocardial infarction) ([place])  02/22/2014  . 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, NYHA class 4 and CKD stage 4 ([place]) 02/22/2014  . CHF exacerbation ([place]) 02/20/2014  . Diastolic CHF, acute on chronic (HCC) 02/20/2014  . Chronic obstructive asthma, unspecified 09/13/2012  . Renal dysfunction- Gd 4 08/29/2012  . Hypokalemia 08/29/2012  . Acute exacerbation of CHF (congestive heart failure) ([place]) 08/25/2012  . HYPERTENSION, BENIGN ESSENTIAL, LABILE 07/24/2010  . CAD 07/24/2010  . CEREBROVASCULAR ACCIDENT 07/24/2010  . GASTROINTESTINAL HEMORRHAGE 07/24/2010  . NEPHROLITHIASIS 07/24/2010  . OSTEOARTHRITIS, GENERALIZED 07/24/2010  . PEPTIC ULCER DISEASE, HX OF 07/24/2010    Past Surgical History:  Procedure Laterality Date  . ABDOMINAL HYSTERECTOMY    . APPENDECTOMY    . CHOLECYSTECTOMY    . COLON SURGERY    . COLONOSCOPY  07/18/10  . EYE SURGERY    . MELANOMA EXCISION  1989   distal right LE   . TONSILLECTOMY      OB History    No data available       Home Medications    Prior to Admission medications   Not on File    Family History Family History  Problem Relation Age of Onset  . Diabetes Mother   . Hypertension Mother   . Lung cancer Father   . Diabetes Sister   . Cancer Son        breast  . Lung cancer Son   . Diabetes Sister   . Lung cancer Brother     Social History Social History   Tobacco Use  . Smoking status: Current Every Day Smoker    Packs/day: 0.12    Years: 60.00    Pack years: 7.20    Types: Cigarettes  . Smokeless tobacco: Never Used  Substance Use Topics  . Alcohol use: No  . Drug use: No     Allergies   Patient has no known allergies.   Review of Systems Review of Systems  Constitutional: Positive for chills. Negative for fever.  Eyes: Negative.   Respiratory: Positive for cough and shortness of breath.   Cardiovascular: Negative for chest pain.  Gastrointestinal: Positive for nausea and vomiting.  Negative for abdominal pain and diarrhea.  Genitourinary: Positive for frequency. Negative for difficulty urinating and dysuria.  Skin: Positive for wound.  Neurological: Positive for dizziness and light-headedness.     Physical Exam Updated Vital Signs BP 131/87   Pulse 88   Temp 98 F (36.7 C) (Oral)   Resp 18   Ht 5\' 6"  (1.676 m)   Wt 63.5 kg (140 lb)   SpO2 98%   BMI 22.60 kg/m   Physical Exam  Constitutional: She is oriented to person, place, and time. She appears cachectic. No distress.  Chronically ill appearing   HENT:  Head: Normocephalic and atraumatic.  Lice present  Cardiovascular: Normal rate, regular rhythm  and normal heart sounds.  Extrasystoles are present.  Pulmonary/Chest: Effort normal. She has decreased breath sounds. She has no wheezes.  Abdominal: Soft. Bowel sounds are normal. There is no tenderness.  Musculoskeletal: She exhibits edema.  Neurological: She is alert and oriented to person, place, and time. She has normal strength. No cranial nerve deficit.  No focal neurological deficits.  Skin: Laceration (Lacerations noted on patient left elblow and left shin; no active bleeding ) noted.     ED Treatments / Results  Labs (all labs ordered are listed, but only abnormal results are displayed) Labs Reviewed  COMPREHENSIVE METABOLIC PANEL - Abnormal; Notable for the following components:      Result Value   Potassium 3.4 (*)    CO2 19 (*)    Glucose, Bld 116 (*)    BUN 97 (*)    Creatinine, Ser 6.37 (*)    Calcium 6.9 (*)    Albumin 2.8 (*)    AST 12 (*)    ALT 9 (*)    GFR calc non Af Amer 6 (*)    GFR calc Af Amer 6 (*)    All other components within normal limits  CBC - Abnormal; Notable for the following components:   WBC 13.5 (*)    Hemoglobin 10.3 (*)    HCT 32.9 (*)    RDW 16.8 (*)    All other components within normal limits  URINALYSIS, ROUTINE W REFLEX MICROSCOPIC - Abnormal; Notable for the following components:   Glucose, UA  50 (*)    Hgb urine dipstick SMALL (*)    Protein, ur 100 (*)    Leukocytes, UA MODERATE (*)    Bacteria, UA RARE (*)    Squamous Epithelial / LPF 0-5 (*)    All other components within normal limits  LIPASE, BLOOD    EKG  EKG Interpretation None       Radiology Dg Chest 2 View  Result Date: 07/29/2017 CLINICAL DATA:  Productive cough EXAM: CHEST  2 VIEW COMPARISON:  07/31/2016 plain film and CT FINDINGS: Previously seen right upper lobe mass again noted, better seen on prior study. Descending thoracic aortic aneurysm noted as seen on prior chest CT and is likely stable. Underlying COPD. Heart is mildly enlarged. Right basilar opacity could reflect early pneumonia. IMPRESSION: Right upper lobe mass density remains present, better seen on prior study but likely unchanged. Aneurysmal dilatation of the descending thoracic aorta as seen on prior chest CT, likely unchanged. Cardiomegaly, COPD. Right base atelectasis or pneumonia. Electronically Signed   By: Rolm Baptise M.D.   On: 07/29/2017 20:06    Procedures Procedures (including critical care time)  Medications Ordered in ED Medications  ondansetron (ZOFRAN) injection 4 mg (4 mg Intravenous Given 07/29/17 2053)  sodium chloride 0.9 % bolus 500 mL (0 mLs Intravenous Stopped 07/29/17 2138)  cefTRIAXone (ROCEPHIN) 1 g in sodium chloride 0.9 % 100 mL IVPB (0 g Intravenous Stopped 07/29/17 2210)  azithromycin (ZITHROMAX) tablet 500 mg (500 mg Oral Given 07/29/17 2139)     Initial Impression / Assessment and Plan / ED Course  I have reviewed the triage vital signs and the nursing notes.  Pertinent labs & imaging results that were available during my care of the patient were reviewed by me and considered in my medical decision making (see chart for details).   83 yo female with CKD V, CHF, COPD, known RUL mass consistent with malignancy presenting with cough, nausea, and several episodes of emesis.  Patients vital signs are stable. Patient  appears chronically ill and unkept, but not in acute distress. Vital signs stable, saturating high 90s on 2 liters, which is patients home oxygen requirement. Lice on examination of the scalp; abdominal exam benign, lungs with decreased breath sounds throughout all lung fields, no wheezing. CBC with mildly elevated WBC of 13.5, Hgb stable at 10.3. CMET with stable creatinine 6.37, BUN 97, K 3.4, Ca 6.9. Lipase 25. Chest xray with right base atelectasis or early pneumonia.    2120 Patient's son was in the room when I went checked  on the patient. He states he called EMS because the patient has been very weak and unable to walk around the house with her walker the past two days. No one saw her fall and do not know if she lost consciousness. She was previously on hospice until 4 months ago when she was no longer eligible. Son wants to have her re-evaluated for hospice again. UA still pending. She had a fall two days ago. He also states that they have had to treat her for lice in the past and she keeps getting it despite cleaning sheets and clothing and throwing away bedding.   2240 UA negative for infection. Patient received 500 mg of azithromycin and 1 g ceftriaxone for possibly developing pneumonia. Vital signs stable. Will be discharged home on antibiotics for CAP. Hospice referral placed per request of patient's son.    Final Clinical Impressions(s) / ED Diagnoses   Final diagnoses:  Community acquired pneumonia of right lower lobe of lung [facility])    ED Discharge Orders    None       Melanee Spry, MD 07/29/17 2310    Tanna Furry, MD 07/29/17 2344

## 2017-07-29 NOTE — ED Notes (Signed)
Bed: WLPT4 Expected date:  Expected time:  Means of arrival:  Comments: 

## 2017-07-29 NOTE — ED Triage Notes (Addendum)
Per EMS- Patient is from home. Patient c/o productive cough with yellow/green sputum x 2 weeks. Patient has had N/V x 5 days. Patient also c/o LLQ abdominal pain "for awhile." Patient's family reports that the patient fell 2 days ago from dizziness. Patient has an abrasion to the left arm and left leg. Patient has visible lice present. Patient also has home O2 prn.

## 2017-07-29 NOTE — Discharge Instructions (Signed)
Ms. Kozakiewicz,   Your lab results were stable. Your chest xray did show a small area that could be a developing pneumonia, so you were treated with antibiotics and IV fluids. Your urine was not infected.   I have prescribed you antibiotics to treat the developing pneumonia in your right lower lung. Please take 250 mg of azithromycin daily for 5 more days. I also prescribed you Omnicef. Please take 300 mg daily of Omnicef daily for a total of 5 days.    I have prescribed you nausea medicine called Zofran that you can take every 8 hours as needed for nausea. I have also prescribed you Permethrin lice treatment to place in your hair.   I have placed a referral for hospice to evaluate you.

## 2017-07-29 NOTE — ED Notes (Signed)
Bed: HO64 Expected date:  Expected time:  Means of arrival:  Comments: TR 4

## 2017-07-29 NOTE — ED Provider Notes (Signed)
Patient seen and evaluated.  Discussed with resident.  Patient with weakness and cough.  History of known lung cancer.  History of known end-stage renal disease.  Not undergoing treatment or further evaluation for either of these conditions.  Has had a cough and has had some weakness over the last few days.  Findings showed WBC of 13.5.  Lysis versus possible right basilar pneumonia.  She is in no distress.  She is not tachypneic, tachycardic, or febrile.  We will give IV Rocephin and p.o. Zithromax.  Was given 500 of IV fluids.  Will discuss with family regarding discharge. She does not have clear indications for admission at this time and has taken p.o.  She is saturating 94% on her 1 L of oxygen from home.   Tanna Furry, MD 07/29/17 2154

## 2017-09-20 ENCOUNTER — Emergency Department (HOSPITAL_COMMUNITY): Payer: Medicare Other

## 2017-09-20 ENCOUNTER — Inpatient Hospital Stay (HOSPITAL_COMMUNITY): Payer: Medicare Other

## 2017-09-20 DIAGNOSIS — I959 Hypotension, unspecified: Secondary | ICD-10-CM | POA: Diagnosis not present

## 2017-09-20 DIAGNOSIS — J439 Emphysema, unspecified: Secondary | ICD-10-CM | POA: Diagnosis not present

## 2017-09-20 DIAGNOSIS — Z801 Family history of malignant neoplasm of trachea, bronchus and lung: Secondary | ICD-10-CM

## 2017-09-20 DIAGNOSIS — Z66 Do not resuscitate: Secondary | ICD-10-CM | POA: Diagnosis not present

## 2017-09-20 DIAGNOSIS — Z9981 Dependence on supplemental oxygen: Secondary | ICD-10-CM | POA: Diagnosis not present

## 2017-09-20 DIAGNOSIS — N289 Disorder of kidney and ureter, unspecified: Secondary | ICD-10-CM | POA: Diagnosis not present

## 2017-09-20 DIAGNOSIS — Z515 Encounter for palliative care: Secondary | ICD-10-CM | POA: Diagnosis not present

## 2017-09-20 DIAGNOSIS — M1990 Unspecified osteoarthritis, unspecified site: Secondary | ICD-10-CM | POA: Diagnosis not present

## 2017-09-20 DIAGNOSIS — R109 Unspecified abdominal pain: Secondary | ICD-10-CM | POA: Diagnosis not present

## 2017-09-20 DIAGNOSIS — Z833 Family history of diabetes mellitus: Secondary | ICD-10-CM

## 2017-09-20 DIAGNOSIS — Z85820 Personal history of malignant melanoma of skin: Secondary | ICD-10-CM

## 2017-09-20 DIAGNOSIS — I639 Cerebral infarction, unspecified: Secondary | ICD-10-CM | POA: Diagnosis present

## 2017-09-20 DIAGNOSIS — Z8673 Personal history of transient ischemic attack (TIA), and cerebral infarction without residual deficits: Secondary | ICD-10-CM | POA: Diagnosis not present

## 2017-09-20 DIAGNOSIS — F17210 Nicotine dependence, cigarettes, uncomplicated: Secondary | ICD-10-CM | POA: Diagnosis not present

## 2017-09-20 DIAGNOSIS — I10 Essential (primary) hypertension: Secondary | ICD-10-CM | POA: Diagnosis not present

## 2017-09-20 DIAGNOSIS — R197 Diarrhea, unspecified: Secondary | ICD-10-CM

## 2017-09-20 DIAGNOSIS — I5043 Acute on chronic combined systolic (congestive) and diastolic (congestive) heart failure: Secondary | ICD-10-CM | POA: Diagnosis present

## 2017-09-20 DIAGNOSIS — Z720 Tobacco use: Secondary | ICD-10-CM

## 2017-09-20 DIAGNOSIS — I213 ST elevation (STEMI) myocardial infarction of unspecified site: Secondary | ICD-10-CM | POA: Diagnosis present

## 2017-09-20 DIAGNOSIS — Z7982 Long term (current) use of aspirin: Secondary | ICD-10-CM

## 2017-09-20 DIAGNOSIS — I252 Old myocardial infarction: Secondary | ICD-10-CM | POA: Diagnosis not present

## 2017-09-20 DIAGNOSIS — D631 Anemia in chronic kidney disease: Secondary | ICD-10-CM | POA: Diagnosis present

## 2017-09-20 DIAGNOSIS — N390 Urinary tract infection, site not specified: Secondary | ICD-10-CM | POA: Diagnosis present

## 2017-09-20 DIAGNOSIS — Z90710 Acquired absence of both cervix and uterus: Secondary | ICD-10-CM

## 2017-09-20 DIAGNOSIS — D649 Anemia, unspecified: Secondary | ICD-10-CM | POA: Diagnosis present

## 2017-09-20 DIAGNOSIS — K529 Noninfective gastroenteritis and colitis, unspecified: Secondary | ICD-10-CM | POA: Diagnosis not present

## 2017-09-20 DIAGNOSIS — N185 Chronic kidney disease, stage 5: Secondary | ICD-10-CM | POA: Diagnosis present

## 2017-09-20 DIAGNOSIS — I132 Hypertensive heart and chronic kidney disease with heart failure and with stage 5 chronic kidney disease, or end stage renal disease: Secondary | ICD-10-CM | POA: Diagnosis present

## 2017-09-20 DIAGNOSIS — K219 Gastro-esophageal reflux disease without esophagitis: Secondary | ICD-10-CM | POA: Diagnosis present

## 2017-09-20 DIAGNOSIS — Z955 Presence of coronary angioplasty implant and graft: Secondary | ICD-10-CM | POA: Diagnosis not present

## 2017-09-20 DIAGNOSIS — Z8249 Family history of ischemic heart disease and other diseases of the circulatory system: Secondary | ICD-10-CM | POA: Diagnosis not present

## 2017-09-20 DIAGNOSIS — Z8711 Personal history of peptic ulcer disease: Secondary | ICD-10-CM | POA: Diagnosis not present

## 2017-09-20 DIAGNOSIS — Z9049 Acquired absence of other specified parts of digestive tract: Secondary | ICD-10-CM | POA: Diagnosis not present

## 2017-09-20 DIAGNOSIS — N3000 Acute cystitis without hematuria: Secondary | ICD-10-CM | POA: Diagnosis not present

## 2017-09-20 DIAGNOSIS — R570 Cardiogenic shock: Secondary | ICD-10-CM | POA: Diagnosis present

## 2017-09-20 DIAGNOSIS — I2510 Atherosclerotic heart disease of native coronary artery without angina pectoris: Secondary | ICD-10-CM | POA: Diagnosis not present

## 2017-09-20 LAB — CBC WITH DIFFERENTIAL/PLATELET
Basophils Absolute: 0 10*3/uL (ref 0.0–0.1)
Basophils Relative: 0 %
EOS ABS: 0 10*3/uL (ref 0.0–0.7)
Eosinophils Relative: 0 %
HCT: 27.2 % — ABNORMAL LOW (ref 36.0–46.0)
Hemoglobin: 8.6 g/dL — ABNORMAL LOW (ref 12.0–15.0)
Lymphocytes Relative: 6 %
Lymphs Abs: 0.4 10*3/uL — ABNORMAL LOW (ref 0.7–4.0)
MCH: 26.2 pg (ref 26.0–34.0)
MCHC: 31.6 g/dL (ref 30.0–36.0)
MCV: 82.9 fL (ref 78.0–100.0)
MONO ABS: 0.1 10*3/uL (ref 0.1–1.0)
Monocytes Relative: 1 %
NEUTROS PCT: 93 %
Neutro Abs: 6.7 10*3/uL (ref 1.7–7.7)
PLATELETS: 201 10*3/uL (ref 150–400)
RBC: 3.28 MIL/uL — AB (ref 3.87–5.11)
RDW: 17.4 % — AB (ref 11.5–15.5)
WBC Morphology: INCREASED
WBC: 7.2 10*3/uL (ref 4.0–10.5)

## 2017-09-20 LAB — I-STAT CHEM 8, ED
BUN: 92 mg/dL — ABNORMAL HIGH (ref 6–20)
CHLORIDE: 106 mmol/L (ref 101–111)
Calcium, Ion: 0.91 mmol/L — ABNORMAL LOW (ref 1.15–1.40)
Creatinine, Ser: 6.8 mg/dL — ABNORMAL HIGH (ref 0.44–1.00)
GLUCOSE: 68 mg/dL (ref 65–99)
HCT: 27 % — ABNORMAL LOW (ref 36.0–46.0)
Hemoglobin: 9.2 g/dL — ABNORMAL LOW (ref 12.0–15.0)
POTASSIUM: 4.6 mmol/L (ref 3.5–5.1)
Sodium: 134 mmol/L — ABNORMAL LOW (ref 135–145)
TCO2: 16 mmol/L — ABNORMAL LOW (ref 22–32)

## 2017-09-20 LAB — COMPREHENSIVE METABOLIC PANEL
ALBUMIN: 1.9 g/dL — AB (ref 3.5–5.0)
ALT: 15 U/L (ref 14–54)
ANION GAP: 16 — AB (ref 5–15)
AST: 45 U/L — AB (ref 15–41)
Alkaline Phosphatase: 59 U/L (ref 38–126)
BUN: 96 mg/dL — AB (ref 6–20)
CHLORIDE: 108 mmol/L (ref 101–111)
CO2: 12 mmol/L — ABNORMAL LOW (ref 22–32)
Calcium: 6.6 mg/dL — ABNORMAL LOW (ref 8.9–10.3)
Creatinine, Ser: 7.16 mg/dL — ABNORMAL HIGH (ref 0.44–1.00)
GFR calc Af Amer: 6 mL/min — ABNORMAL LOW (ref >60)
GFR calc non Af Amer: 5 mL/min — ABNORMAL LOW (ref >60)
GLUCOSE: 72 mg/dL (ref 65–99)
POTASSIUM: 4.3 mmol/L (ref 3.5–5.1)
Sodium: 136 mmol/L (ref 135–145)
Total Bilirubin: 0.9 mg/dL (ref 0.3–1.2)
Total Protein: 4.8 g/dL — ABNORMAL LOW (ref 6.5–8.1)

## 2017-09-20 LAB — BRAIN NATRIURETIC PEPTIDE

## 2017-09-20 LAB — LIPID PANEL
CHOL/HDL RATIO: 1.8 ratio
CHOLESTEROL: 77 mg/dL (ref 0–200)
HDL: 43 mg/dL (ref >40)
LDL Cholesterol: 20 mg/dL (ref 0–99)
Triglycerides: 70 mg/dL (ref <150)
VLDL: 14 mg/dL (ref 0–40)

## 2017-09-20 LAB — PROTIME-INR
INR: 1.86
Prothrombin Time: 21.3 seconds — ABNORMAL HIGH (ref 11.4–15.2)

## 2017-09-20 LAB — APTT: APTT: 117 s — AB (ref 24–36)

## 2017-09-20 LAB — I-STAT TROPONIN, ED: Troponin i, poc: 5.02 ng/mL (ref 0.00–0.08)

## 2017-09-20 LAB — TROPONIN I
Troponin I: 4.95 ng/mL (ref <0.03)
Troponin I: 10.59 ng/mL (ref <0.03)

## 2017-09-20 LAB — LIPASE, BLOOD: LIPASE: 16 U/L (ref 11–51)

## 2017-09-20 MED ORDER — SODIUM CHLORIDE 0.9% FLUSH: 3.0000 mL | INTRAVENOUS | Status: DC | PRN

## 2017-09-20 MED ORDER — SODIUM CHLORIDE 0.9 % IV SOLN
INTRAVENOUS | Status: DC
  Administered 2017-09-20: 17:00:00 via INTRAVENOUS

## 2017-09-20 MED ORDER — HYDRALAZINE HCL 20 MG/ML IJ SOLN: 5.0000 mg | INTRAMUSCULAR | Status: DC | PRN

## 2017-09-20 MED ORDER — FUROSEMIDE 10 MG/ML IJ SOLN
40.0000 mg | Freq: Once | INTRAMUSCULAR | Status: AC
  Administered 2017-09-20: 40 mg via INTRAVENOUS
  Filled 2017-09-20: qty 4

## 2017-09-20 MED ORDER — ISOSORBIDE MONONITRATE ER 60 MG PO TB24: 60.0000 mg | ORAL_TABLET | Freq: Every day | ORAL | Status: DC

## 2017-09-20 MED ORDER — ATORVASTATIN CALCIUM 40 MG PO TABS
40.0000 mg | ORAL_TABLET | Freq: Every day | ORAL | Status: DC
  Administered 2017-09-20: 40 mg via ORAL
  Filled 2017-09-20: qty 1

## 2017-09-20 MED ORDER — ASPIRIN 81 MG PO CHEW
324.0000 mg | CHEWABLE_TABLET | Freq: Once | ORAL | Status: AC
  Administered 2017-09-20: 324 mg via ORAL
  Filled 2017-09-20: qty 4

## 2017-09-20 MED ORDER — ASPIRIN 81 MG PO CHEW: 324.0000 mg | CHEWABLE_TABLET | Freq: Every day | ORAL | Status: DC

## 2017-09-20 MED ORDER — SODIUM CHLORIDE 0.9 % IV BOLUS
500.0000 mL | Freq: Once | INTRAVENOUS | Status: AC
  Administered 2017-09-20: 500 mL via INTRAVENOUS

## 2017-09-20 MED ORDER — ONDANSETRON HCL 4 MG/2ML IJ SOLN: 4.0000 mg | Freq: Four times a day (QID) | INTRAMUSCULAR | Status: DC | PRN

## 2017-09-20 MED ORDER — IPRATROPIUM-ALBUTEROL 0.5-2.5 (3) MG/3ML IN SOLN
3.0000 mL | Freq: Four times a day (QID) | RESPIRATORY_TRACT | Status: DC
  Administered 2017-09-20 – 2017-09-21 (×2): 3 mL via RESPIRATORY_TRACT
  Filled 2017-09-20 (×2): qty 3

## 2017-09-20 MED ORDER — DM-GUAIFENESIN ER 30-600 MG PO TB12
1.0000 | ORAL_TABLET | Freq: Two times a day (BID) | ORAL | Status: DC | PRN
Start: 2017-09-20 — End: 2017-09-21

## 2017-09-20 MED ORDER — NITROGLYCERIN 0.4 MG SL SUBL: 0.4000 mg | SUBLINGUAL_TABLET | SUBLINGUAL | Status: DC | PRN

## 2017-09-20 MED ORDER — ACETAMINOPHEN 325 MG PO TABS: 650.0000 mg | ORAL_TABLET | ORAL | Status: DC | PRN

## 2017-09-20 MED ORDER — ASPIRIN EC 81 MG PO TBEC: 81.0000 mg | DELAYED_RELEASE_TABLET | Freq: Every day | ORAL | Status: DC

## 2017-09-20 MED ORDER — HEPARIN SODIUM (PORCINE) 5000 UNIT/ML IJ SOLN
60.0000 [IU]/kg | Freq: Once | INTRAMUSCULAR | Status: AC
  Administered 2017-09-20: 3800 [IU] via INTRAVENOUS

## 2017-09-20 MED ORDER — HEPARIN (PORCINE) IN NACL 100-0.45 UNIT/ML-% IJ SOLN
800.0000 [IU]/h | INTRAMUSCULAR | Status: DC
  Administered 2017-09-20: 800 [IU]/h via INTRAVENOUS
  Filled 2017-09-20: qty 250

## 2017-09-20 MED ORDER — SODIUM CHLORIDE 0.9% FLUSH
3.0000 mL | Freq: Two times a day (BID) | INTRAVENOUS | Status: DC
  Administered 2017-09-20: 3 mL via INTRAVENOUS

## 2017-09-20 MED ORDER — SODIUM CHLORIDE 0.9 % IV SOLN: 250.0000 mL | INTRAVENOUS | Status: DC | PRN

## 2017-09-20 MED ORDER — IOPAMIDOL (ISOVUE-300) INJECTION 61%
INTRAVENOUS | Status: AC
  Filled 2017-09-20: qty 30

## 2017-09-20 MED ORDER — PANTOPRAZOLE SODIUM 40 MG PO TBEC: 40.0000 mg | DELAYED_RELEASE_TABLET | Freq: Every day | ORAL | Status: DC

## 2017-09-20 MED ORDER — ZOLPIDEM TARTRATE 5 MG PO TABS: 5.0000 mg | ORAL_TABLET | Freq: Every evening | ORAL | Status: DC | PRN

## 2017-09-20 MED ORDER — ALBUTEROL SULFATE (2.5 MG/3ML) 0.083% IN NEBU: 2.5000 mg | INHALATION_SOLUTION | RESPIRATORY_TRACT | Status: DC | PRN

## 2017-09-20 MED ORDER — NICOTINE 21 MG/24HR TD PT24: 21.0000 mg | MEDICATED_PATCH | Freq: Every day | TRANSDERMAL | Status: DC

## 2017-09-20 MED ORDER — METOPROLOL TARTRATE 25 MG PO TABS
12.5000 mg | ORAL_TABLET | Freq: Two times a day (BID) | ORAL | Status: DC
  Administered 2017-09-20: 12.5 mg via ORAL
  Filled 2017-09-20: qty 1

## 2017-09-20 NOTE — ED Notes (Signed)
Called the pharmacy about missing heparin.

## 2017-09-20 NOTE — ED Triage Notes (Signed)
Pt to Ed from home via [facility] with complaints of bilateral leg swelling from the knee down that is leaking fluid and not able to walk for the last 3 days. Pt has hx of COPD, CHF, Afib, Kidney failure and liver failure.  A&O x3 per her norm. Pt is current smoker.   EMS vitals 92/50 HR 88 24 RR 85% on RA

## 2017-09-20 NOTE — ED Notes (Signed)
This nurse collected full rainbow

## 2017-09-20 NOTE — ED Notes (Signed)
Patient transported to X-ray 

## 2017-09-20 NOTE — H&P (Addendum)
History and Physical    [name] QQI:[id number] DOB: 1935/05/26 DOA: 09/23/2017  Referring MD/NP/PA:   PCP: Donald Prose, MD   Patient coming from:  The patient is coming from home.  At baseline, pt is dependent for most of ADL.   Chief Complaint: Bilateral leg edema, chest pain, shortness of breath, abdominal pain, diarrhea  HPI: [name] is a 83 y.o. female with medical history significant of CKD-V (refused hemodialysis before), hypertension, COPD, stroke, GERD, depression, CAD, peptic ulcers, CHF with EF of 30%, anemia, tobacco abuse, hospice care, who presents with bilateral leg edema, chest pain, shortness of breath, abdominal pain, diarrhea.  Pt states that her bilateral leg edema has worsened recently. She has fluid oozing in both legs and small skin tear in the right lower leg. She also has SOB and chest pain. She cannot give detailed information about her chest pain. She coughs up greenish colored mucus. Denies fever or chills. Per his son, patient has chronic diarrhea and abdominal pain. Patient states that she had 5 watery diarrh  ea today. She denies nausea and vomiting. She also reports dysuria sometimes. She moves all extremities. No unilateral weakness, numbness or tenderness extremities. Of note, patient has stage V chronic kidney disease, and he refused dialysis in the past. Per his son, pt was told to have 6 months to live in the past. She  was under hospice care before, but hospice care team stopped seeing pt recently.  ED Course: pt was found to have STEMI, troponin4.5, WBC 7.2, INR 1.86, PTT 117, renal function close to baseline, temperature 97.4, hypotension with blood pressure 83/65, which improved to 96/45 out 500 mL normal saline bolus,tachycardia, tachypnea, oxygen saturation 85% on room air. Pending UA. Pt is but admitted to stepdown as inpatient. Cardiology was consulted. Patient is a DO NOT RESUSCITATE.  # CXR showed: marked enlargement of the  cardiopericardial silhouette, pericardial effusion not excluded. Progression of patchy airspace disease in the right lung with increasing small right pleural effusion.  Review of Systems:   General: no fevers, chills, has poor appetite, has fatigue HEENT: no blurry vision, hearing changes or sore throat Respiratory: has dyspnea, coughing, no wheezing CV: has chest pain, no palpitations GI: no nausea, vomiting, has abdominal pain, diarrhea, no constipation GU: no dysuria, burning on urination, increased urinary frequency, hematuria  Ext: has leg edema Neuro: no unilateral weakness, numbness, or tingling, no vision change or hearing loss Skin: has skin tear in right lower leg MSK: No muscle spasm, no deformity, no limitation of range of movement in spin Heme: No easy bruising.  Travel history: No recent long distant travel.  Allergy: No Known Allergies  Past Medical History:  Diagnosis Date  . Anemia   . Arthritis   . CAD (coronary artery disease)   . CAD (coronary artery disease)    a. Stents in [facility] 2001 and cath 2008 with possible balloon or stent at [facility] (not available in [place]).   . Cancer ([place])    scalp  . Chronic combined systolic and diastolic CHF (congestive heart failure) ([place])    a. EF 45-50% in 9417. b. Mixed systolic/diastolic - EF 40-81% by echo 2014, 35% by nuc.  . CKD (chronic kidney disease), stage IV ([place]) 08/29/2012  . COPD (chronic obstructive pulmonary disease) ([place])   . Depression   . Gastrointestinal hemorrhage    a. Pt reports she has h/o this many years ago, requiring 2 units of blood. Thinks it was in [place]  but records not available. She does not know why this happened.  Marland Kitchen GERD (gastroesophageal reflux disease)   . H/O medication noncompliance    a. Per notes, due to cost.   . Headache(784.0)   . Hemorrhoid    a. Mild hemorrhoidal bleeding per previous DC note.  Marland Kitchen Hx of peptic ulcer   . Hypertension   . Myocardial infarction ([place])   .  Nephrolithiasis   . On home oxygen therapy    "use it only when I need it; not very much" (06/12/2016)  . Osteoarthritis, generalized   . Stroke [facility])    a. 2008: acute stroke in left centrum ovale. 06/2010: hemorrhagic stroke.    Past Surgical History:  Procedure Laterality Date  . ABDOMINAL HYSTERECTOMY    . APPENDECTOMY    . CHOLECYSTECTOMY    . COLON SURGERY    . COLONOSCOPY  07/18/10  . EYE SURGERY    . MELANOMA EXCISION  1989   distal right LE   . TONSILLECTOMY      Social History:  reports that she has been smoking cigarettes.  She has a 7.20 pack-year smoking history. She has never used smokeless tobacco. She reports that she does not drink alcohol or use drugs.  Family History:  Family History  Problem Relation Age of Onset  . Diabetes Mother   . Hypertension Mother   . Lung cancer Father   . Diabetes Sister   . Cancer Son        breast  . Lung cancer Son   . Diabetes Sister   . Lung cancer Brother      Prior to Admission medications   Medication Sig Start Date End Date Taking? Authorizing Provider  amLODipine (NORVASC) 10 MG tablet Take 10 mg by mouth daily.   Yes [provider]  aspirin 81 MG chewable tablet Chew 81 mg by mouth daily.   Yes [provider]  azithromycin (ZITHROMAX) 250 MG tablet Take 1 tablet daily. 07/29/17  Yes Lacroce, Hulen Shouts, MD  carvedilol (COREG) 25 MG tablet Take 25 mg by mouth 2 (two) times daily with a meal.   Yes [provider]  cefdinir (OMNICEF) 300 MG capsule Take 1 capsule (300 mg total) by mouth daily. 07/29/17  Yes Lacroce, Hulen Shouts, MD  isosorbide mononitrate (IMDUR) 60 MG 24 hr tablet Take 60 mg by mouth daily.   Yes [provider]  ondansetron (ZOFRAN) 4 MG tablet Take 1 tablet (4 mg total) by mouth every 8 (eight) hours as needed for nausea or vomiting. 07/29/17  Yes Lacroce, Hulen Shouts, MD  permethrin (PERMETHRIN LICE TREATMENT) 1 % lotion Apply to affected area once 07/29/17 07/28/18  Yes Lacroce, Hulen Shouts, MD  torsemide (DEMADEX) 20 MG tablet Take 20 mg by mouth 4 (four) times daily.   Yes [provider]    Physical Exam: Vitals:   10/14/2017 2248 09/27/2017 2250 2017-10-17 0100 10/17/2017 0143  BP: 97/77 97/77  (!) 69/39  Pulse: (!) 133  (!) 34 (!) 33  Resp:  (!) 26 (!) 32 (!) 32  Temp:  98 F (36.7 C)    TempSrc:  Axillary    SpO2:   (!) 26% (!) 26%  Weight:      Height:       General: in moderate acute distress HEENT:       Eyes: PERRL, EOMI, no scleral icterus.       ENT: No discharge from the ears and nose, no pharynx  injection, no tonsillar enlargement.        Neck: postive JVD, no bruit, no mass felt. Heme: No neck lymph node enlargement. Cardiac: S1/S2, RRR, No murmurs, No gallops or rubs. Respiratory: has crackles bilaterally. No wheezing GI: Soft, nondistended, has diffused tenderness, no rebound pain, no organomegaly, BS present. GU: No hematuria Ext: 3+ pitting leg edema bilaterally (R>L). 1+DP/PT pulse bilaterally. Musculoskeletal: No joint deformities, No joint redness or warmth, no limitation of ROM in spin. Skin: has small skin tears in right lower leg. Neuro: Alert, oriented to person and place, but not to X3, cranial nerves II-XII grossly intact, moves all extremities. Psych: Patient is not psychotic, no suicidal or hemocidal ideation.  Labs on Admission: I have personally reviewed following labs and imaging studies  CBC: Recent Labs  Lab 10/06/2017 1633 10/01/2017 1710  WBC  --  7.2  NEUTROABS  --  6.7  HGB 9.2* 8.6*  HCT 27.0* 27.2*  MCV  --  82.9  PLT  --  154   Basic Metabolic Panel: Recent Labs  Lab 10/05/2017 1633 09/26/2017 1710  NA 134* 136  K 4.6 4.3  CL 106 108  CO2  --  12*  GLUCOSE 68 72  BUN 92* 96*  CREATININE 6.80* 7.16*  CALCIUM  --  6.6*   GFR: Estimated Creatinine Clearance: 5.5 mL/min (A) (by C-G formula based on SCr of 7.16 mg/dL (H)). Liver Function Tests: Recent Labs  Lab 10/01/2017 1710  AST 45*   ALT 15  ALKPHOS 59  BILITOT 0.9  PROT 4.8*  ALBUMIN 1.9*   Recent Labs  Lab 09/30/2017 2035  LIPASE 16   No results for input(s): AMMONIA in the last 168 hours. Coagulation Profile: Recent Labs  Lab 09/27/2017 1710  INR 1.86   Cardiac Enzymes: Recent Labs  Lab 09/23/2017 1710 09/23/2017 2009  TROPONINI 4.95* 10.59*   BNP (last 3 results) No results for input(s): PROBNP in the last 8760 hours. HbA1C: No results for input(s): HGBA1C in the last 72 hours. CBG: No results for input(s): GLUCAP in the last 168 hours. Lipid Profile: Recent Labs    10/08/2017 1713  CHOL 77  HDL 43  LDLCALC 20  TRIG 70  CHOLHDL 1.8   Thyroid Function Tests: No results for input(s): TSH, T4TOTAL, FREET4, T3FREE, THYROIDAB in the last 72 hours. Anemia Panel: No results for input(s): VITAMINB12, FOLATE, FERRITIN, TIBC, IRON, RETICCTPCT in the last 72 hours. Urine analysis:    Component Value Date/Time   COLORURINE YELLOW 07/29/2017 1813   APPEARANCEUR CLEAR 07/29/2017 1813   LABSPEC 1.012 07/29/2017 1813   PHURINE 6.0 07/29/2017 1813   GLUCOSEU 50 (A) 07/29/2017 1813   HGBUR SMALL (A) 07/29/2017 1813   BILIRUBINUR NEGATIVE 07/29/2017 1813   KETONESUR NEGATIVE 07/29/2017 1813   PROTEINUR 100 (A) 07/29/2017 1813   UROBILINOGEN 1.0 02/20/2014 0455   NITRITE NEGATIVE 07/29/2017 1813   LEUKOCYTESUR MODERATE (A) 07/29/2017 1813   Sepsis Labs: @LABRCNTIP (procalcitonin:4,lacticidven:4) )No results found for this or any previous visit (from the past 240 hour(s)).   Radiological Exams on Admission: Ct Abdomen Pelvis Wo Contrast  Result Date: 2017/09/25 CLINICAL DATA:  Diffuse abdominal pain, tenderness, and diarrhea. Patient is not communicate of. EXAM: CT ABDOMEN AND PELVIS WITHOUT CONTRAST TECHNIQUE: Multidetector CT imaging of the abdomen and pelvis was performed following the standard protocol without IV contrast. COMPARISON:  None. FINDINGS: Examination is technically limited due to motion  artifact. Lower chest: Small right pleural effusion with atelectasis or consolidation in the  right lung base. This could indicate pneumonia. Cardiac enlargement. Coronary artery calcifications. Residual contrast material in the esophagus could indicate reflux or dysmotility. Hepatobiliary: No focal liver abnormality is seen. Status post cholecystectomy. No biliary dilatation. Pancreas: Unremarkable. No pancreatic ductal dilatation or surrounding inflammatory changes. Spleen: Normal in size without focal abnormality. Adrenals/Urinary Tract: No adrenal gland nodules. Kidneys are atrophic bilaterally. No hydronephrosis or hydroureter. Bladder is decompressed with a Foley catheter. Stomach/Bowel: Stomach, small bowel, and colon are not abnormally distended. Under distention limits the evaluation, but there is suggestion of mild colonic wall thickening which may indicate evidence of colitis. No pneumatosis. No definite small bowel wall thickening. Scattered diverticula in the sigmoid colon without evidence of diverticulitis. Appendix is not identified. Vascular/Lymphatic: Diffuse aortic calcification with calcification of all major branch vessels including celiac axis, superior mesenteric artery, bilateral renal arteries, and iliac and common femoral arteries. Calcific stenosis of the mesenteric arteries is not excluded. There is an abdominal aortic aneurysm measuring 4.2 cm in maximal AP diameter. No retroperitoneal hematoma. Reproductive: Status post hysterectomy. No adnexal masses. Other: No free air or free fluid in the abdomen. Musculoskeletal: Degenerative changes in the spine. No destructive bone lesions. IMPRESSION: 1. Small right pleural effusion with consolidation in the right lung base may be indicating pneumonia. 2. Cardiac enlargement. 3. Residual contrast material in the esophagus could indicate reflux or dysmotility. 4. Possible colonic wall thickening may indicate colitis. Nonspecific etiology. 5. Diffuse  vascular calcifications. Can't exclude mesenteric stenosis. 6. Abdominal aortic aneurysm measuring 4.2 cm in maximal diameter. 7. Scattered colonic diverticula without evidence of diverticulitis. Electronically Signed   By: Lucienne Capers M.D.   On: 2017-09-30 00:07   Dg Chest 2 View  Result Date: 09/29/2017 CLINICAL DATA:  Bilateral lower extremity swelling. EXAM: CHEST - 2 VIEW COMPARISON:  07/29/2017 FINDINGS: 1621 hours. The cardio pericardial silhouette is enlarged. Patchy airspace disease right upper lobe has progressed in the interval. Small right pleural effusion is progressive. Interstitial markings are diffusely coarsened with chronic features. The visualized bony structures of the thorax are intact. Telemetry leads overlie the chest. IMPRESSION: Marked enlargement of the cardiopericardial silhouette, pericardial effusion not excluded. Progression of patchy airspace disease in the right lung with increasing small right pleural effusion. Electronically Signed   By: Misty Stanley M.D.   On: 09/30/2017 16:40     EKG: Independently reviewed.sinus rhythm, QTC 491, ST elevation in lateral leads, ST depression in inferior leads and precordial leads.  Assessment/Plan Principal Problem:   STEMI (ST elevation myocardial infarction) ([place]) Active Problems:   Acute on chronic combined systolic and diastolic heart failure (HCC)   CKD (chronic kidney disease) stage 5, GFR less than 15 ml/min (HCC)   UTI (urinary tract infection)   COPE (chronic obstructive pulmonary emphysema) (HCC)   Stroke (HCC)   Normocytic anemia   Tobacco abuse   Hypotension   Essential hypertension   Abdominal pain   Diarrhea   Colitis   Cardiogenic shock ([place])  Addendum-1:50 AM:  Pt is deteriorating and actively dying now. Her blood pressure dropped to 69/39, HR 33, O2 sat 26%. 2 hours after giving 40 mg of lasix, she had ~200 cc of urine out put. CT of abdomen showed possible colitis. UA positive for UTI. Pt now most  likely has cardiogenic shock. Ongoing infection may have also contributed partially. Her Ca level is 6.6. I called his son and gave him the update and discussed the comfort care. His son wants [place] to continue current  treatment. If pt can make to the morning, will consult to hospice and palliative care.   -will hold all medications which lower the Bp: nitroglycerin, metoprolol, Lasix, imdur -give 200 cc of NS bolus -start IV Cipro and Flagyl -give 2 g of calcium gluconate -f/u Bx and Ux -continue high flow nonrebreather O2  STEMI (ST elevation myocardial infarction) ([place]): trop 4.95. Dr. Harrell Gave of cardiology was consulted. He had lengthy discussion with patient's son who is the power of attorney. Her son chose medical measurement, no cath.   - will admit to tele bed as inpt - Highly appreciate Dr. Judeth Cornfield recommendations. - IV Heparin - cycle CE q6 x3 and repeat EKG in the am  - prn Nitroglycerin, Morphine, and aspirin, lipitor  - will start metoprolol 12.5 mg twice a day per card, with holding parameters  - Risk factor stratification: will check FLP and A1C  - 2d echo - palliative consult - need to contact hospice care in AM  Acute on chronic combined systolic and diastolic heart failure: 2-D echo on 08/02/15 showed EF 30-50 percent with grade 1 diastolic dysfunction. -give one dose of lasix 40 mg per card.  If she does not have brisk urine output with this within 2 hours I would double the dose.  -f/u 2d echo -Daily weights -strict I/O's  CKD-V: baseline creatinine 6.0 recently. Her creatinine is 6.80, BUN 22. Patient refused dialysis in the past. -f/u renal Fx by BMP  COPE (chronic obstructive pulmonary emphysema) ([place]): -Duonebs nebs and prn albuterol nebs  Hx of Stroke [facility]): - ASA and lipitor   Normocytic anemia: likely due to CKD-V. Hgb 10.3 on 07/29/17-->8.6 today -f/u by CBC -will get anemia panel   Hypotension: likely due to STEMI, at risk of developing  cardiogenic shock.blood pressure is improved to 96/45 out 500 mL of normal saline bolus. -Monitor blood pressure closely when giving lasix and metoprolol per card  Tobacco abuse -Nicotine patch  Essential hypertension: -hold amlodipine Due to hypotension  Abdominal pain and Diarrhea: etiology is not clear. -Check lipase -CT abdomen/pelvis -c diff pcr   DVT ppx: IV Heparin  Code Status: DNR (EDP, Dr. Melina Copa discussed with his son who is the POA, pt wants to be DNR; I also confirmed this DNR with her son on the phone). Family Communication: I spoke with her son Disposition Plan:  To be determined Consults called:  Card, Dr. Harrell Gave Admission status: SDU/inpation       Date of Service 22-Sep-2017    Ivor Costa [facility] Pager [phone]  If 7PM-7AM, please contact night-coverage [email or web address] Password [email or web address] 09-22-2017, 2:06 AM

## 2017-09-20 NOTE — ED Notes (Signed)
MD aware of pt BPs. Pt has bilateral pitting edema, right worse than left with some wheeping.

## 2017-09-20 NOTE — Progress Notes (Signed)
This note also relates to the following rows which could not be included: SpO2 - Cannot attach notes to unvalidated device data  Pt on comfort care monitor with no readings.  Sat monitor not reading.

## 2017-09-20 NOTE — ED Notes (Signed)
Lab called, never got blood work. Tube station has been down, blood work apparently "lost in tube station"

## 2017-09-20 NOTE — Progress Notes (Signed)
ANTICOAGULATION CONSULT NOTE - Initial Consult  Pharmacy Consult for Heparin Indication: chest pain/ACS  No Known Allergies  Patient Measurements: Height: 5\' 2"  (157.5 cm) Weight: 140 lb (63.5 kg) IBW/kg (Calculated) : 50.1 Heparin Dosing Weight: 63 kg  Vital Signs: Temp: 97.4 F (36.3 C) (04/06 1546) Temp Source: Oral (04/06 1546) BP: 106/65 (04/06 1637) Pulse Rate: 101 (04/06 1637)  Labs: Recent Labs    09/17/2017 1633  HGB 9.2*  HCT 27.0*  CREATININE 6.80*    Estimated Creatinine Clearance: 5.8 mL/min (A) (by C-G formula based on SCr of 6.8 mg/dL (H)).   Medical History: Past Medical History:  Diagnosis Date  . Anemia   . Arthritis   . CAD (coronary artery disease)   . CAD (coronary artery disease)    a. Stents in [facility] 2001 and cath 2008 with possible balloon or stent at [facility] (not available in [place]).   . Cancer ([place])    scalp  . Chronic combined systolic and diastolic CHF (congestive heart failure) ([place])    a. EF 45-50% in 7096. b. Mixed systolic/diastolic - EF 28-36% by echo 2014, 35% by nuc.  . CKD (chronic kidney disease), stage IV ([place]) 08/29/2012  . COPD (chronic obstructive pulmonary disease) ([place])   . Depression   . Gastrointestinal hemorrhage    a. Pt reports she has h/o this many years ago, requiring 2 units of blood. Thinks it was in [place] but records not available. She does not know why this happened.  Marland Kitchen GERD (gastroesophageal reflux disease)   . H/O medication noncompliance    a. Per notes, due to cost.   . Headache(784.0)   . Hemorrhoid    a. Mild hemorrhoidal bleeding per previous DC note.  Marland Kitchen Hx of peptic ulcer   . Hypertension   . Myocardial infarction ([place])   . Nephrolithiasis   . On home oxygen therapy    "use it only when I need it; not very much" (06/12/2016)  . Osteoarthritis, generalized   . Stroke [facility])    a. 2008: acute stroke in left centrum ovale. 06/2010: hemorrhagic stroke.    Assessment: 87 YOF who presented  with leg/abdominal swelling and SOB - denied CP however workup and ECG revealed a lateral STEMI.  Pharmacy consulted to start Heparin for anticoagulation.   No blood thinners noted PTA except for a bASA. Hgb 9.2 this admit, plts~300 from labs in Feb '19. Noted history of hemorrhagic stroke and GI hemorrhage in 2012 - has been treated with heparin boluses and drips since that time without issues.   The patient has already received Heparin 3800 unit bolus as ordered by ED-MD  Goal of Therapy:  Heparin level 0.3-0.7 units/ml Monitor platelets by anticoagulation protocol: Yes   Plan:  1. Heparin bolus already given by ED-MD 2. Start Heparin at 800 units/hr (8 ml/hr) 3. Will continue to monitor for any signs/symptoms of bleeding and will follow up with heparin level in 8 hours  Thank you for allowing pharmacy to be a part of this patient's care.  Alycia Rossetti, PharmD, BCPS Clinical Pharmacist Pager: [phone] 10/05/2017 5:43 PM

## 2017-09-20 NOTE — Consult Note (Signed)
CARDIOLOGY CONSULT NOTE   Referring Physician: Dr. Melina Copa Primary Physician: Donald Prose Primary Cardiologist: Seen by Nishan/Nahser inpatient 2017, no outpatient cardiologist Reason for Consultation: STEMI in the setting of no chest pain/DNR/hospice care/CKD   HPI: [name] is an 83 yo woman who is seen in consult at the request of Dr. Melina Copa for STEMI in the setting of hospice care/no chest pain/DNR/CKD. Her PMH is notable for CKD stage V, remote CVA in 2008 and again with hemorrhagic stroke in 2012, HTN, chronic systolic heart failure with EF 30-35% in 2017, NSTEMI in 2017. She was initially called in by [facility] as a STEMI, but on conversation with Dr. Dionicia Abler was felt to be a poor candidate. She was seen in the ER upon transfer to [facility] ER, but she is a poor historian and was able to given little to no history. She denied chest pain but was feeling short of breath. She was able to say they had discussed no cath in the past. Her son, [name], was contacted at [phone]. He confirmed that she wished to be DNAR (documented in previous admissions), did not wish to have any procedures such as heart catheterization, and she was enrolled in home hospice that was pending start of services. His main concern had been increased leg swelling over the last several days, which he felt he could not manage at home. Given the patient's prior discussion of her wishes, the cath lab was not activated.  Per the patient's son, she was being set up for home hospice and had received most of the needed equipment. She had home oxygen but was not requiring. He hasn't noted any recent changes except for lower leg swelling. When she was tender over the right hip/groin on exam, he stated that she had a fall several months ago and still had pain. She had however been up and walking until the last several days.   ROS notable for shortness of breath, especially when lying down, and lower extremity  edema. Denies chest pain. No fevers/chills noted. Unsure of weight gain. Has some bruising in R groin (denies any recent procedure or trauma) but denies melena/hematochezia or other bleeding.   Review of Systems:     Cardiac Review of Systems: {Y] = yes [ ]  = no  Chest Pain [ N   ]  Resting SOB [ Y  ] Exertional SOB  [ NA ]  Orthopnea [ Y ]   Pedal Edema [Y   ]    Palpitations Aqua.Slicker  ] Syncope  [ N ]   Presyncope [ N  ]  General Review of Systems: [Y] = yes [  ]=no Constitional: recent weight change [  ]; anorexia [  ]; fatigue [  ]; nausea [  ]; night sweats [  ]; fever [  ]; or chills [  ];                                                                     Eyes : blurred vision [  ]; diplopia [   ]; vision changes [  ];  Amaurosis fugax[  ]; Resp: cough [  ];  wheezing[  ];  hemoptysis[  ];  PND [  ];  GI:  gallstones[  ], vomiting[  ];  dysphagia[  ]; melena[  ];  hematochezia [  ]; heartburn[  ];   GU: kidney stones [  ]; hematuria[  ];   dysuria [  ];  nocturia[  ]; incontinence [  ];             Skin: rash, swelling[Y  ];, hair loss[  ];  peripheral edema[  Y];  or itching[  ]; Musculosketetal: myalgias[  ];  joint swelling[  ];  joint erythema[  ];  joint pain[ Y ];  back pain[  ];  Heme/Lymph: bruising[ Y ];  bleeding[N  ];  anemia[  ];  Neuro: TIA[  ];  headaches[  ];  stroke[  ];  vertigo[  ];  seizures[  ];   paresthesias[  ];  difficulty walking[Y  ];  Psych:depression[  ]; anxiety[  ];  Endocrine: diabetes[  ];  thyroid dysfunction[  ];  Other:  Past Medical History:  Diagnosis Date  . Anemia   . Arthritis   . CAD (coronary artery disease)   . CAD (coronary artery disease)    a. Stents in [facility] 2001 and cath 2008 with possible balloon or stent at [facility] (not available in [place]).   . Cancer ([place])    scalp  . Chronic combined systolic and diastolic CHF (congestive heart failure) ([place])    a. EF 45-50% in 3474. b. Mixed systolic/diastolic - EF 25-95% by echo 2014, 35% by  nuc.  . CKD (chronic kidney disease), stage IV ([place]) 08/29/2012  . COPD (chronic obstructive pulmonary disease) ([place])   . Depression   . Gastrointestinal hemorrhage    a. Pt reports she has h/o this many years ago, requiring 2 units of blood. Thinks it was in [place] but records not available. She does not know why this happened.  Marland Kitchen GERD (gastroesophageal reflux disease)   . H/O medication noncompliance    a. Per notes, due to cost.   . Headache(784.0)   . Hemorrhoid    a. Mild hemorrhoidal bleeding per previous DC note.  Marland Kitchen Hx of peptic ulcer   . Hypertension   . Myocardial infarction ([place])   . Nephrolithiasis   . On home oxygen therapy    "use it only when I need it; not very much" (06/12/2016)  . Osteoarthritis, generalized   . Stroke [facility])    a. 2008: acute stroke in left centrum ovale. 06/2010: hemorrhagic stroke.     (Not in a hospital admission)     Infusions: . sodium chloride 20 mL/hr at 10/07/2017 1636  . heparin 800 Units/hr (09/26/2017 1900)  . sodium chloride 500 mL (10/10/2017 1842)    No Known Allergies  Social History   Socioeconomic History  . Marital status: Divorced    Spouse name: Not on file  . Number of children: Not on file  . Years of education: Not on file  . Highest education level: Not on file  Occupational History  . Not on file  Social Needs  . Financial resource strain: Not on file  . Food insecurity:    Worry: Not on file    Inability: Not on file  . Transportation needs:    Medical: Not on file    Non-medical: Not on file  Tobacco Use  . Smoking status: Current Every Day Smoker    Packs/day: 0.12    Years: 60.00    Pack years: 7.20    Types: Cigarettes  . Smokeless  tobacco: Never Used  Substance and Sexual Activity  . Alcohol use: No  . Drug use: No  . Sexual activity: Never  Lifestyle  . Physical activity:    Days per week: Not on file    Minutes per session: Not on file  . Stress: Not on file  Relationships  . Social  connections:    Talks on phone: Not on file    Gets together: Not on file    Attends religious service: Not on file    Active member of club or organization: Not on file    Attends meetings of clubs or organizations: Not on file    Relationship status: Not on file  . Intimate partner violence:    Fear of current or ex partner: Not on file    Emotionally abused: Not on file    Physically abused: Not on file    Forced sexual activity: Not on file  Other Topics Concern  . Not on file  Social History Narrative   Graduate [employer]   Married '59-30 yrs/divorced   2 sons, '61 '62; 8 grandchildren   Works as Optometrist, Field seismologist   Retired-lives in her own home and her son lives with her.     Family History  Problem Relation Age of Onset  . Diabetes Mother   . Hypertension Mother   . Lung cancer Father   . Diabetes Sister   . Cancer Son        breast  . Lung cancer Son   . Diabetes Sister   . Lung cancer Brother     PHYSICAL EXAM: Vitals:   09/16/2017 1742 09/25/2017 1804  BP:    Pulse:  (!) 103  Resp: (!) 40 (!) 28  Temp:    SpO2:  100%    No intake or output data in the 24 hours ending 09/25/2017 1913  General:  Frail appearing elderly woman, tachypneic HEENT: NCAT Neck: supple. JVD elevated to jaw at 60 degrees.  Cor: PMI displaced laterally though patient somewhat rotated, tachycardic but regular. No appreciable murmurs Lungs: diffuse crackles, worse at bilateral bases Abdomen: soft, tender in R groin area. Extremities: Significant bilateral lower extremity edema, right somewhat worse than left, with mildly erythematous skin changes, several scattered wounds, and areas of punctate weeping. Skin is warm. Neuro: slurred speech, responds to questions though difficult historian, moved all extremities independently  ECG: sinus rhythm at 98 bpm (tachycardic on monitor), LVH with repol but ST elevations in I, aVL with reciprocal inferior depressions consistent  with STEMI  Results for orders placed or performed during the hospital encounter of 09/29/2017 (from the past 24 hour(s))  I-stat troponin, ED     Status: Abnormal   Collection Time: 09/18/2017  4:31 PM  Result Value Ref Range   Troponin i, poc 5.02 (HH) 0.00 - 0.08 ng/mL   Comment NOTIFIED PHYSICIAN    Comment 3          I-stat Chem 8, ED     Status: Abnormal   Collection Time: 09/30/2017  4:33 PM  Result Value Ref Range   Sodium 134 (L) 135 - 145 mmol/L   Potassium 4.6 3.5 - 5.1 mmol/L   Chloride 106 101 - 111 mmol/L   BUN 92 (H) 6 - 20 mg/dL   Creatinine, Ser 6.80 (H) 0.44 - 1.00 mg/dL   Glucose, Bld 68 65 - 99 mg/dL   Calcium, Ion 0.91 (L) 1.15 - 1.40 mmol/L   TCO2 16 (L)  22 - 32 mmol/L   Hemoglobin 9.2 (L) 12.0 - 15.0 g/dL   HCT 27.0 (L) 36.0 - 46.0 %  APTT     Status: Abnormal   Collection Time: 09/25/2017  5:10 PM  Result Value Ref Range   aPTT 117 (H) 24 - 36 seconds  CBC with Differential     Status: Abnormal   Collection Time: 09/17/2017  5:10 PM  Result Value Ref Range   WBC 7.2 4.0 - 10.5 K/uL   RBC 3.28 (L) 3.87 - 5.11 MIL/uL   Hemoglobin 8.6 (L) 12.0 - 15.0 g/dL   HCT 27.2 (L) 36.0 - 46.0 %   MCV 82.9 78.0 - 100.0 fL   MCH 26.2 26.0 - 34.0 pg   MCHC 31.6 30.0 - 36.0 g/dL   RDW 17.4 (H) 11.5 - 15.5 %   Platelets 201 150 - 400 K/uL   Neutrophils Relative % 93 %   Lymphocytes Relative 6 %   Monocytes Relative 1 %   Eosinophils Relative 0 %   Basophils Relative 0 %   Neutro Abs 6.7 1.7 - 7.7 K/uL   Lymphs Abs 0.4 (L) 0.7 - 4.0 K/uL   Monocytes Absolute 0.1 0.1 - 1.0 K/uL   Eosinophils Absolute 0.0 0.0 - 0.7 K/uL   Basophils Absolute 0.0 0.0 - 0.1 K/uL   RBC Morphology BURR CELLS    WBC Morphology INCREASED BANDS (>20% BANDS)   Comprehensive metabolic panel     Status: Abnormal   Collection Time: 10/05/2017  5:10 PM  Result Value Ref Range   Sodium 136 135 - 145 mmol/L   Potassium 4.3 3.5 - 5.1 mmol/L   Chloride 108 101 - 111 mmol/L   CO2 12 (L) 22 - 32 mmol/L    Glucose, Bld 72 65 - 99 mg/dL   BUN 96 (H) 6 - 20 mg/dL   Creatinine, Ser 7.16 (H) 0.44 - 1.00 mg/dL   Calcium 6.6 (L) 8.9 - 10.3 mg/dL   Total Protein 4.8 (L) 6.5 - 8.1 g/dL   Albumin 1.9 (L) 3.5 - 5.0 g/dL   AST 45 (H) 15 - 41 U/L   ALT 15 14 - 54 U/L   Alkaline Phosphatase 59 38 - 126 U/L   Total Bilirubin 0.9 0.3 - 1.2 mg/dL   GFR calc non Af Amer 5 (L) >60 mL/min   GFR calc Af Amer 6 (L) >60 mL/min   Anion gap 16 (H) 5 - 15  Protime-INR     Status: Abnormal   Collection Time: 09/28/2017  5:10 PM  Result Value Ref Range   Prothrombin Time 21.3 (H) 11.4 - 15.2 seconds   INR 1.86   Troponin I     Status: Abnormal   Collection Time: 09/25/2017  5:10 PM  Result Value Ref Range   Troponin I 4.95 (HH) <0.03 ng/mL  Lipid panel     Status: None   Collection Time: 10/14/2017  5:13 PM  Result Value Ref Range   Cholesterol 77 0 - 200 mg/dL   Triglycerides 70 <150 mg/dL   HDL 43 >40 mg/dL   Total CHOL/HDL Ratio 1.8 RATIO   VLDL 14 0 - 40 mg/dL   LDL Cholesterol 20 0 - 99 mg/dL   Dg Chest 2 View  Result Date: 10/03/2017 CLINICAL DATA:  Bilateral lower extremity swelling. EXAM: CHEST - 2 VIEW COMPARISON:  07/29/2017 FINDINGS: 1621 hours. The cardio pericardial silhouette is enlarged. Patchy airspace disease right upper lobe has progressed in the interval. Small  right pleural effusion is progressive. Interstitial markings are diffusely coarsened with chronic features. The visualized bony structures of the thorax are intact. Telemetry leads overlie the chest. IMPRESSION: Marked enlargement of the cardiopericardial silhouette, pericardial effusion not excluded. Progression of patchy airspace disease in the right lung with increasing small right pleural effusion. Electronically Signed   By: Misty Stanley M.D.   On: 09/16/2017 16:40    ASSESSMENT: 83 yo woman presenting with STEMI. However, given her wishes (confirmed with her son) of DNAR/hospice and symptom management only, especially with CKD,  will not urgently take to cath lab.   PLAN/DISCUSSION: I spoke at length to the patient and her son. She is clearly having an MI, despite lack of chest pain. Her kidney function is worse than prior (Cr 7.16), her albumin is 1.9, her Hgb/Hct is down from prior (likely dilutional plus anemia of chronic disease).  Her smear shows Burr cells, and her BUN is 96. This paints a picture of worsening cardiac and renal failure with congestion and low albumin.  Regarding her kidneys--her admission in 07/2016 has notes of extensive conversations with nephrology, and the conclusion was no dialysis. Prior notes from Dr. Johnsie Cancel also discussed potential cath at time of previous NSTEMI, but given risks this was declined.   We discussed goals of care. Cath carries an exceptionally high risk given her acute on chronic systolic heart failure, her kidney function, and her breathing status. Given the risk of renal failure, intubation, lethal heart rhythms, and other complications, patient and her son agree to continue with prior plan to not pursue cath and to continue DNAR/hospice status. However, patient's son does not feel that he can manage her at home in her current state. He would like her to be medically managed at this time until a plan for home care can be put into place. He wishes to treat her MI medically and attempt to improve her lower extremity swelling.  Given her age, weight, gender, prior history of stroke, and baseline elevated PTT, she is at increased risk of intracranial hemorrhage, and therefore lytics also are high risk for treatment of STEMI.  She denies any active bleeding. Her H/H is low, which I suspect is both dilutional from her heart failure and also from her anemia of chronic disease. Given that her son's wish (which she asked to honor) is for medical treatment, recommend aspirin, heparin, and statin. If she further drops her H/H on heparin, or if she has any frank bleeding, would discontinue  heparin. If plan is for hospice, these medications can be readdressed.  I am concerned that she is in progressive heart failure. Her home meds show carvedilol 25 mg daily. I would start with a low dose metoprolol to avoid beta blocker withdrawal, but I suspect her tachycardia may be somewhat compensatory, so I would be cautious.The equivalent dose of carvedilol would be 100 mg metoprolol BID, which is max dose. I would start much lower, 12.5 mg metoprolol BID, and titrate up gradually as her BP allows. If her systolic falls below 90 I would hold all beta blockers. She unfortunately may progress to cardiogenic shock, especially with a STEMI, so if her urine output drops or her extremities become cool would stop the beta blocker.  She needs diuresis, but her kidney function may make it difficult. She was on torsemide 20 mg 4 times daily at home, though it is unclear what she was actually taking. I think her rise in Cr is worsened by her heart  failure, and the hope would be that this will improve with diuresis. I would start with 40 mg IV furosemide. If she does not have brisk urine output with this within 2 hours I would double the dose.   She is extremely tenuous, and though I tried to express this, I am not sure that the son has a full understanding. I would contact the hospice team to discuss how best we can manage her symptoms. Given that there are no plans for intervention in the cath lab, symptom and volume management are the goals.   The ER team will speak to the hospitalist about admission for symptom/volume management, but again I recommend contacting hospice to clarify the plan for care.  Please let us know if we can be of additional assistance.  Buford Dresser, MD, PhD, overnight cardiology provider

## 2017-09-20 NOTE — ED Provider Notes (Signed)
[facility] DEPT Provider Note   CSN: [id number] Arrival date & time: 09/22/2017  1525     History   Chief Complaint Chief Complaint  Patient presents with  . Leg Swelling    bilateral    HPI [name] is a 83 y.o. female.  HPI  83 year old female with a history of COPD, chronic kidney disease, systolic/diastolic CHF and coronary disease presents with leg swelling.  She is an overall difficult historian but states that she has been having the leg swelling for a while and it is progressively worse.  She is now no longer able to walk due to how swollen her legs have become over the last 1 week.  She is also had abdominal swelling and shortness of breath.  She denies significant cough.  She denies chest pain.  States she has been compliant with her Demadex.  Past Medical History:  Diagnosis Date  . Anemia   . Arthritis   . CAD (coronary artery disease)   . CAD (coronary artery disease)    a. Stents in [facility] 2001 and cath 2008 with possible balloon or stent at [facility] (not available in [place]).   . Cancer ([place])    scalp  . Chronic combined systolic and diastolic CHF (congestive heart failure) ([place])    a. EF 45-50% in 4098. b. Mixed systolic/diastolic - EF 11-91% by echo 2014, 35% by nuc.  . CKD (chronic kidney disease), stage IV ([place]) 08/29/2012  . COPD (chronic obstructive pulmonary disease) ([place])   . Depression   . Gastrointestinal hemorrhage    a. Pt reports she has h/o this many years ago, requiring 2 units of blood. Thinks it was in [place] but records not available. She does not know why this happened.  Marland Kitchen GERD (gastroesophageal reflux disease)   . H/O medication noncompliance    a. Per notes, due to cost.   . Headache(784.0)   . Hemorrhoid    a. Mild hemorrhoidal bleeding per previous DC note.  Marland Kitchen Hx of peptic ulcer   . Hypertension   . Myocardial infarction ([place])   . Nephrolithiasis   . On home oxygen therapy    "use it  only when I need it; not very much" (06/12/2016)  . Osteoarthritis, generalized   . Stroke [facility])    a. 2008: acute stroke in left centrum ovale. 06/2010: hemorrhagic stroke.    Patient Active Problem List   Diagnosis Date Noted  . Lung mass 08/01/2016  . Malnutrition of moderate degree 08/01/2016  . UTI (urinary tract infection) 07/31/2016  . HCAP (healthcare-associated pneumonia) 07/31/2016  . Acute pulmonary edema ([place]) 06/12/2016  . Hypertensive emergency 06/12/2016  . Metabolic acidosis 47/82/9562  . Chronic anemia 06/12/2016  . CKD (chronic kidney disease) stage 5, GFR less than 15 ml/min (HCC) 06/12/2016  . Acute respiratory failure ([place]) 06/12/2016  . Goals of care, counseling/discussion   . Palliative care by specialist   . Acute on chronic combined systolic and diastolic heart failure ([place])   . Community acquired pneumonia   . Palliative care encounter   . Advanced directives, counseling/discussion   . Encounter for hospice care discussion   . CAP (community acquired pneumonia) 08/01/2015  . Acute systolic congestive heart failure, NYHA class 4 ([place]) 02/22/2014  . NSTEMI (non-ST elevated myocardial infarction) ([place]) 02/22/2014  . 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 CHF exacerbation ([place]) 02/20/2014  .  Diastolic CHF, acute on chronic (HCC) 02/20/2014  . Chronic obstructive asthma, unspecified 09/13/2012  . Renal dysfunction- Gd 4 08/29/2012  . Hypokalemia 08/29/2012  . Acute exacerbation of CHF (congestive heart failure) ([place]) 08/25/2012  . HYPERTENSION, BENIGN ESSENTIAL, LABILE 07/24/2010  . CAD 07/24/2010  . CEREBROVASCULAR ACCIDENT 07/24/2010  . GASTROINTESTINAL HEMORRHAGE 07/24/2010  . NEPHROLITHIASIS 07/24/2010  . OSTEOARTHRITIS, GENERALIZED 07/24/2010  . PEPTIC ULCER DISEASE, HX OF 07/24/2010    Past Surgical History:  Procedure Laterality Date  . ABDOMINAL  HYSTERECTOMY    . APPENDECTOMY    . CHOLECYSTECTOMY    . COLON SURGERY    . COLONOSCOPY  07/18/10  . EYE SURGERY    . MELANOMA EXCISION  1989   distal right LE   . TONSILLECTOMY       OB History   None      Home Medications    Prior to Admission medications   Medication Sig Start Date End Date Taking? Authorizing Provider  amLODipine (NORVASC) 10 MG tablet Take 10 mg by mouth daily.   Yes [provider]  aspirin 81 MG chewable tablet Chew 81 mg by mouth daily.   Yes [provider]  azithromycin (ZITHROMAX) 250 MG tablet Take 1 tablet daily. 07/29/17  Yes Lacroce, Hulen Shouts, MD  carvedilol (COREG) 25 MG tablet Take 25 mg by mouth 2 (two) times daily with a meal.   Yes [provider]  cefdinir (OMNICEF) 300 MG capsule Take 1 capsule (300 mg total) by mouth daily. 07/29/17  Yes Lacroce, Hulen Shouts, MD  isosorbide mononitrate (IMDUR) 60 MG 24 hr tablet Take 60 mg by mouth daily.   Yes [provider]  ondansetron (ZOFRAN) 4 MG tablet Take 1 tablet (4 mg total) by mouth every 8 (eight) hours as needed for nausea or vomiting. 07/29/17  Yes Lacroce, Hulen Shouts, MD  permethrin (PERMETHRIN LICE TREATMENT) 1 % lotion Apply to affected area once 07/29/17 07/28/18 Yes Lacroce, Hulen Shouts, MD  torsemide (DEMADEX) 20 MG tablet Take 20 mg by mouth 4 (four) times daily.   Yes [provider]    Family History Family History  Problem Relation Age of Onset  . Diabetes Mother   . Hypertension Mother   . Lung cancer Father   . Diabetes Sister   . Cancer Son        breast  . Lung cancer Son   . Diabetes Sister   . Lung cancer Brother     Social History Social History   Tobacco Use  . Smoking status: Current Every Day Smoker    Packs/day: 0.12    Years: 60.00    Pack years: 7.20    Types: Cigarettes  . Smokeless tobacco: Never Used  Substance Use Topics  . Alcohol use: No  . Drug use: No     Allergies   Patient has no known  allergies.   Review of Systems Review of Systems  Constitutional: Negative for fever.  Respiratory: Positive for shortness of breath. Negative for cough.   Cardiovascular: Positive for leg swelling. Negative for chest pain.  Gastrointestinal: Positive for abdominal distention.  All other systems reviewed and are negative.    Physical Exam Updated Vital Signs BP 106/65 (BP Location: Right Arm)   Pulse (!) 101   Temp (!) 97.4 F (36.3 C) (Oral)   Resp (!) 34   Ht 5\' 2"  (1.575 m)   Wt 63.5 kg (140 lb)   SpO2 99%   BMI 25.61  kg/m   Physical Exam  Constitutional: She is oriented to person, place, and time. She appears well-developed.  Frail, chronically ill appearing  HENT:  Head: Normocephalic and atraumatic.  Right Ear: External ear normal.  Left Ear: External ear normal.  Nose: Nose normal.  Mouth/Throat: Mucous membranes are dry.  Eyes: Right eye exhibits no discharge. Left eye exhibits no discharge.  Cardiovascular: Normal rate, regular rhythm and normal heart sounds.  Pulmonary/Chest: Effort normal. No accessory muscle usage. Tachypnea noted. She has decreased breath sounds in the left lower field.  Abdominal: Soft. There is no tenderness.  Musculoskeletal: She exhibits edema.  Diffusely swollen lower extremities, knees down, bilaterally. R>L. Right sided is particularly painful with minimal touch. Pitting edema  Neurological: She is alert and oriented to person, place, and time.  Skin: Skin is warm and dry. She is not diaphoretic.  Nursing note and vitals reviewed.    ED Treatments / Results  Labs (all labs ordered are listed, but only abnormal results are displayed) Labs Reviewed  I-STAT CHEM 8, ED - Abnormal; Notable for the following components:      Result Value   Sodium 134 (*)    BUN 92 (*)    Creatinine, Ser 6.80 (*)    Calcium, Ion 0.91 (*)    TCO2 16 (*)    Hemoglobin 9.2 (*)    HCT 27.0 (*)    All other components within normal limits  I-STAT  TROPONIN, ED - Abnormal; Notable for the following components:   Troponin i, poc 5.02 (*)    All other components within normal limits  COMPREHENSIVE METABOLIC PANEL  BRAIN NATRIURETIC PEPTIDE  TROPONIN I  CBC WITH DIFFERENTIAL/PLATELET  PROTIME-INR  APTT  LIPID PANEL    EKG EKG Interpretation  Date/Time:  Saturday September 20 2017 16:14:04 EDT Ventricular Rate:  97 PR Interval:    QRS Duration: 122 QT Interval:  382 QTC Calculation: 486 R Axis:   -28 Text Interpretation:  Sinus rhythm Atrial premature complex Probable left atrial enlargement IVCD, consider atypical LBBB ** ** ACUTE MI / STEMI ** ** Confirmed by Sherwood Gambler [phone]) on 09/22/2017 4:18:09 PM   Radiology Dg Chest 2 View  Result Date: 10/13/2017 CLINICAL DATA:  Bilateral lower extremity swelling. EXAM: CHEST - 2 VIEW COMPARISON:  07/29/2017 FINDINGS: 1621 hours. The cardio pericardial silhouette is enlarged. Patchy airspace disease right upper lobe has progressed in the interval. Small right pleural effusion is progressive. Interstitial markings are diffusely coarsened with chronic features. The visualized bony structures of the thorax are intact. Telemetry leads overlie the chest. IMPRESSION: Marked enlargement of the cardiopericardial silhouette, pericardial effusion not excluded. Progression of patchy airspace disease in the right lung with increasing small right pleural effusion. Electronically Signed   By: Misty Stanley M.D.   On: 09/22/2017 16:40    Procedures .Critical Care Performed by: Sherwood Gambler, MD Authorized by: Sherwood Gambler, MD   Critical care provider statement:    Critical care time (minutes):  30   Critical care time was exclusive of:  Separately billable procedures and treating other patients   Critical care was necessary to treat or prevent imminent or life-threatening deterioration of the following conditions:  Cardiac failure and circulatory failure   Critical care was time spent  personally by me on the following activities:  Development of treatment plan with patient or surrogate, discussions with consultants, evaluation of patient's response to treatment, examination of patient, obtaining history from patient or surrogate, ordering and performing treatments  and interventions, ordering and review of laboratory studies, ordering and review of radiographic studies, pulse oximetry, re-evaluation of patient's condition and review of old charts   (including critical care time)  Medications Ordered in ED Medications  0.9 %  sodium chloride infusion ( Intravenous New Bag/Given 09/23/2017 1636)  aspirin chewable tablet 324 mg (324 mg Oral Given 10/06/2017 1630)  heparin injection 3,800 Units (3,800 Units Intravenous Given 10/09/2017 1631)     Initial Impression / Assessment and Plan / ED Course  I have reviewed the triage vital signs and the nursing notes.  Pertinent labs & imaging results that were available during my care of the patient were reviewed by me and considered in my medical decision making (see chart for details).     4:51 PM ECG shows lateral STEMI.  This is not totally consistent with her presentation of progressive CHF but certainly is concerning, especially with her shortness of breath and overall ill appearance.  She is tachypneic but maintaining her oxygen I do not think she needs emergent intubation at this time.  I have discussed with Dr. Angelena Form, who states that she may not be the best catheterization candidate given her chronic kidney disease with a creatinine of about 6, but that she needs to be emergently transferred to [facility] ER where Dr. Stanford Breed will evaluate her.  I discussed with the EDP, Dr. Melina Copa, who is aware of the patient's transfer to the ER.  Currently lab work is pending.  She will be given aspirin and IV heparin.  Final Clinical Impressions(s) / ED Diagnoses   Final diagnoses:  ST elevation myocardial infarction (STEMI), unspecified artery  [facility])    ED Discharge Orders    None       Sherwood Gambler, MD 10/04/2017 1651

## 2017-09-20 NOTE — ED Provider Notes (Signed)
For full HPI see ed note for [facility]  83 year old female with COPD CKD CHF CAD and chronic renal insufficiency transferred here from [place] long after she arrived with complaints of leg swelling and shortness of breath.  She had an obvious elevation of troponin and EKG demonstrating ST elevations laterally.  Cardiology was consulted and recommended that she be transferred.  She received 324 of aspirin and a heparin bolus.  Currently she is normotensive slightly tachypneic satting 91% on nasal cannula.  Heart rates above 100 and blood pressure is about 100.  She is complaining of mild shortness of breath and no chest pain. soft abdomen.  Lungs have coarse rub throughout.  She had market edema on her bilateral lower extremities right greater than left with ecchymosis and a few petechiae.  Right leg is quite tender.  I am ordering her a heparin drip and cardiology has been consulted and will evaluate the patient in the ED.  As of now they do not feel she would be a Cath Lab candidate.  Clinical Course as of Sep 23 1135  Sat Sep 20, 2017  [facility] and to see the patient and talking family now regarding consideration for Cath Lab.   [MB]  [id number] Cardiology attending talked to the son who reiterated that she would not want cardiac cath.  He is going to be driving up to the ED and we will reconnect and have further discussion.  Apparently the patient is on hospice.    [MB]  35 Son here now and for patient care reviewed by cardiology.  They still declined cardiac cath.  We will work on admitting her to the hospital for management of her MI and ultimately will need to clarify her hospice situation.    [MB]  1914 Discussed with Dr. Blaine Hamper from hospitalist service and he will admit the patient.  To confirm I reviewed with the son and the patient is DNR/DNI.  She and her son have declined catheterization when offered by cardiology.   [MB]    Clinical Course User Index [MB] Hayden Rasmussen, MD   CRITICAL  CARE Performed by: Hayden Rasmussen   Total critical care time: 30 minutes  Critical care time was exclusive of separately billable procedures and treating other patients.  Critical care was necessary to treat or prevent imminent or life-threatening deterioration.  Critical care was time spent personally by me on the following activities: development of treatment plan with patient and/or surrogate as well as nursing, discussions with consultants, evaluation of patient's response to treatment, examination of patient, obtaining history from patient or surrogate, ordering and performing treatments and interventions, ordering and review of laboratory studies, ordering and review of radiographic studies, pulse oximetry and re-evaluation of patient's condition.   Hayden Rasmussen, MD 09/22/17 1137

## 2017-09-20 NOTE — ED Notes (Signed)
Pt son updated by Cardiologist. Pt is a hospice pt. Son in route. Pt given warm blankets, denies chest pain.

## 2017-09-20 NOTE — ED Notes (Signed)
Dr.NIU at bedside-Monique,RN  

## 2017-09-20 NOTE — ED Notes (Signed)
RN AND MD NOTIFIED OF PATIENT'S TROPONIN LEVEL OF 5.05

## 2017-09-21 DIAGNOSIS — R570 Cardiogenic shock: Secondary | ICD-10-CM | POA: Diagnosis present

## 2017-09-21 DIAGNOSIS — N3000 Acute cystitis without hematuria: Secondary | ICD-10-CM

## 2017-09-21 DIAGNOSIS — K529 Noninfective gastroenteritis and colitis, unspecified: Secondary | ICD-10-CM

## 2017-09-21 LAB — CLOSTRIDIUM DIFFICILE BY PCR, REFLEXED: Toxigenic C. Difficile by PCR: NEGATIVE

## 2017-09-21 LAB — C DIFFICILE QUICK SCREEN W PCR REFLEX
C DIFFICILE (CDIFF) TOXIN: NEGATIVE
C DIFFICLE (CDIFF) ANTIGEN: POSITIVE — AB

## 2017-09-21 MED ORDER — CIPROFLOXACIN IN D5W 200 MG/100ML IV SOLN
200.0000 mg | INTRAVENOUS | Status: DC
  Administered 2017-09-21: 200 mg via INTRAVENOUS
  Filled 2017-09-21: qty 100

## 2017-09-21 MED ORDER — SODIUM CHLORIDE 0.9 % IV SOLN
2.0000 g | Freq: Once | INTRAVENOUS | Status: AC
  Administered 2017-09-21: 2 g via INTRAVENOUS
  Filled 2017-09-21: qty 20

## 2017-09-21 MED ORDER — METRONIDAZOLE IN NACL 5-0.79 MG/ML-% IV SOLN: 500.0000 mg | Freq: Three times a day (TID) | INTRAVENOUS | Status: DC

## 2017-09-21 MED ORDER — METRONIDAZOLE IN NACL 5-0.79 MG/ML-% IV SOLN: 500.0000 mg | Freq: Once | INTRAVENOUS | Status: DC

## 2017-10-15 NOTE — Progress Notes (Signed)
Paged rapid response/Puja Patel to make her aware of Pt's deconditioning status.

## 2017-10-15 NOTE — Significant Event (Addendum)
Rapid Response Event Note  Overview: Hypoxia, Hypotension, Agonal Breathing  Initial Focused Assessment: RN called and expressed to me that the patient is hypotensive, hypoxic, and not breathing well. Patient is a DNR and RN did page the MD as well. Per RN, MD also spoke to the family as well and updated them on the patient's status. Patient is unresponsive, pupils are non reactive and L pupil appears to be fixed/dilated/llarger than R. Patient is breathing agonally, oxygenation sats cannot be read on continuous pulse oximetry, hands/feet and skin is cold, mottled, and blue. Patient was on NRB 15L prior to my arrival. HR in the 80s, pulse is weak/thready, SBP in the 60 and MAPs in the 40s.  + STEMI (current telemetry has ST changes), on heparin drip.   My clinical observation is that the patient is actively dying.  Interventions: -- RN received orders for fluids prior to my arrival -- Per RN, MD updated family and family's wishing that medical therapies be continued (labs ordered, blood cultures ordered, ABX ordered, and electrolytes are being replaced) -- NO RRT INTERVENTIONS   Plan of Care (if not transferred): -- RN to page primary service if needed.  Event Summary:   at    Call Time 200 Arrival Time 205 End Time  235  Jerimyah Vandunk R

## 2017-10-15 NOTE — Progress Notes (Signed)
The patient's RN is aware of patient's o2 sats in 30's and the patient is agonal at this time. Pt is a DNR at this time.

## 2017-10-15 NOTE — Progress Notes (Signed)
Dr Rhunette Croft, NIU called to check on Pt and also came on the floor to attend to the Pt.  DR Blaine Hamper while in Pt's room placed Telephone call to Pt's son-Charles Mccadden to update him on Pt's current status.

## 2017-10-15 NOTE — Discharge Summary (Signed)
Physician Discharge Summary  [name] W[facility]:[id number] DOB: [birth year] DOA: 10-16-17  PCP: Donald Prose, MD  Admit date: Oct 16, 2017 Discharge date: 10-17-2017  Time spent: 24 minutes  Recommendations for Outpatient Follow-up:  1. None since pt passed away   Discharge Diagnoses:  Principal Problem:   STEMI (ST elevation myocardial infarction) ([place]) Active Problems:   Acute on chronic combined systolic and diastolic heart failure (HCC)   CKD (chronic kidney disease) stage 5, GFR less than 15 ml/min (HCC)   UTI (urinary tract infection)   COPE (chronic obstructive pulmonary emphysema) (HCC)   Stroke (HCC)   Normocytic anemia   Tobacco abuse   Hypotension   Essential hypertension   Abdominal pain   Diarrhea   Colitis   Cardiogenic shock ([place])   Discharge Condition: death  Diet recommendation: None since pt passed away  [facility] Weights   16-Oct-2017 1550 10-16-2017 2144  Weight: 63.5 kg (140 lb) 62.8 kg (138 lb 7.2 oz)    History of present illness:   HPI: [name] is a 83 y.o. female with medical history significant of CKD-V (refused hemodialysis before), hypertension, COPD, stroke, GERD, depression, CAD, peptic ulcers, CHF with EF of 30%, anemia, tobacco abuse, hospice care, who presents with bilateral leg edema, chest pain, shortness of breath, abdominal pain, diarrhea.  Pt states that her bilateral leg edema has worsened recently. She has fluid oozing in both legs and small skin tear in the right lower leg. She also has SOB and chest pain. She cannot give detailed information about her chest pain. She coughs up greenish colored mucus. Denies fever or chills. Per his son, patient has chronic diarrhea and abdominal pain. Patient states that she had 5 watery diarrh  ea today. She denies nausea and vomiting. She also reports dysuria sometimes. She moves all extremities. No unilateral weakness, numbness or tenderness extremities. Of note, patient has stage V chronic  kidney disease, and he refused dialysis in the past. Per his son, pt was told to have 6 months to live in the past. She  was under hospice care before, but hospice care team stopped seeing pt recently.  ED Course: pt was found to have STEMI, troponin4.5, WBC 7.2, INR 1.86, PTT 117, renal function close to baseline, temperature 97.4, hypotension with blood pressure 83/65, which improved to 96/45 out 500 mL normal saline bolus,tachycardia, tachypnea, oxygen saturation 85% on room air. Pending UA. Pt is but admitted to stepdown as inpatient. Cardiology was consulted. Patient is a DO NOT RESUSCITATE.   Hospital Course:   pt is admitted due to multiple acute issues including STEMI, cardiogenic shock, acute on chronic combined systolic and diastolic heart failure, UTI, diarrhea due to colitis, and chronic issues including COPD, CKD-V, stroke, anemia, hypertension and tobacco abuse. Pt's trop is upto 10.59. Cardiology was consulted. Dr. Harrell Gave had lengthy discussion with patient's son who is the power of attorney. Due to multiple chronic issues including CKD-V. His son wants pt be treated medically with no cardiac cath. IV heparin was started. Patient's condition deteriorated in the next several hours. She developed cardiogenic shock. Her CHF exacerbation was treated with IV lasix. She also has UTI and colitis. Two antibodies were ordered, ciprofloxacin was given. Flagyl was not started yet before pt passed away. Pt was also given several doses of normal saline bolus for hypotension. Unfortunately patient did not response to treatments, and deteriorated quickly. She passed away peacefully. After patient is admitted, I personally evaluated patient on the floor again, and  called his son to have given the update to him. His son has been very supportive with understanding the pt was very likely not be able to make it.   Procedures:  none  Consultations:  Cardiology  Discharge Exam: death  Discharge  Instructions: None since pt passed away  Allergies as of 2017/10/17   No Known Allergies    No Known Allergies    The results of significant diagnostics from this hospitalization (including imaging, microbiology, ancillary and laboratory) are listed below for reference.    Significant Diagnostic Studies: Ct Abdomen Pelvis Wo Contrast  Result Date: 17-Oct-2017 CLINICAL DATA:  Diffuse abdominal pain, tenderness, and diarrhea. Patient is not communicate of. EXAM: CT ABDOMEN AND PELVIS WITHOUT CONTRAST TECHNIQUE: Multidetector CT imaging of the abdomen and pelvis was performed following the standard protocol without IV contrast. COMPARISON:  None. FINDINGS: Examination is technically limited due to motion artifact. Lower chest: Small right pleural effusion with atelectasis or consolidation in the right lung base. This could indicate pneumonia. Cardiac enlargement. Coronary artery calcifications. Residual contrast material in the esophagus could indicate reflux or dysmotility. Hepatobiliary: No focal liver abnormality is seen. Status post cholecystectomy. No biliary dilatation. Pancreas: Unremarkable. No pancreatic ductal dilatation or surrounding inflammatory changes. Spleen: Normal in size without focal abnormality. Adrenals/Urinary Tract: No adrenal gland nodules. Kidneys are atrophic bilaterally. No hydronephrosis or hydroureter. Bladder is decompressed with a Foley catheter. Stomach/Bowel: Stomach, small bowel, and colon are not abnormally distended. Under distention limits the evaluation, but there is suggestion of mild colonic wall thickening which may indicate evidence of colitis. No pneumatosis. No definite small bowel wall thickening. Scattered diverticula in the sigmoid colon without evidence of diverticulitis. Appendix is not identified. Vascular/Lymphatic: Diffuse aortic calcification with calcification of all major branch vessels including celiac axis, superior mesenteric artery, bilateral renal  arteries, and iliac and common femoral arteries. Calcific stenosis of the mesenteric arteries is not excluded. There is an abdominal aortic aneurysm measuring 4.2 cm in maximal AP diameter. No retroperitoneal hematoma. Reproductive: Status post hysterectomy. No adnexal masses. Other: No free air or free fluid in the abdomen. Musculoskeletal: Degenerative changes in the spine. No destructive bone lesions. IMPRESSION: 1. Small right pleural effusion with consolidation in the right lung base may be indicating pneumonia. 2. Cardiac enlargement. 3. Residual contrast material in the esophagus could indicate reflux or dysmotility. 4. Possible colonic wall thickening may indicate colitis. Nonspecific etiology. 5. Diffuse vascular calcifications. Can't exclude mesenteric stenosis. 6. Abdominal aortic aneurysm measuring 4.2 cm in maximal diameter. 7. Scattered colonic diverticula without evidence of diverticulitis. Electronically Signed   By: Lucienne Capers M.D.   On: 2017-10-17 00:07   Dg Chest 2 View  Result Date: 10/05/2017 CLINICAL DATA:  Bilateral lower extremity swelling. EXAM: CHEST - 2 VIEW COMPARISON:  07/29/2017 FINDINGS: 1621 hours. The cardio pericardial silhouette is enlarged. Patchy airspace disease right upper lobe has progressed in the interval. Small right pleural effusion is progressive. Interstitial markings are diffusely coarsened with chronic features. The visualized bony structures of the thorax are intact. Telemetry leads overlie the chest. IMPRESSION: Marked enlargement of the cardiopericardial silhouette, pericardial effusion not excluded. Progression of patchy airspace disease in the right lung with increasing small right pleural effusion. Electronically Signed   By: Misty Stanley M.D.   On: 10/14/2017 16:40    Microbiology: No results found for this or any previous visit (from the past 240 hour(s)).   Labs: Basic Metabolic Panel: Recent Labs  Lab 10/05/2017 1633 09/22/2017 1710  NA 134*  136  K 4.6 4.3  CL 106 108  CO2  --  12*  GLUCOSE 68 72  BUN 92* 96*  CREATININE 6.80* 7.16*  CALCIUM  --  6.6*   Liver Function Tests: Recent Labs  Lab 09/24/2017 1710  AST 45*  ALT 15  ALKPHOS 59  BILITOT 0.9  PROT 4.8*  ALBUMIN 1.9*   Recent Labs  Lab 09/16/2017 2035  LIPASE 16   No results for input(s): AMMONIA in the last 168 hours. CBC: Recent Labs  Lab 09/23/2017 1633 10/02/2017 1710  WBC  --  7.2  NEUTROABS  --  6.7  HGB 9.2* 8.6*  HCT 27.0* 27.2*  MCV  --  82.9  PLT  --  201   Cardiac Enzymes: Recent Labs  Lab 09/16/2017 1710 09/26/2017 2009  TROPONINI 4.95* 10.59*   BNP: BNP (last 3 results) Recent Labs    09/25/2017 1713  BNP >4,500.0*    ProBNP (last 3 results) No results for input(s): PROBNP in the last 8760 hours.  CBG: No results for input(s): GLUCAP in the last 168 hours.     Signed:  Ivor Costa  [facility] 10/08/2017, 4:28 AM

## 2017-10-15 NOTE — Progress Notes (Signed)
TC placed to Pt's Son- Alessa Mazur @ [phone] advising of Pt's status, Pt's O2 Sats in the 30's.

## 2017-10-15 NOTE — Progress Notes (Signed)
In room Pt's HR went to Asystole on the monitor, HR, lung sounds and pulse checked for 2 mins by Corie Chiquito, RN and Samuel Jester, RN.  Family at the bedside, DR X. Niu paged, [place] [facility] notified, no is [phone]. Family support given.

## 2017-10-15 DEATH — deceased

## 2018-12-05 IMAGING — CT CT ABD-PELV W/O CM
2 of 5 series · 16 of 46 positions shown, 18 images · non-contrast
Comparison: None.

CLINICAL DATA: Diffuse abdominal pain, tenderness, and diarrhea.
Patient is not communicate of.

EXAM:
CT ABDOMEN AND PELVIS WITHOUT CONTRAST
TECHNIQUE: Multidetector CT imaging of the abdomen and pelvis was performed
following the standard protocol without IV contrast.

[Series 3: a/p w/o 5mm · axial · non-contrast · 0.81mm/px · z∈[+923,+1288]mm · 13 of 81 slices shown, 15 images]
[im 4/81  soft-tissue]
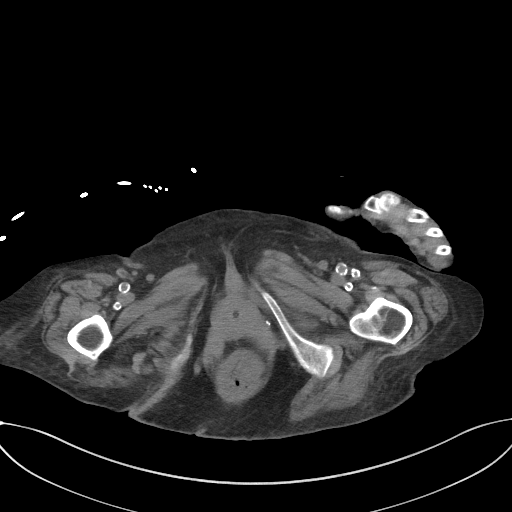
[im 4/81  bone]
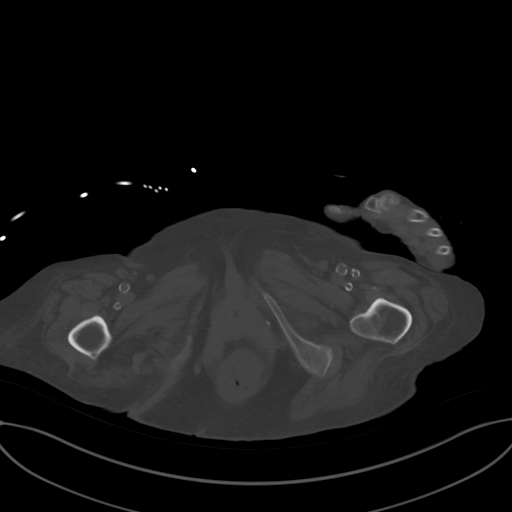
[im 10/81  soft-tissue]
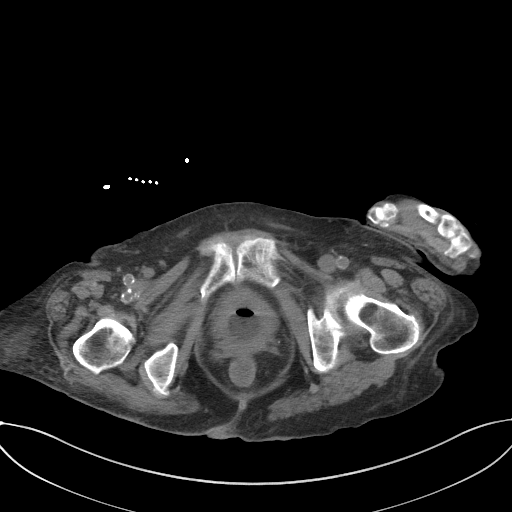
[im 17/81  soft-tissue]
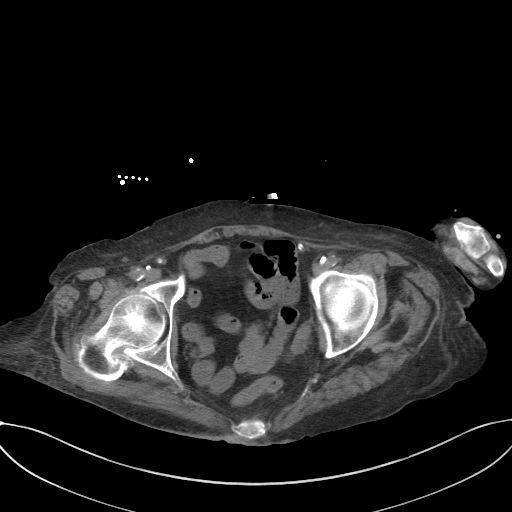
[im 23/81  soft-tissue]
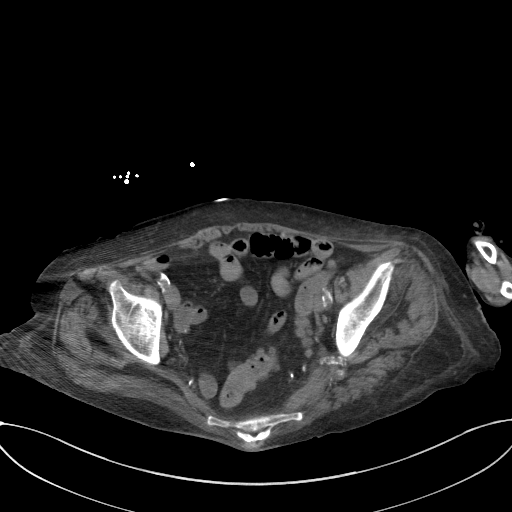
[im 29/81  soft-tissue]
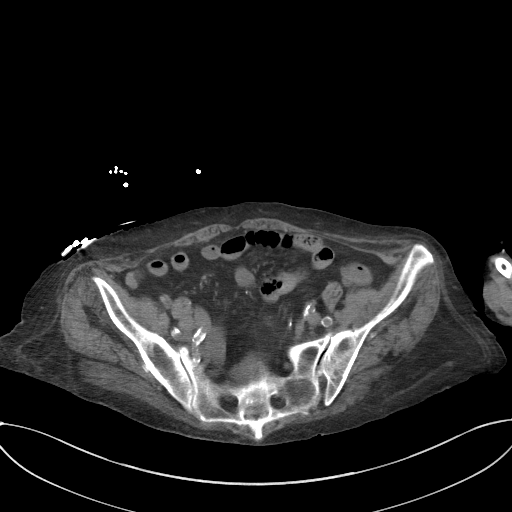
[im 36/81  soft-tissue]
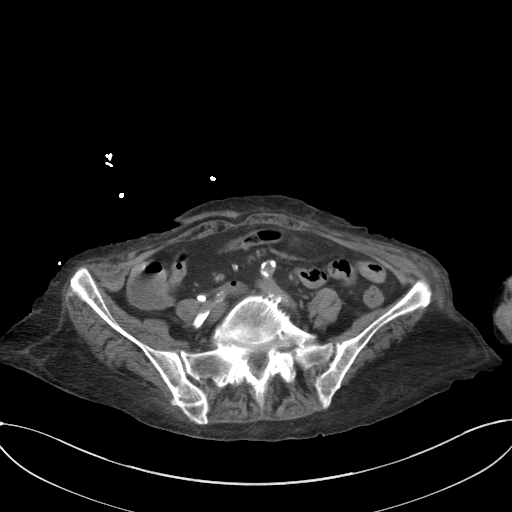
[im 42/81  soft-tissue]
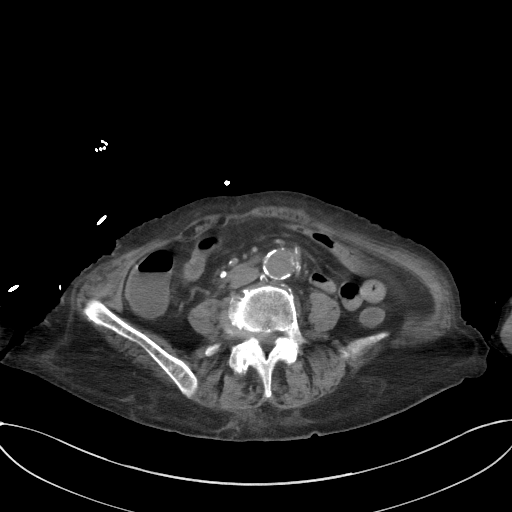
[im 45/81  soft-tissue]
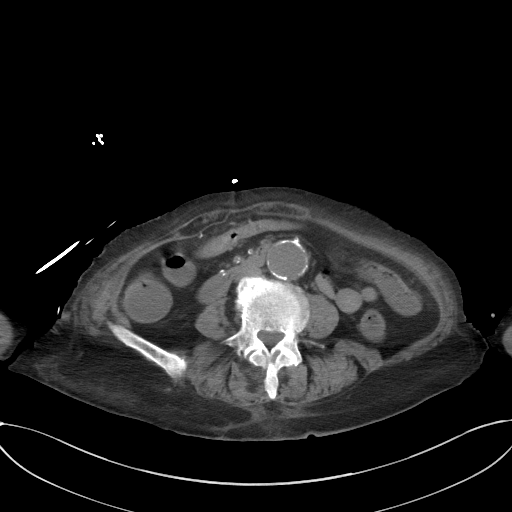
[im 52/81  soft-tissue]
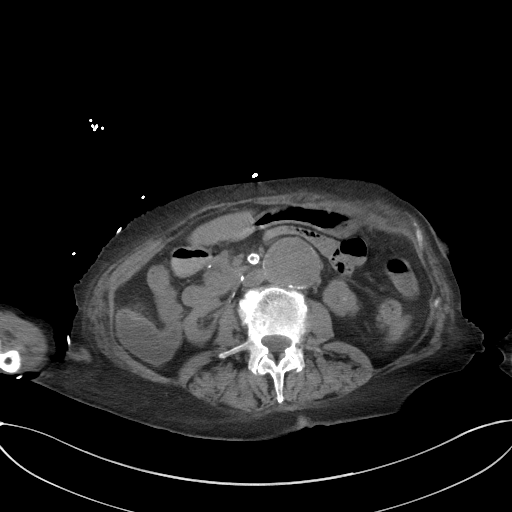
[im 52/81  bone]
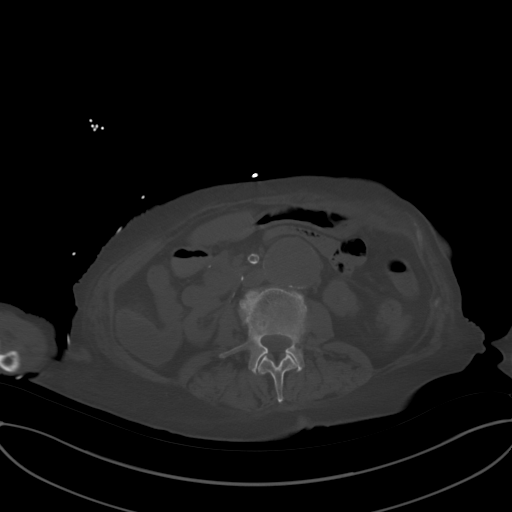
[im 58/81  soft-tissue]
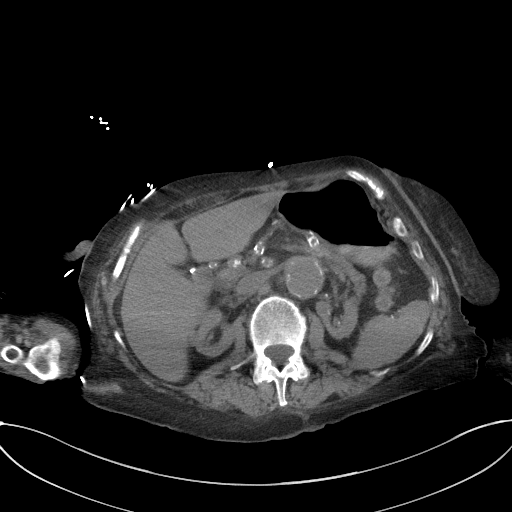
[im 65/81  soft-tissue]
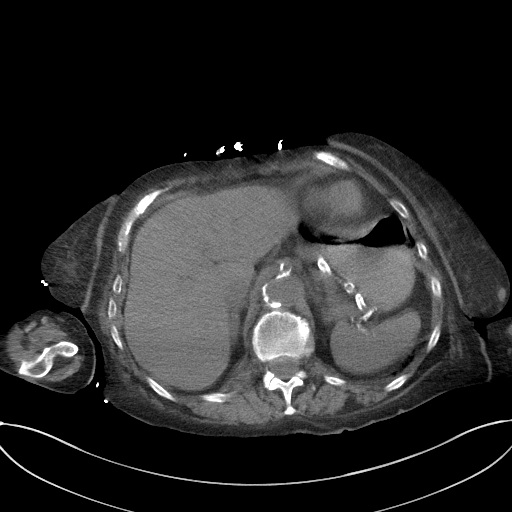
[im 71/81  soft-tissue]
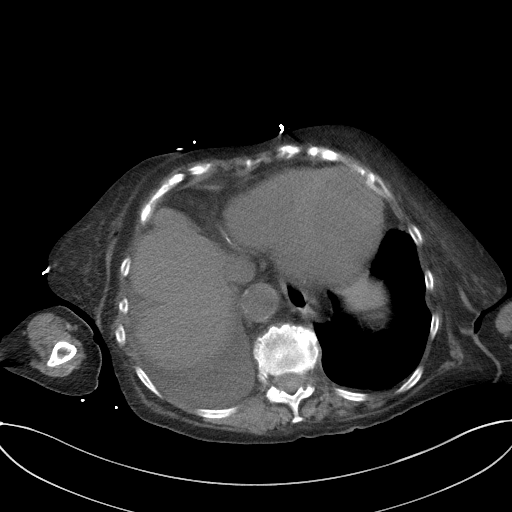
[im 77/81  soft-tissue]
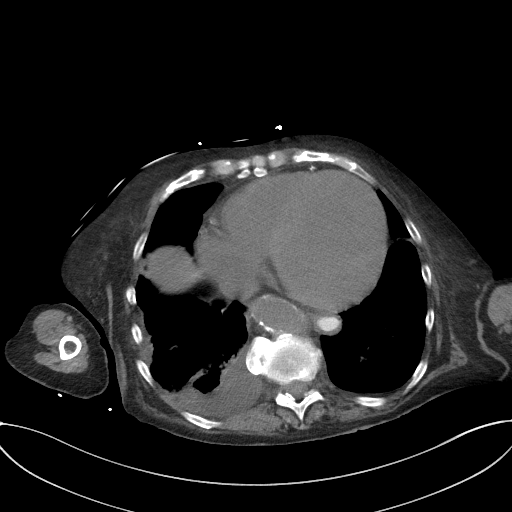

[Series 6: a/p w/o cor · coronal · non-contrast · 0.80mm/px · 3 of 118 slices shown]
[im 40/118  soft-tissue]
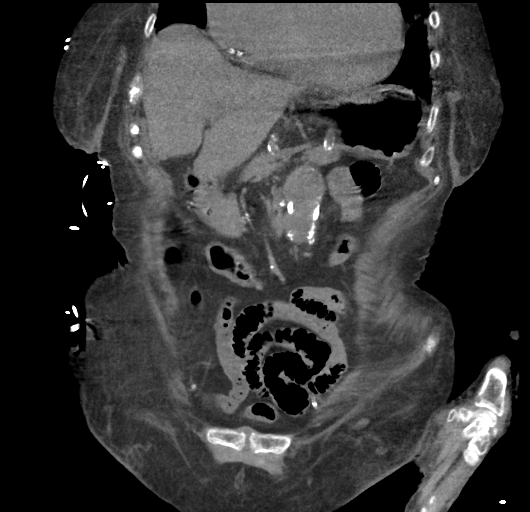
[im 53/118  soft-tissue]
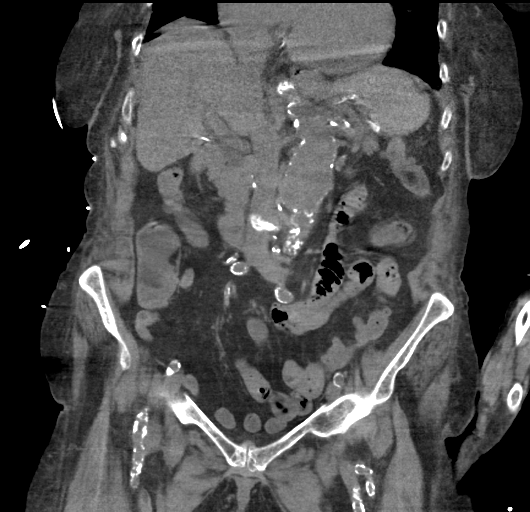
[im 66/118  soft-tissue]
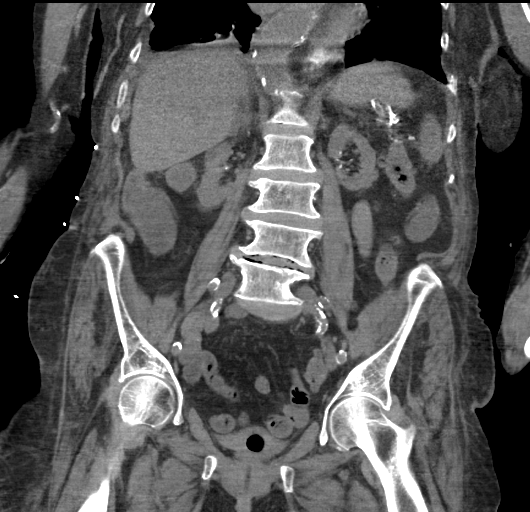

[16 of 46 positions shown; findings below may reference images not displayed]

FINDINGS: Examination is technically limited due to motion artifact.

Lower chest: Small right pleural effusion with atelectasis or
consolidation in the right lung base. This could indicate pneumonia.
Cardiac enlargement. Coronary artery calcifications. Residual
contrast material in the esophagus could indicate reflux or
dysmotility.

Hepatobiliary: No focal liver abnormality is seen. Status post
cholecystectomy. No biliary dilatation.

Pancreas: Unremarkable. No pancreatic ductal dilatation or
surrounding inflammatory changes.

Spleen: Normal in size without focal abnormality.

Adrenals/Urinary Tract: No adrenal gland nodules. Kidneys are
atrophic bilaterally. No hydronephrosis or hydroureter. Bladder is
decompressed with a Foley catheter.

Stomach/Bowel: Stomach, small bowel, and colon are not abnormally
distended. Under distention limits the evaluation, but there is
suggestion of mild colonic wall thickening which may indicate
evidence of colitis. No pneumatosis. No definite small bowel wall
thickening. Scattered diverticula in the sigmoid colon without
evidence of diverticulitis. Appendix is not identified.

Vascular/Lymphatic: Diffuse aortic calcification with calcification
of all major branch vessels including celiac axis, superior
mesenteric artery, bilateral renal arteries, and iliac and common
femoral arteries. Calcific stenosis of the mesenteric arteries is
not excluded. There is an abdominal aortic aneurysm measuring 4.2 cm
in maximal AP diameter. No retroperitoneal hematoma.

Reproductive: Status post hysterectomy. No adnexal masses.

Other: No free air or free fluid in the abdomen.

Musculoskeletal: Degenerative changes in the spine. No destructive
bone lesions.
IMPRESSION: 1. Small right pleural effusion with consolidation in the right lung
base may be indicating pneumonia.
2. Cardiac enlargement.
3. Residual contrast material in the esophagus could indicate reflux
or dysmotility.
4. Possible colonic wall thickening may indicate colitis.
Nonspecific etiology.
5. Diffuse vascular calcifications. Can't exclude mesenteric
stenosis.
6. Abdominal aortic aneurysm measuring 4.2 cm in maximal diameter.
7. Scattered colonic diverticula without evidence of diverticulitis.
